# Patient Record
Sex: Female | Born: 1960 | Race: White | Hispanic: Yes | Marital: Married | Smoking: Former smoker
Health system: Southern US, Community
[De-identification: ages and names within clinical notes are randomized; demographics above are authoritative.]

## PROBLEM LIST (undated history)

## (undated) DIAGNOSIS — J439 Emphysema, unspecified: Secondary | ICD-10-CM

## (undated) DIAGNOSIS — B359 Dermatophytosis, unspecified: Secondary | ICD-10-CM

## (undated) DIAGNOSIS — E559 Vitamin D deficiency, unspecified: Secondary | ICD-10-CM

## (undated) DIAGNOSIS — I10 Essential (primary) hypertension: Secondary | ICD-10-CM

## (undated) DIAGNOSIS — F520 Hypoactive sexual desire disorder: Secondary | ICD-10-CM

## (undated) DIAGNOSIS — K829 Disease of gallbladder, unspecified: Secondary | ICD-10-CM

## (undated) DIAGNOSIS — N950 Postmenopausal bleeding: Secondary | ICD-10-CM

## (undated) HISTORY — PX: TUBAL LIGATION: SHX77

## (undated) HISTORY — DX: Postmenopausal bleeding: N95.0

## (undated) HISTORY — DX: Vitamin D deficiency, unspecified: E55.9

## (undated) HISTORY — PX: TONSILLECTOMY: SUR1361

## (undated) HISTORY — DX: Hypoactive sexual desire disorder: F52.0

## (undated) HISTORY — DX: Dermatophytosis, unspecified: B35.9

## (undated) HISTORY — DX: Emphysema, unspecified: J43.9

## (undated) HISTORY — DX: Disease of gallbladder, unspecified: K82.9

---

## 2004-12-20 ENCOUNTER — Ambulatory Visit: Payer: Self-pay

## 2005-12-22 ENCOUNTER — Ambulatory Visit: Payer: Self-pay

## 2007-08-09 ENCOUNTER — Ambulatory Visit: Payer: Self-pay

## 2009-05-08 ENCOUNTER — Ambulatory Visit: Payer: Self-pay

## 2010-10-06 ENCOUNTER — Ambulatory Visit: Payer: Self-pay

## 2012-06-14 ENCOUNTER — Ambulatory Visit: Payer: Self-pay | Admitting: Family Medicine

## 2012-07-02 ENCOUNTER — Emergency Department: Payer: Self-pay | Admitting: Emergency Medicine

## 2012-09-19 ENCOUNTER — Ambulatory Visit: Payer: Self-pay | Admitting: Obstetrics and Gynecology

## 2012-11-07 ENCOUNTER — Emergency Department: Payer: Self-pay | Admitting: Emergency Medicine

## 2014-04-28 ENCOUNTER — Ambulatory Visit: Payer: BC Managed Care – PPO | Admitting: Podiatry

## 2014-05-09 ENCOUNTER — Ambulatory Visit (INDEPENDENT_AMBULATORY_CARE_PROVIDER_SITE_OTHER): Payer: BC Managed Care – PPO

## 2014-05-09 ENCOUNTER — Encounter: Payer: Self-pay | Admitting: Podiatry

## 2014-05-09 ENCOUNTER — Ambulatory Visit (INDEPENDENT_AMBULATORY_CARE_PROVIDER_SITE_OTHER): Payer: BC Managed Care – PPO | Admitting: Podiatry

## 2014-05-09 VITALS — BP 145/95 | HR 88 | Resp 16

## 2014-05-09 DIAGNOSIS — Q667 Congenital pes cavus: Secondary | ICD-10-CM | POA: Diagnosis not present

## 2014-05-09 DIAGNOSIS — M2041 Other hammer toe(s) (acquired), right foot: Secondary | ICD-10-CM

## 2014-05-09 DIAGNOSIS — M216X9 Other acquired deformities of unspecified foot: Secondary | ICD-10-CM

## 2014-05-09 NOTE — Progress Notes (Signed)
Subjective:     Patient ID: Deanna Velez, female   DOB: [birth year], 54 y.o.   MRN: [id number]  HPI patient presents stating she's having significant movement of her second toe right foot with pain that's occurring in the joint secondary to the position of the toe. States that her dog between her second and third toes year ago and it's progressively been moving since that   Review of Systems  All other systems reviewed and are negative.      Objective:   Physical Exam  Constitutional: She is oriented to person, place, and time.  Cardiovascular: Intact distal pulses.   Musculoskeletal: Normal range of motion.  Neurological: She is oriented to person, place, and time.  Skin: Skin is warm.  Nursing note and vitals reviewed.  neurovascular status intact with muscle strength adequate and range of motion subtalar and midtarsal joint within normal limits. Patient's noted to have good digital perfusion is well oriented 3 with no equinus condition noted. Patient has medial and dorsal dislocation of the second toe right with dorsal redness and keratotic lesion secondary to lifting of the toe and pain in the second metatarsophalangeal joint when pressed. This is been progressive for the last year     Assessment:     Probable flexor plate stretch or tear of the second MPJ right with dorsal and medial dislocation of the second toe present with pain and deformity    Plan:     H&P and condition reviewed with patient along with x-ray. Discussed at great length the causes of this condition and discussed treatment options. Due to the fact the toe continues to move and it's becoming more and more of an issue for her she wants to have it fixed versus anything conservative. She's tried shoe gear modifications and padding without relief. Today I discussed digital fusion with metatarsal osteotomy and explained the procedure and the fact there is no guarantee this will give her good alignment. I discussed  alternative treatments and complications with this type of surgery and she understands all of this and is willing to accept risk and signs consent form after review. Patient is scheduled for outpatient surgery in the next several weeks and is encouraged to call with questions and is dispensed air fracture walker at this time

## 2014-05-09 NOTE — Progress Notes (Signed)
   Subjective:    Patient ID: Deanna Velez, female    DOB: [birth year], 54 y.o.   MRN: [id number]  HPI Comments: "I have pain in this toe"  Patient c/o aching 2nd toe right for a few months. Her dog broke this toe a year ago. Since it has been curled. Uncomfortable with shoes. Walking she notices she grasp the floor. No home treatment.  Also, questions about 1st nail right-treatment?     Review of Systems  Musculoskeletal: Positive for gait problem.  Allergic/Immunologic: Positive for food allergies.  All other systems reviewed and are negative.      Objective:   Physical Exam        Assessment & Plan:

## 2014-05-23 HISTORY — PX: TOE SURGERY: SHX1073

## 2014-06-16 ENCOUNTER — Telehealth: Payer: Self-pay | Admitting: *Deleted

## 2014-06-16 NOTE — Telephone Encounter (Signed)
Deanna Velez called stating that she is having surgery tomorrow with dr Paulla Dolly , she is wondering if she can have her prescriptions today to have filled and ready for tomorrow ?

## 2014-06-17 ENCOUNTER — Telehealth: Payer: Self-pay | Admitting: *Deleted

## 2014-06-17 DIAGNOSIS — M2041 Other hammer toe(s) (acquired), right foot: Secondary | ICD-10-CM | POA: Diagnosis not present

## 2014-06-17 DIAGNOSIS — M21541 Acquired clubfoot, right foot: Secondary | ICD-10-CM | POA: Diagnosis not present

## 2014-06-17 NOTE — Telephone Encounter (Signed)
Patient called and stated she had surgery with dr Paulla Dolly this morning and her foot feels like it is swelling and the boot is too tight. Called her back and she stated that she has been icing it , but had to do a few things when she got home from , she has been keeping it up and icing for fifteen minutes, also loosened up the bandage.  She is to stay off her feet and continue with elevation and icing . Loosen up the boot and bandage. Going to take ibuprofen in between her pain medication doses

## 2014-06-20 ENCOUNTER — Ambulatory Visit (INDEPENDENT_AMBULATORY_CARE_PROVIDER_SITE_OTHER): Payer: BC Managed Care – PPO | Admitting: Podiatry

## 2014-06-20 ENCOUNTER — Ambulatory Visit (INDEPENDENT_AMBULATORY_CARE_PROVIDER_SITE_OTHER): Payer: BC Managed Care – PPO

## 2014-06-20 ENCOUNTER — Encounter: Payer: Self-pay | Admitting: Podiatry

## 2014-06-20 VITALS — BP 137/98 | HR 77 | Resp 16

## 2014-06-20 DIAGNOSIS — M2041 Other hammer toe(s) (acquired), right foot: Secondary | ICD-10-CM | POA: Diagnosis not present

## 2014-06-21 NOTE — Progress Notes (Signed)
Subjective:     Patient ID: Deanna Velez, female   DOB: 01-30-1961, 54 y.o.   MRN: [id number]  HPI patient states I'm doing well with my right foot but it does feel like it swells and he gets painful if I been on it for periods of time   Review of Systems     Objective:   Physical Exam Neurovascular status intact muscle strength adequate with well-healing surgical sites right foot of 10 day duration. Wound edges are coapted well and the second toe is in good alignment with pin in place and minimal dorsiflexion of the second toe noted    Assessment:     Doing well post osteotomy second metatarsal digital fusion digit 2 right    Plan:     Reviewed x-rays and explained the importance of plantar flexing the second toe and dispensed digital splint. Continue with elevation compression immobilization and reappoint in approximately 3 weeks or earlier if any issues should occur

## 2014-07-04 ENCOUNTER — Ambulatory Visit (INDEPENDENT_AMBULATORY_CARE_PROVIDER_SITE_OTHER): Payer: BC Managed Care – PPO | Admitting: Podiatry

## 2014-07-04 ENCOUNTER — Encounter: Payer: BC Managed Care – PPO | Admitting: Podiatry

## 2014-07-04 ENCOUNTER — Ambulatory Visit (INDEPENDENT_AMBULATORY_CARE_PROVIDER_SITE_OTHER): Payer: BC Managed Care – PPO

## 2014-07-04 ENCOUNTER — Other Ambulatory Visit: Payer: Self-pay | Admitting: Podiatry

## 2014-07-04 DIAGNOSIS — M2042 Other hammer toe(s) (acquired), left foot: Secondary | ICD-10-CM

## 2014-07-04 DIAGNOSIS — Q667 Congenital pes cavus: Secondary | ICD-10-CM

## 2014-07-04 DIAGNOSIS — M216X9 Other acquired deformities of unspecified foot: Secondary | ICD-10-CM

## 2014-07-04 MED ORDER — IBUPROFEN 800 MG PO TABS: 800.0000 mg | ORAL_TABLET | Freq: Three times a day (TID) | ORAL | Status: DC | PRN

## 2014-07-04 MED ORDER — HYDROCODONE-ACETAMINOPHEN 10-325 MG PO TABS: 1.0000 | ORAL_TABLET | Freq: Three times a day (TID) | ORAL | Status: DC | PRN

## 2014-07-06 NOTE — Progress Notes (Signed)
Subjective:     Patient ID: Deanna Velez, female   DOB: 07-16-1960, 54 y.o.   MRN: [id number]  HPI patient presents stating that I'm doing okay with my left foot with the pin coming out of the second toe but overall I feel good with minimal discomfort   Review of Systems     Objective:   Physical Exam Neurovascular status intact no other health history changes noted with pin that is partially out second toe left foot with good alignment still noted of the underlying foot    Assessment:     Reviewed condition and discussed the pins can get loose over a period of 3-5 weeks    Plan:     Removed pin from the second digit applied sterile dressing and advised on keeping the toe in a plantarflexed position. Patient's x-rays were reviewed and she will be seen back in about 4 weeks

## 2014-07-18 ENCOUNTER — Ambulatory Visit (INDEPENDENT_AMBULATORY_CARE_PROVIDER_SITE_OTHER): Payer: BC Managed Care – PPO | Admitting: Podiatry

## 2014-07-18 ENCOUNTER — Encounter: Payer: Self-pay | Admitting: Podiatry

## 2014-07-18 ENCOUNTER — Ambulatory Visit (INDEPENDENT_AMBULATORY_CARE_PROVIDER_SITE_OTHER): Payer: BC Managed Care – PPO

## 2014-07-18 ENCOUNTER — Ambulatory Visit: Payer: BC Managed Care – PPO | Admitting: Podiatry

## 2014-07-18 VITALS — BP 153/93 | HR 80 | Resp 16

## 2014-07-18 DIAGNOSIS — Z9889 Other specified postprocedural states: Secondary | ICD-10-CM

## 2014-07-18 DIAGNOSIS — M2042 Other hammer toe(s) (acquired), left foot: Secondary | ICD-10-CM

## 2014-07-18 LAB — BASIC METABOLIC PANEL: GLUCOSE: 81 mg/dL

## 2014-07-18 LAB — LIPID PANEL
Cholesterol: 192 mg/dL (ref 0–200)
HDL: 57 mg/dL (ref 35–70)
LDL Cholesterol: 111 mg/dL
Triglycerides: 122 mg/dL (ref 40–160)

## 2014-07-18 LAB — HM PAP SMEAR: HM Pap smear: NEGATIVE

## 2014-07-18 LAB — HEMOGLOBIN A1C: Hgb A1c MFr Bld: 5.6 % (ref 4.0–6.0)

## 2014-07-18 LAB — TSH: TSH: 0.67 u[IU]/mL (ref <=5.90)

## 2014-07-18 NOTE — Progress Notes (Signed)
Subjective:     Patient ID: Deanna Velez, female   DOB: August 10, 1960, 54 y.o.   MRN: [id number]  HPI patient states I was concerned because her small knot on top of my foot and I want to make sure the bone has not moved but overall I'm healing fine   Review of Systems     Objective:   Physical Exam Neurovascular status intact muscle strength adequate with excellent alignment of the second digit second metatarsal with minimal edema and no current indication of nodule no erythema no drainage noted    Assessment:     Doing well post osteotomy second metatarsal digital fusion    Plan:     H&P and x-rays reviewed. Patient's doing very well may gradually return soft shoe gear was given instructions on continuing to lower the toe. Reappoint to recheck

## 2014-07-22 ENCOUNTER — Other Ambulatory Visit: Payer: Self-pay | Admitting: Obstetrics and Gynecology

## 2014-07-22 DIAGNOSIS — Z1231 Encounter for screening mammogram for malignant neoplasm of breast: Secondary | ICD-10-CM

## 2014-07-25 ENCOUNTER — Ambulatory Visit: Payer: BC Managed Care – PPO | Admitting: Podiatry

## 2014-07-30 ENCOUNTER — Ambulatory Visit
Admission: RE | Admit: 2014-07-30 | Discharge: 2014-07-30 | Disposition: A | Discharge status: Home / Self Care | Payer: BC Managed Care – PPO | Source: Ambulatory Visit | Attending: Obstetrics and Gynecology | Admitting: Obstetrics and Gynecology

## 2014-07-30 DIAGNOSIS — Z1231 Encounter for screening mammogram for malignant neoplasm of breast: Secondary | ICD-10-CM | POA: Insufficient documentation

## 2014-07-30 NOTE — Progress Notes (Signed)
Quick Note:  Please let her know i have reviewed her MMG and it is normal ______

## 2014-07-30 NOTE — Progress Notes (Signed)
Notified pt of results 

## 2014-08-01 ENCOUNTER — Ambulatory Visit (INDEPENDENT_AMBULATORY_CARE_PROVIDER_SITE_OTHER): Payer: BC Managed Care – PPO | Admitting: Obstetrics and Gynecology

## 2014-08-01 ENCOUNTER — Ambulatory Visit: Payer: BC Managed Care – PPO | Admitting: Podiatry

## 2014-08-01 ENCOUNTER — Encounter: Payer: Self-pay | Admitting: Obstetrics and Gynecology

## 2014-08-01 ENCOUNTER — Encounter: Payer: Self-pay | Admitting: *Deleted

## 2014-08-01 DIAGNOSIS — E669 Obesity, unspecified: Secondary | ICD-10-CM | POA: Diagnosis not present

## 2014-08-01 DIAGNOSIS — R6882 Decreased libido: Secondary | ICD-10-CM

## 2014-08-01 MED ORDER — CYANOCOBALAMIN 1000 MCG/ML IJ SOLN: 1000.0000 ug | Freq: Once | INTRAMUSCULAR | Status: DC

## 2014-08-01 MED ORDER — CYANOCOBALAMIN 1000 MCG/ML IJ SOLN
1000.0000 ug | Freq: Once | INTRAMUSCULAR | Status: AC
  Administered 2014-08-01: 1000 ug via INTRAMUSCULAR

## 2014-08-01 MED ORDER — PHENTERMINE HCL 37.5 MG PO TABS: 37.5000 mg | ORAL_TABLET | Freq: Every day | ORAL | Status: DC

## 2014-08-01 NOTE — Progress Notes (Signed)
Patient ID: Deanna Velez, female   DOB: 02-16-1961, 54 y.o.   MRN: [id number] Pt came in for b-12 inj, inj given unable to close chart, melody will close chart

## 2014-08-02 LAB — ESTRADIOL: Estradiol: <5 pg/mL

## 2014-08-02 LAB — PROGESTERONE: Progesterone: 0.6 ng/mL

## 2014-08-03 LAB — TESTOSTERONE, FREE, TOTAL, SHBG: Testosterone, Free: 3.6 pg/mL (ref 0.0–4.2)

## 2014-08-05 ENCOUNTER — Other Ambulatory Visit: Payer: Self-pay | Admitting: Obstetrics and Gynecology

## 2014-08-05 DIAGNOSIS — Z79890 Hormone replacement therapy (postmenopausal): Secondary | ICD-10-CM

## 2014-08-05 MED ORDER — ESTRADIOL 0.5 MG PO TABS: 0.5000 mg | ORAL_TABLET | Freq: Every day | ORAL | Status: DC

## 2014-08-05 MED ORDER — PROGESTERONE MICRONIZED 200 MG PO CAPS: 200.0000 mg | ORAL_CAPSULE | Freq: Every day | ORAL | Status: DC

## 2014-08-05 NOTE — Progress Notes (Signed)
Quick Note:  Please let her know her hormones levels were low for estrogen and progesterone, but normal testoterone- I have sent in rx for both that were low, she is to take both at night, with not taking prometrium last week of each month, ______

## 2014-09-05 ENCOUNTER — Ambulatory Visit (INDEPENDENT_AMBULATORY_CARE_PROVIDER_SITE_OTHER): Payer: BC Managed Care – PPO | Admitting: Obstetrics and Gynecology

## 2014-09-05 VITALS — BP 157/86 | HR 99 | Ht 60.0 in | Wt 147.6 lb

## 2014-09-05 DIAGNOSIS — R634 Abnormal weight loss: Secondary | ICD-10-CM

## 2014-09-05 MED ORDER — CYANOCOBALAMIN 1000 MCG/ML IJ SOLN
1000.0000 ug | Freq: Once | INTRAMUSCULAR | Status: AC
  Administered 2014-09-05: 1000 ug via INTRAMUSCULAR

## 2014-09-05 NOTE — Progress Notes (Cosign Needed)
Patient ID: Deanna Velez, female   DOB: 1960/03/06, 54 y.o.   MRN: [id number] Weight loss of 7.6 lbs in past 6 months.  B/P 157/86. Pt states she is under a lot of stress with family issues and feels like this is why her B/P is up. Also had problems with getting her B-12 medication because of insurance, so this time in house B12 used. Will pick up at pharmacy before next injection, pt tried to pick up too early the first time. If any problems, will let us know. No c/o side effects of medication: Phentermine or B12 injection.

## 2014-10-08 ENCOUNTER — Ambulatory Visit (INDEPENDENT_AMBULATORY_CARE_PROVIDER_SITE_OTHER): Payer: BC Managed Care – PPO | Admitting: Obstetrics and Gynecology

## 2014-10-08 ENCOUNTER — Encounter: Payer: Self-pay | Admitting: Obstetrics and Gynecology

## 2014-10-08 VITALS — BP 144/102 | HR 98 | Ht 60.0 in | Wt 150.4 lb

## 2014-10-08 DIAGNOSIS — N950 Postmenopausal bleeding: Secondary | ICD-10-CM

## 2014-10-08 NOTE — Progress Notes (Signed)
Subjective:     Patient ID: Deanna Velez, female   DOB: Nov 21, 1960, 54 y.o.   MRN: [id number]  HPI Vaginal spotting x 1 day after 8 years of being menopausal  Review of Systems Reports onset of dark red spotting x 1 day on 10/05/14, denies pain or urinary s/s, no relationship to intercourse. None since then.    Objective:   Physical Exam A&O x4 Well groomed female- slightly anxious Pelvic exam: normal external genitalia, vulva, vagina, cervix, uterus and adnexa.    Assessment:     PMB of unknown etiology     Plan:     Pelvic ultrasound in 2-3 weeks with possible endometrial biopsy if indicated.  Counseled on causes of PMB and testing.  Wendelin Reader Trudee Kuster, CNM

## 2014-10-08 NOTE — Patient Instructions (Signed)
Postmenopausal Bleeding  Postmenopausal bleeding is any bleeding a woman has after she has entered into menopause. Menopause is the end of a woman's fertile years. After menopause, a woman no longer ovulates or has menstrual periods.   Postmenopausal bleeding can be caused by various things. Any type of postmenopausal bleeding, even if it appears to be a typical menstrual period, is concerning. This should be evaluated by your health care provider. Any treatment will depend on the cause of the bleeding.  HOME CARE INSTRUCTIONS  Monitor your condition for any changes. The following actions may help to alleviate any discomfort you are experiencing:  · Avoid the use of tampons and douches as directed by your health care provider.   · Change your pads frequently.  · Get regular pelvic exams and Pap tests.  · Keep all follow-up appointments for diagnostic tests as directed by your health care provider.  SEEK MEDICAL CARE IF:   · Your bleeding lasts more than 1 week.  · You have abdominal pain.  · You have bleeding with sexual intercourse.  SEEK IMMEDIATE MEDICAL CARE IF:   · You have a fever, chills, headache, dizziness, muscle aches, and bleeding.  · You have severe pain with bleeding.  · You are passing blood clots.  · You have bleeding and need more than 1 pad an hour.  · You feel faint.  MAKE SURE YOU:  · Understand these instructions.  · Will watch your condition.  · Will get help right away if you are not doing well or get worse.  Document Released: 05/18/2005 Document Revised: 11/28/2012 Document Reviewed: 09/06/2012  Exit[employer]® Patient Information ©2015 [employer]. This information is not intended to replace advice given to you by your health care provider. Make sure you discuss any questions you have with your health care provider.

## 2014-10-17 ENCOUNTER — Ambulatory Visit (INDEPENDENT_AMBULATORY_CARE_PROVIDER_SITE_OTHER): Payer: BC Managed Care – PPO | Admitting: Obstetrics and Gynecology

## 2014-10-17 ENCOUNTER — Encounter: Payer: Self-pay | Admitting: Obstetrics and Gynecology

## 2014-10-17 ENCOUNTER — Ambulatory Visit: Payer: BC Managed Care – PPO

## 2014-10-17 VITALS — BP 140/96 | HR 86 | Ht 60.0 in | Wt 146.7 lb

## 2014-10-17 DIAGNOSIS — D251 Intramural leiomyoma of uterus: Secondary | ICD-10-CM | POA: Diagnosis not present

## 2014-10-17 DIAGNOSIS — N950 Postmenopausal bleeding: Secondary | ICD-10-CM | POA: Diagnosis not present

## 2014-10-17 NOTE — Patient Instructions (Signed)

## 2014-10-17 NOTE — Progress Notes (Signed)
Patient ID: Sherley Bounds, female   DOB: [birth year], 54 y.o.   MRN: [id number] Here for ultrasound for PMB:  Indications:PMB Findings:  The uterus measures 6.7 x 2.6 x 4.2  cm. Echo texture is homogenous with evidence of focal masses. Within the uterus are multiple suspected fibroids measuring: Fibroid 1: Left fundal, 1.7 x 1.5 x 1.4 cm  The Endometrium measures 2.2 mm.  Right Ovary measures 1.3 x 1.3 x 1.2 cm. It is normal in appearance. Left Ovary measures 1.7 x 1.1 x 1.0 cm. It is normal appearance. Survey of the adnexa demonstrates no adnexal masses. There is no free fluid in the cul de sac.  Impression: 1. Single fibroid seen at the left fundus of the uterus.  Recommendations: 1.Clinical correlation with the patient's History and Physical Exam.  Leane Para, Rad Tech    Scan reviewed and agree with findings. Reviewed with patient, will proceed with expectant management.  Melody Trudee Kuster, CNM

## 2014-11-05 ENCOUNTER — Ambulatory Visit (INDEPENDENT_AMBULATORY_CARE_PROVIDER_SITE_OTHER): Payer: BC Managed Care – PPO | Admitting: Obstetrics and Gynecology

## 2014-11-05 VITALS — BP 130/85 | HR 86 | Ht 60.0 in | Wt 149.0 lb

## 2014-11-05 DIAGNOSIS — E669 Obesity, unspecified: Secondary | ICD-10-CM

## 2014-11-05 MED ORDER — CYANOCOBALAMIN 1000 MCG/ML IJ SOLN
1000.0000 ug | Freq: Once | INTRAMUSCULAR | Status: AC
  Administered 2014-11-05: 1000 ug via INTRAMUSCULAR

## 2014-11-05 NOTE — Progress Notes (Cosign Needed)
Patient ID: Deanna Velez, female   DOB: [birth year], 54 y.o.   MRN: [id number] Pt presents for weight, B/P, B-12 injection. No side effects of medication-Phentermine, or B-12.  Weight gain of  2.3 lbs. Encouraged eating healthy and exercise. Pt states she has started walking track at [facility] 5x week. B/P down to 130/85.

## 2014-11-18 ENCOUNTER — Other Ambulatory Visit: Payer: Self-pay | Admitting: Obstetrics and Gynecology

## 2014-11-18 ENCOUNTER — Telehealth: Payer: Self-pay | Admitting: Obstetrics and Gynecology

## 2014-11-18 NOTE — Telephone Encounter (Signed)
Left detailed message for pt she will need appt for refill

## 2014-11-18 NOTE — Telephone Encounter (Signed)
Patient called requesting a refill on phentermine. She also wanted you to know that she is doing great with it and has lost 6 pounds.Thanks

## 2014-12-10 ENCOUNTER — Ambulatory Visit: Payer: BC Managed Care – PPO

## 2015-01-06 ENCOUNTER — Encounter: Payer: Self-pay | Admitting: Obstetrics and Gynecology

## 2015-01-06 ENCOUNTER — Ambulatory Visit (INDEPENDENT_AMBULATORY_CARE_PROVIDER_SITE_OTHER): Payer: BC Managed Care – PPO | Admitting: Obstetrics and Gynecology

## 2015-01-06 VITALS — BP 139/94 | HR 88 | Ht 60.0 in | Wt 144.2 lb

## 2015-01-06 DIAGNOSIS — N950 Postmenopausal bleeding: Secondary | ICD-10-CM | POA: Diagnosis not present

## 2015-01-06 NOTE — Progress Notes (Signed)
Subjective:     Patient ID: Deanna Velez, female   DOB: 04/20/1960, 54 y.o.   MRN: [id number]  HPI Pelvic and left groin pressure x 1 month with dark brown spotting yesterday and today, previously seen for PMB in Aug and found to have small fibroid in uterus.  Review of Systems See above    Objective:   Physical Exam A&O x4, slightly anxious Abdomen soft and nontender Pelvic exam: normal external genitalia, vulva, vagina, cervix, uterus and adnexa, scant dark red blood noted at os. .Endometrial Biopsy Procedure Note  Pre-operative Diagnosis: PMB  Post-operative Diagnosis: same  Indications: postmenopausal bleeding  Procedure Details   Urine pregnancy test was not done.  The risks (including infection, bleeding, pain, and uterine perforation) and benefits of the procedure were explained to the patient and Verbal informed consent was obtained.  Antibiotic prophylaxis against endocarditis was not indicated.   The patient was placed in the dorsal lithotomy position.  Bimanual exam showed the uterus to be in the retroflexed position.  A Graves' speculum inserted in the vagina, and the cervix prepped with povidone iodine.  Endocervical curettage with a Kevorkian curette was performed.   A sharp tenaculum was applied to the posterior lip of the cervix for stabilization.  A sterile uterine sound was used to sound the uterus to a depth of 7.5cm.  A Pipelle endometrial aspirator was used to sample the endometrium.  Sample was sent for pathologic examination.  Condition: Stable  Complications: None  Plan:  The patient was advised to call for any fever or for prolonged or severe pain or bleeding. She was advised to use NSAID as needed for mild to moderate pain. She was advised to avoid vaginal intercourse for 48 hours or until the bleeding has completely stopped.    Assessment:     PMB episode 2     Plan:     Endometrial biopsy obtained, will proceed accordingly  May consider D&C under  anesthesia if persist due to traumatic in office D&C in past.  Gennie Alma, CNM

## 2015-01-13 ENCOUNTER — Encounter: Payer: Self-pay | Admitting: Obstetrics and Gynecology

## 2015-01-21 ENCOUNTER — Encounter: Payer: Self-pay | Admitting: Obstetrics and Gynecology

## 2015-01-21 ENCOUNTER — Ambulatory Visit (INDEPENDENT_AMBULATORY_CARE_PROVIDER_SITE_OTHER): Payer: BC Managed Care – PPO | Admitting: Obstetrics and Gynecology

## 2015-01-21 VITALS — BP 139/86 | HR 69 | Ht 60.0 in | Wt 148.4 lb

## 2015-01-21 DIAGNOSIS — N950 Postmenopausal bleeding: Secondary | ICD-10-CM

## 2015-01-21 DIAGNOSIS — R102 Pelvic and perineal pain: Secondary | ICD-10-CM

## 2015-01-21 NOTE — Progress Notes (Signed)
Patient ID: Deanna Velez, female   DOB: January 24, 1961, 54 y.o.   MRN: [id number] Discuss d&c per mnb pmb x 2  See emb results  Chief complaint: 1.  Postmenopausal bleeding. 2.  Pelvic pain. 3.  History of traumatic gynecologic procedures.  The patient is a 54 year old female, Para 2, 0-2, menopausal 6 years, with recent episodes of postmenopausal bleeding 2.  In August 2016.  Patient had pelvic ultrasound that demonstrated a 1.7 cm fibroid in the fundus of the uterus along with an endometrial stripe that measured 2.2 mm.  No endometrial biopsy was performed.  More recently, the patient had another episode of postmenopausal bleeding.  Endometrial biopsy in the office was performed and was considered insufficient due to only sampling endocervical cells. Patient is unwilling to proceed with another in office procedures.  At this time because of a past history of trauma in the office with need for D&C and IUD complication, with perforation.  Pelvic pain is described as a heaviness in the pelvis.  It tends to increase in intensity.  When the patient is tired after she has been on her feet all day at work.  Ibuprofen tends to help diminish her discomfort.  She does not have any dyspareunia.  She does not have any past history of severe dysmenorrhea, or history of endometriosis.  GI function is normal.  Over the past 3 months with having at least 2 bowel movements a day of loose stool.  She denies melena or bright red blood per rectum.  She has never had a colonoscopy.  There is no family history of colon cancer.  GU function is normal with the exception of some mild stress incontinence and mild urge incontinence for which she does not have to use pads.  She does void approximately 4 times a day and does not experiencing any nocturia.  Past medical history, past surgical history, problem list, medications, and allergies are reviewed.  Review of systems: Per HPI.  OBJECTIVE: BP 139/86 mmHg  Pulse 69  Ht  5' (1.524 m)  Wt 148 lb 6.4 oz (67.314 kg)  BMI 28.98 kg/m2 Pleasant, well-appearing female in no acute distress.  She is alert and oriented. Back: Without CVA tenderness. Abdomen: Soft, nontender, without organomegaly. Pelvic exam: External genitalia-normal. BUS-normal. Vagina-normal without significant discharge.;  Good vault support. Uterus-normal size and shape, mobile, nontender. Adnexa-nonpalpable and nontender. Rectovaginal exam-normal.  External exam; normal sphincter tone,; no rectal masses.  ASSESSMENT: 1.  2 episodes of postmenopausal bleeding within the past 4 months. 2.  Pelvic ultrasound notable for fundal uterine fibroid 1.7 cm, not likely contributing to patient's symptomatology. 3.  Pelvic ultrasound notable for a 2.2 mm endometrial stripe; no endometrial biopsy necessary within 6 months from August 2016. 4.  Pelvic pain, unclear etiology, not likely related to gynecologic issues. 5.  Change in bowel function with loose stools; no history of GI workup; no history of colonoscopy. 6.  No evidence of GU etiology to her symptoms.  PLAN: 1.  Maintain menstrual calendar, monitoring 2.  Return for pelvic ultrasound in January 2017. 3.  Follow-up with me one week after ultrasound. 4.  Obtain interval GI consultation.  A total of 25 minutes were spent face-to-face with the patient during this encounter and over half of that time involved counseling and coordination of care.  Brayton Mars, MD  Note: This dictation was prepared with Dragon dictation along with smaller phrase technology. Any transcriptional errors that result from this process are unintentional.

## 2015-01-21 NOTE — Patient Instructions (Signed)
1.  Monitor bleeding with menstrual calendar monitor. 2.  Ultrasound is scheduled for mid January. 3.  Follow-up with me in one week after her ultrasound.

## 2015-02-24 ENCOUNTER — Ambulatory Visit: Payer: BC Managed Care – PPO | Admitting: Unknown Physician Specialty

## 2015-02-27 ENCOUNTER — Encounter: Payer: Self-pay | Admitting: Unknown Physician Specialty

## 2015-02-27 ENCOUNTER — Ambulatory Visit (INDEPENDENT_AMBULATORY_CARE_PROVIDER_SITE_OTHER): Payer: BC Managed Care – PPO | Admitting: Unknown Physician Specialty

## 2015-02-27 VITALS — BP 148/92 | HR 81 | Temp 98.8°F | Ht 59.6 in | Wt 149.4 lb

## 2015-02-27 DIAGNOSIS — I10 Essential (primary) hypertension: Secondary | ICD-10-CM | POA: Diagnosis not present

## 2015-02-27 DIAGNOSIS — Z0000 Encounter for general adult medical examination without abnormal findings: Secondary | ICD-10-CM | POA: Diagnosis not present

## 2015-02-27 DIAGNOSIS — R1011 Right upper quadrant pain: Secondary | ICD-10-CM

## 2015-02-27 DIAGNOSIS — Z23 Encounter for immunization: Secondary | ICD-10-CM

## 2015-02-27 DIAGNOSIS — K529 Noninfective gastroenteritis and colitis, unspecified: Secondary | ICD-10-CM | POA: Diagnosis not present

## 2015-02-27 DIAGNOSIS — B359 Dermatophytosis, unspecified: Secondary | ICD-10-CM

## 2015-02-27 DIAGNOSIS — N950 Postmenopausal bleeding: Secondary | ICD-10-CM

## 2015-02-27 DIAGNOSIS — M722 Plantar fascial fibromatosis: Secondary | ICD-10-CM | POA: Diagnosis not present

## 2015-02-27 NOTE — Assessment & Plan Note (Addendum)
High here but good numbers at home.  She would like to lose weight

## 2015-02-27 NOTE — Progress Notes (Signed)
BP 148/92 mmHg  Pulse 81  Temp(Src) 98.8 F (37.1 C)  Ht 4' 11.6" (1.514 m)  Wt 149 lb 6.4 oz (67.767 kg)  BMI 29.56 kg/m2  SpO2 98%  LMP  (LMP Unknown)   Subjective:    Patient ID: Deanna Velez, female    DOB: [birth year], 55 y.o.   MRN: [id number]  HPI: Deanna Velez is a 55 y.o. female  Chief Complaint  Patient presents with  . Annual Exam    pt states she feels like she is having a gallbladder attack, heels spurs, and sciatica   Pt is here for a physical.  Sees OB for post menopausal bleeding.  She got a referral to see GI but would like to see [place] first for further evaluation. She is complaining of RUQ abdominal pain.  States it is an "annoying pain" which comes and goes.  Worse when she eats too much but not sure of specific foods.  Had a bad episode before the holidays.  Has frequent soft BMs 6 times a day.  Describes them as soft.  No nausea and vomiting but complains of heart burn.  Dr. Enzo Bi, who is following for post menopausal bleeding, is doing a pelvic US and recommending a GI referral  She does admit to a great deal of stress in the last 2 years  Ringworm Right arm.  Wants to be treated.    Heel spurs Thinks it's related to weight.  She would like to lose weight.  She does use arch supports.    Relevant past medical, surgical, family and social history reviewed and updated as indicated. Interim medical history since our last visit reviewed. Allergies and medications reviewed and updated.  Review of Systems  Constitutional: Negative.   HENT: Negative.   Eyes: Negative.   Respiratory: Negative.   Cardiovascular: Negative.   Gastrointestinal:       As above  Endocrine: Negative.   Genitourinary: Negative.   Musculoskeletal: Negative.   Psychiatric/Behavioral: Negative.     Per HPI unless specifically indicated above     Objective:    BP 148/92 mmHg  Pulse 81  Temp(Src) 98.8 F (37.1 C)  Ht 4' 11.6" (1.514 m)  Wt 149 lb 6.4 oz (67.767 kg)  BMI  29.56 kg/m2  SpO2 98%  LMP  (LMP Unknown)  Wt Readings from Last 3 Encounters:  02/27/15 149 lb 6.4 oz (67.767 kg)  01/21/15 148 lb 6.4 oz (67.314 kg)  01/06/15 144 lb 3.2 oz (65.409 kg)    Physical Exam  Constitutional: She is oriented to person, place, and time. She appears well-developed and well-nourished.  HENT:  Head: Normocephalic and atraumatic.  Eyes: Pupils are equal, round, and reactive to light. Right eye exhibits no discharge. Left eye exhibits no discharge. No scleral icterus.  Neck: Normal range of motion. Neck supple. Carotid bruit is not present. No thyromegaly present.  Cardiovascular: Normal rate, regular rhythm and normal heart sounds.  Exam reveals no gallop and no friction rub.   No murmur heard. Pulmonary/Chest: Effort normal and breath sounds normal. No respiratory distress. She has no wheezes. She has no rales.  Abdominal: Soft. Bowel sounds are normal. There is no tenderness. There is no rebound.  Genitourinary: No breast swelling, tenderness or discharge.  Musculoskeletal: Normal range of motion.  Lymphadenopathy:    She has no cervical adenopathy.  Neurological: She is alert and oriented to person, place, and time.  Skin: Skin is warm, dry and intact. Lesion noted. No  rash noted.  Right arm raised border with clearing center  Psychiatric: She has a normal mood and affect. Her speech is normal and behavior is normal. Judgment and thought content normal. Cognition and memory are normal.    Results for orders placed or performed in visit on 08/01/14  Testosterone, Free, Total, SHBG  Result Value Ref Range   Testosterone, Free 3.6 0.0 - 4.2 pg/mL  Estradiol  Result Value Ref Range   Estradiol <5.0 pg/mL  Progesterone  Result Value Ref Range   Progesterone 0.6 ng/mL      Assessment & Plan:   Problem List Items Addressed This Visit      Unprioritized   Postmenopausal bleeding    Per Dr Enzo Bi      Plantar fasciitis, bilateral   Hypertension     High here but good numbers at home.  She would like to lose weight       Other Visit Diagnoses    Immunization due    -  Primary    Relevant Orders    Flu Vaccine QUAD 36+ mos IM (Completed)    Ringworm        Rx for Lotrisone    Right upper quadrant pain        Needs abdominal US to evaluate gall bladder    Relevant Orders    CBC with Differential/Platelet    US Abdomen Complete    Frequent stools        GI referral.  also due for colonoscopy    Relevant Orders    US Abdomen Complete    Routine general medical examination at a health care facility        Relevant Orders    Hepatitis C antibody    HIV antibody    CBC with Differential/Platelet    Comprehensive metabolic panel    Lipid Panel w/o Chol/HDL Ratio    TSH        Follow up plan: Return in about 3 months (around 05/28/2015) for results.

## 2015-02-27 NOTE — Assessment & Plan Note (Signed)
Per Dr Enzo Bi

## 2015-02-28 LAB — CBC WITH DIFFERENTIAL/PLATELET
BASOS ABS: 0 10*3/uL (ref 0.0–0.2)
BASOS: 0 %
EOS (ABSOLUTE): 0.2 10*3/uL (ref 0.0–0.4)
Eos: 2 %
Hematocrit: 39.6 % (ref 34.0–46.6)
Hemoglobin: 13 g/dL (ref 11.1–15.9)
IMMATURE GRANS (ABS): 0 10*3/uL (ref 0.0–0.1)
IMMATURE GRANULOCYTES: 0 %
LYMPHS: 34 %
Lymphocytes Absolute: 3 10*3/uL (ref 0.7–3.1)
MCH: 28.3 pg (ref 26.6–33.0)
MCHC: 32.8 g/dL (ref 31.5–35.7)
MCV: 86 fL (ref 79–97)
MONOS ABS: 0.6 10*3/uL (ref 0.1–0.9)
Monocytes: 7 %
NEUTROS PCT: 57 %
Neutrophils Absolute: 4.9 10*3/uL (ref 1.4–7.0)
PLATELETS: 306 10*3/uL (ref 150–379)
RBC: 4.59 x10E6/uL (ref 3.77–5.28)
RDW: 13.4 % (ref 12.3–15.4)
WBC: 8.7 10*3/uL (ref 3.4–10.8)

## 2015-02-28 LAB — COMPREHENSIVE METABOLIC PANEL
A/G RATIO: 2 (ref 1.1–2.5)
ALT: 35 IU/L — AB (ref 0–32)
AST: 35 IU/L (ref 0–40)
Albumin: 4.9 g/dL (ref 3.5–5.5)
Alkaline Phosphatase: 80 IU/L (ref 39–117)
BILIRUBIN TOTAL: 0.2 mg/dL (ref 0.0–1.2)
BUN/Creatinine Ratio: 16 (ref 9–23)
BUN: 13 mg/dL (ref 6–24)
CALCIUM: 10.1 mg/dL (ref 8.7–10.2)
CHLORIDE: 102 mmol/L (ref 96–106)
CO2: 22 mmol/L (ref 18–29)
Creatinine, Ser: 0.81 mg/dL (ref 0.57–1.00)
GFR calc Af Amer: 96 mL/min/{1.73_m2} (ref >59)
GFR, EST NON AFRICAN AMERICAN: 83 mL/min/{1.73_m2} (ref >59)
GLUCOSE: 104 mg/dL — AB (ref 65–99)
Globulin, Total: 2.5 g/dL (ref 1.5–4.5)
POTASSIUM: 4.2 mmol/L (ref 3.5–5.2)
Sodium: 143 mmol/L (ref 134–144)
TOTAL PROTEIN: 7.4 g/dL (ref 6.0–8.5)

## 2015-02-28 LAB — LIPASE: Lipase: 45 U/L (ref 0–59)

## 2015-02-28 LAB — TSH: TSH: 1.12 u[IU]/mL (ref 0.450–4.500)

## 2015-02-28 LAB — LIPID PANEL W/O CHOL/HDL RATIO
Cholesterol, Total: 203 mg/dL — ABNORMAL HIGH (ref 100–199)
HDL: 71 mg/dL (ref >39)
LDL Calculated: 117 mg/dL — ABNORMAL HIGH (ref 0–99)
TRIGLYCERIDES: 76 mg/dL (ref 0–149)
VLDL CHOLESTEROL CAL: 15 mg/dL (ref 5–40)

## 2015-02-28 LAB — AMYLASE: Amylase: 40 U/L (ref 31–124)

## 2015-02-28 LAB — HIV ANTIBODY (ROUTINE TESTING W REFLEX): HIV SCREEN 4TH GENERATION: NONREACTIVE

## 2015-02-28 LAB — HEPATITIS C ANTIBODY

## 2015-03-03 ENCOUNTER — Encounter: Payer: Self-pay | Admitting: Unknown Physician Specialty

## 2015-03-03 NOTE — Progress Notes (Signed)
Quick Note:  Normal labs. Patient notified by letter. ______ 

## 2015-03-05 ENCOUNTER — Ambulatory Visit
Admission: RE | Admit: 2015-03-05 | Discharge: 2015-03-05 | Disposition: A | Discharge status: Home / Self Care | Payer: BC Managed Care – PPO | Source: Ambulatory Visit | Attending: Unknown Physician Specialty | Admitting: Unknown Physician Specialty

## 2015-03-05 DIAGNOSIS — K760 Fatty (change of) liver, not elsewhere classified: Secondary | ICD-10-CM | POA: Insufficient documentation

## 2015-03-05 DIAGNOSIS — R938 Abnormal findings on diagnostic imaging of other specified body structures: Secondary | ICD-10-CM | POA: Insufficient documentation

## 2015-03-05 DIAGNOSIS — K529 Noninfective gastroenteritis and colitis, unspecified: Secondary | ICD-10-CM | POA: Insufficient documentation

## 2015-03-05 DIAGNOSIS — R1011 Right upper quadrant pain: Secondary | ICD-10-CM | POA: Insufficient documentation

## 2015-03-06 ENCOUNTER — Telehealth: Payer: Self-pay | Admitting: Unknown Physician Specialty

## 2015-03-06 DIAGNOSIS — R935 Abnormal findings on diagnostic imaging of other abdominal regions, including retroperitoneum: Secondary | ICD-10-CM

## 2015-03-06 MED ORDER — CLOTRIMAZOLE-BETAMETHASONE 1-0.05 % EX CREA: 1.0000 "application " | TOPICAL_CREAM | Freq: Two times a day (BID) | CUTANEOUS | Status: DC

## 2015-03-06 NOTE — Telephone Encounter (Signed)
Discussed Ultrasound.  Hypoechoic area on pancreas.  Recommended a CT scan of pelvis and abdoman.

## 2015-03-06 NOTE — Telephone Encounter (Signed)
Routing to provider. Patient was seen 02/27/15.

## 2015-03-06 NOTE — Telephone Encounter (Signed)
Pt called stated she needs an RX for ringworm ointment. [facility] in [place]. Thanks.

## 2015-03-11 ENCOUNTER — Ambulatory Visit (INDEPENDENT_AMBULATORY_CARE_PROVIDER_SITE_OTHER): Payer: BC Managed Care – PPO

## 2015-03-11 DIAGNOSIS — N950 Postmenopausal bleeding: Secondary | ICD-10-CM | POA: Diagnosis not present

## 2015-03-17 ENCOUNTER — Ambulatory Visit (INDEPENDENT_AMBULATORY_CARE_PROVIDER_SITE_OTHER): Payer: BC Managed Care – PPO | Admitting: Obstetrics and Gynecology

## 2015-03-17 ENCOUNTER — Encounter: Payer: Self-pay | Admitting: Obstetrics and Gynecology

## 2015-03-17 VITALS — BP 143/90 | HR 102 | Wt 149.5 lb

## 2015-03-17 DIAGNOSIS — N950 Postmenopausal bleeding: Secondary | ICD-10-CM

## 2015-03-17 NOTE — Patient Instructions (Signed)
1  .Maintain menstrual calendar monitor. 2.  Follow-up in 6 months for ultrasound. 3.  Ultrasound of pelvis to assess endometrial stripe-6 months

## 2015-03-17 NOTE — Progress Notes (Signed)
Chief complaint: 1.  Postmenopausal bleeding. 2.  History of fibroids. 3.  Abdominal pain. 4.  Bowel dysfunction.  Patient presents for conference regarding pelvic ultrasound for evaluation of postmenopausal bleeding.  Ultrasound demonstrated several small fibroids, 2 of which were submucosal, possibly linked to the postmenopausal bleeding.  Endometrial stripe was less than 4 mm.  No endometrial biopsy is necessary at this time.  Patient is still having midepigastric and right upper quadrant abdominal pain.  Bowel movements are approximately 10 day and have a [place] pale color assistance.  Recent ultrasounds demonstrated normal gallbladder; however, there was possible elvidence of a pancreatic mass.  CT scan is scheduled.  ASSESSMENT: 1.  Postmenopausal bleeding. 2.  Ultrasound-several submucosal fibroids, small. 3.  Thin endometrial stripe, consistent with menopause. 4.  No endometrial biopsy necessary. 5.  Abnormal abdominal ultrasound with possible pancreatic mass; CT scan pending.  PLAN: 1.  Continue with GI.  Follow-up  Scheduled. 2.  Repeat ultrasound in 6 months. 3.  Continue menstrual calendar monitoring.  A total of 15 minutes were spent face-to-face with the patient during this encounter and over half of that time dealt with counseling and coordination of care.  Note: This dictation was prepared with Dragon dictation along with smaller phrase technology. Any transcriptional errors that result from this process are unintentional.

## 2015-03-18 ENCOUNTER — Ambulatory Visit: Payer: BC Managed Care – PPO | Admitting: Obstetrics and Gynecology

## 2015-03-20 ENCOUNTER — Ambulatory Visit
Admission: RE | Admit: 2015-03-20 | Discharge: 2015-03-20 | Disposition: A | Discharge status: Home / Self Care | Payer: BC Managed Care – PPO | Source: Ambulatory Visit | Attending: Unknown Physician Specialty | Admitting: Unknown Physician Specialty

## 2015-03-20 DIAGNOSIS — R935 Abnormal findings on diagnostic imaging of other abdominal regions, including retroperitoneum: Secondary | ICD-10-CM | POA: Insufficient documentation

## 2015-03-20 DIAGNOSIS — D259 Leiomyoma of uterus, unspecified: Secondary | ICD-10-CM | POA: Diagnosis not present

## 2015-03-20 DIAGNOSIS — N281 Cyst of kidney, acquired: Secondary | ICD-10-CM | POA: Diagnosis not present

## 2015-03-20 MED ORDER — IOHEXOL 350 MG/ML SOLN
100.0000 mL | Freq: Once | INTRAVENOUS | Status: AC | PRN
  Administered 2015-03-20: 100 mL via INTRAVENOUS

## 2015-04-16 ENCOUNTER — Encounter: Payer: Self-pay | Admitting: Obstetrics and Gynecology

## 2015-04-16 ENCOUNTER — Ambulatory Visit (INDEPENDENT_AMBULATORY_CARE_PROVIDER_SITE_OTHER): Payer: BC Managed Care – PPO | Admitting: Obstetrics and Gynecology

## 2015-04-16 VITALS — BP 148/88 | HR 92 | Ht 60.0 in | Wt 152.0 lb

## 2015-04-16 DIAGNOSIS — E663 Overweight: Secondary | ICD-10-CM

## 2015-04-16 DIAGNOSIS — R0981 Nasal congestion: Secondary | ICD-10-CM

## 2015-04-16 MED ORDER — CYANOCOBALAMIN 1000 MCG/ML IJ SOLN: 1000.0000 ug | Freq: Once | INTRAMUSCULAR | Status: DC

## 2015-04-16 MED ORDER — PHENTERMINE HCL 37.5 MG PO TABS: 37.5000 mg | ORAL_TABLET | Freq: Every day | ORAL | Status: DC

## 2015-04-16 NOTE — Progress Notes (Signed)
Subjective:  Deanna Velez is a 55 y.o. No obstetric history on file. at Unknown being seen today for weight loss management- initial visit.  Patient reports General ROS: positive for  - fatigue, weight gain and head congestion x 3-4 days that settled in chest this am, denies fever or color to nasal drainage. and reports previous weight loss attempts:    Past treatment has included: small frequent feedings, nutritional supplement, vitamin supplement, vitamin B-12 injections, appetite stimulant, exercise management and discontinuation of medication.  The following portions of the patient's history were reviewed and updated as appropriate: allergies, current medications, past family history, past medical history, past social history, past surgical history and problem list.   Objective:   Filed Vitals:   04/16/15 0842  BP: 148/88  Pulse: 92  Height: 5' (1.524 m)  Weight: 152 lb (68.947 kg)    General:  Alert, oriented and cooperative. Patient is in no acute distress.  :   :   :   :   :   :   PE: Well groomed female in no current distress,   Mental Status: Normal mood and affect. Normal behavior. Normal judgment and thought content.   Current BMI: Body mass index is 29.69 kg/(m^2).   Assessment and Plan:  Obesity  There are no diagnoses linked to this encounter.  Plan: low carb, High protein diet RX for adipex 37.5 mg daily and B12 1056mcg.ml monthly, to start now with first injection given at today's visit. Reviewed side-effects common to both medications and expected outcomes. Increase daily water intake to at least 8 bottle a day, every day.  Goal is to reduse weight by 10% by end of three months, and will re-evaluate then.  RTC in 4 weeks for Nurse visit to check weight & BP, and get next B12 injections.    Please refer to After Visit Summary for other counseling recommendations.    Melody Rockney Ghee, CNM    Consider the Low Glycemic Index Diet and 6 smaller meals  daily .  This boosts your metabolism and regulates your sugars:   Use the protein bar by [employer] because they have lots of fiber in them  Find the low carb flatbreads, tortillas and pita breads for sandwiches:  Joseph's makes a pita bread and a flat bread , available at [facility] and [employer]; [place] makes a low carb flatbread available at [employer] and HT that is 9 net carbs and 100 cal [place] makes a low carb whole wheat tortilla available at [employer] most grocery stores with 6 net carbs and 210 cal  [place] yogurt can still have a lot of carbs .  Dannon Light N fit has 80 cal and 8 carbs

## 2015-05-14 ENCOUNTER — Ambulatory Visit (INDEPENDENT_AMBULATORY_CARE_PROVIDER_SITE_OTHER): Payer: BC Managed Care – PPO | Admitting: Obstetrics and Gynecology

## 2015-05-14 VITALS — BP 145/87 | HR 91 | Ht 60.0 in | Wt 142.0 lb

## 2015-05-14 DIAGNOSIS — E669 Obesity, unspecified: Secondary | ICD-10-CM

## 2015-05-14 MED ORDER — CYANOCOBALAMIN 1000 MCG/ML IJ SOLN
1000.0000 ug | Freq: Once | INTRAMUSCULAR | Status: AC
  Administered 2015-05-14: 1000 ug via INTRAMUSCULAR

## 2015-05-14 NOTE — Progress Notes (Signed)
Patient ID: Deanna Velez, female   DOB: 12-01-1960, 55 y.o.   MRN: [id number] Pt presents for weight, B/P, B-12 injection. No side effects of medication-Phentermine, or B-12.  Weight loss of 10 lbs. Encouraged eating healthy and exercise.

## 2015-06-05 ENCOUNTER — Ambulatory Visit: Payer: BC Managed Care – PPO | Admitting: Unknown Physician Specialty

## 2015-06-10 ENCOUNTER — Ambulatory Visit: Payer: BC Managed Care – PPO | Admitting: Unknown Physician Specialty

## 2015-06-11 ENCOUNTER — Ambulatory Visit: Payer: BC Managed Care – PPO

## 2015-06-17 ENCOUNTER — Ambulatory Visit: Payer: BC Managed Care – PPO

## 2015-06-17 ENCOUNTER — Ambulatory Visit (INDEPENDENT_AMBULATORY_CARE_PROVIDER_SITE_OTHER): Payer: BC Managed Care – PPO | Admitting: Obstetrics and Gynecology

## 2015-06-17 VITALS — BP 139/92 | HR 91 | Wt 138.1 lb

## 2015-06-17 DIAGNOSIS — E663 Overweight: Secondary | ICD-10-CM | POA: Diagnosis not present

## 2015-06-17 MED ORDER — CYANOCOBALAMIN 1000 MCG/ML IJ SOLN
1000.0000 ug | Freq: Once | INTRAMUSCULAR | Status: AC
  Administered 2015-06-17: 1000 ug via INTRAMUSCULAR

## 2015-06-17 NOTE — Progress Notes (Signed)
Patient ID: Deanna Velez, female   DOB: 1960-09-22, 55 y.o.   MRN: [id number] Pt presents for weight, B/P, B-12 injection. No side effects of medication-Phentermine, or B-12.  Weight loss of 6 lbs. Encouraged eating healthy and exercise.

## 2015-07-08 ENCOUNTER — Ambulatory Visit (INDEPENDENT_AMBULATORY_CARE_PROVIDER_SITE_OTHER): Payer: BC Managed Care – PPO | Admitting: Obstetrics and Gynecology

## 2015-07-08 VITALS — BP 144/93 | HR 87 | Wt 136.1 lb

## 2015-07-08 DIAGNOSIS — E663 Overweight: Secondary | ICD-10-CM

## 2015-07-08 MED ORDER — CYANOCOBALAMIN 1000 MCG/ML IJ SOLN
1000.0000 ug | Freq: Once | INTRAMUSCULAR | Status: AC
  Administered 2015-07-08: 1000 ug via INTRAMUSCULAR

## 2015-07-08 MED ORDER — PHENTERMINE HCL 37.5 MG PO CAPS: 37.5000 mg | ORAL_CAPSULE | ORAL | Status: DC

## 2015-07-08 NOTE — Progress Notes (Signed)
Patient ID: Deanna Velez, female   DOB: Nov 08, 1960, 55 y.o.   MRN: [id number] Pt presents for weight, B/P, B-12 injection. No side effects of medication-Phentermine, or B-12.  Weight loss of  2  lbs. Encouraged eating healthy and exercise. Pt requested rx for phentermine x1 month. Wedding in June and is to see [facility] then. Will contact pt at [phone] tomorrow when ready for pick up.

## 2015-07-31 IMAGING — MG MM DIGITAL SCREENING BILAT W/ CAD
4 series · 4 of 4 positions shown · non-contrast
Comparison: Previous exam(s).

CLINICAL DATA: Screening.

EXAM:
DIGITAL SCREENING BILATERAL MAMMOGRAM WITH CAD

[R MLO]
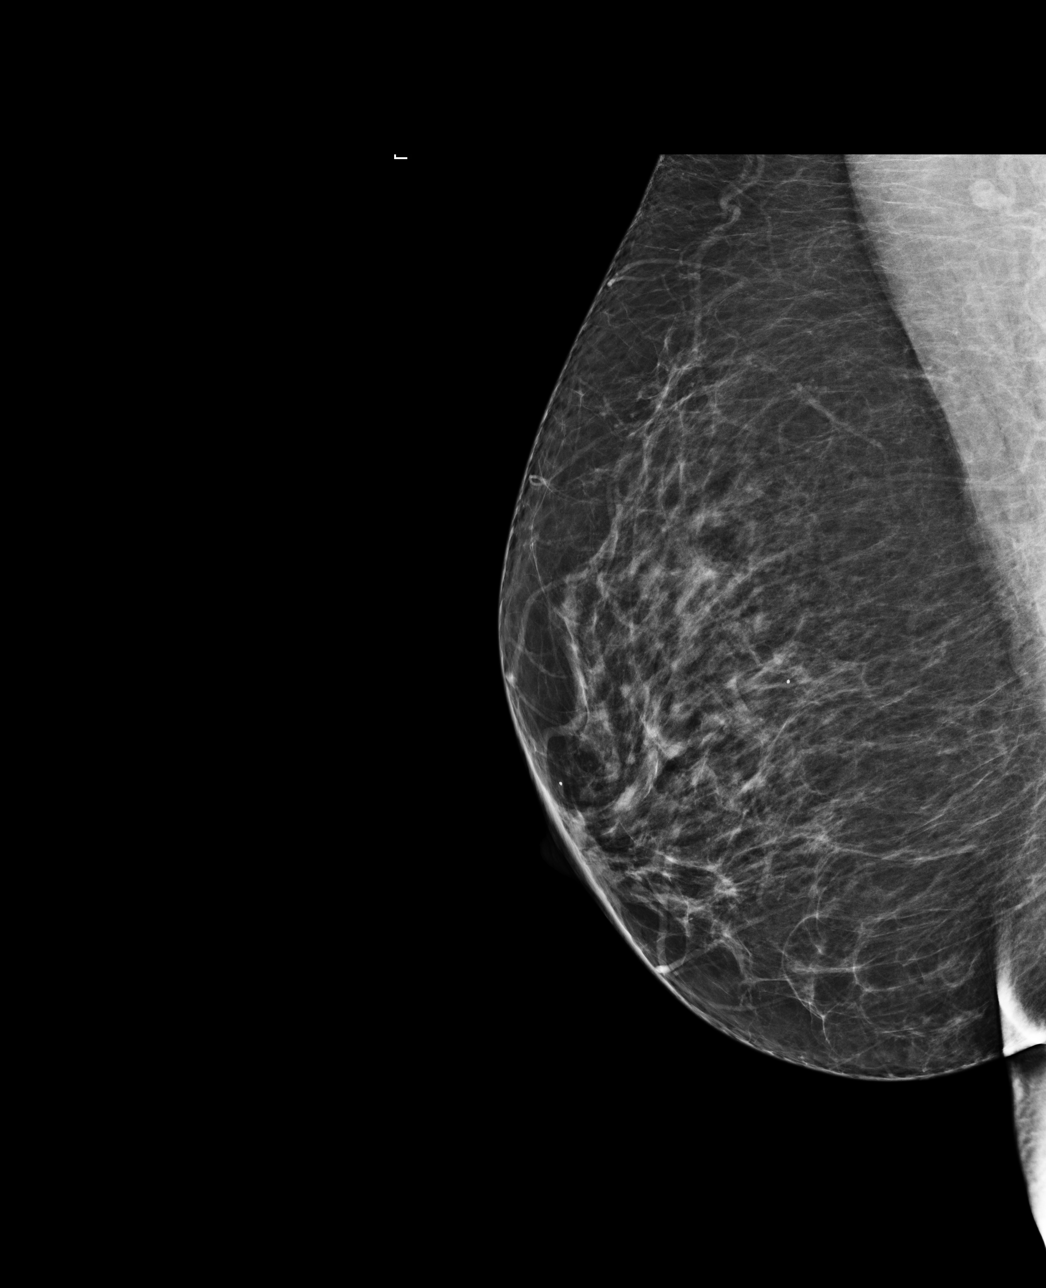

[R CC]
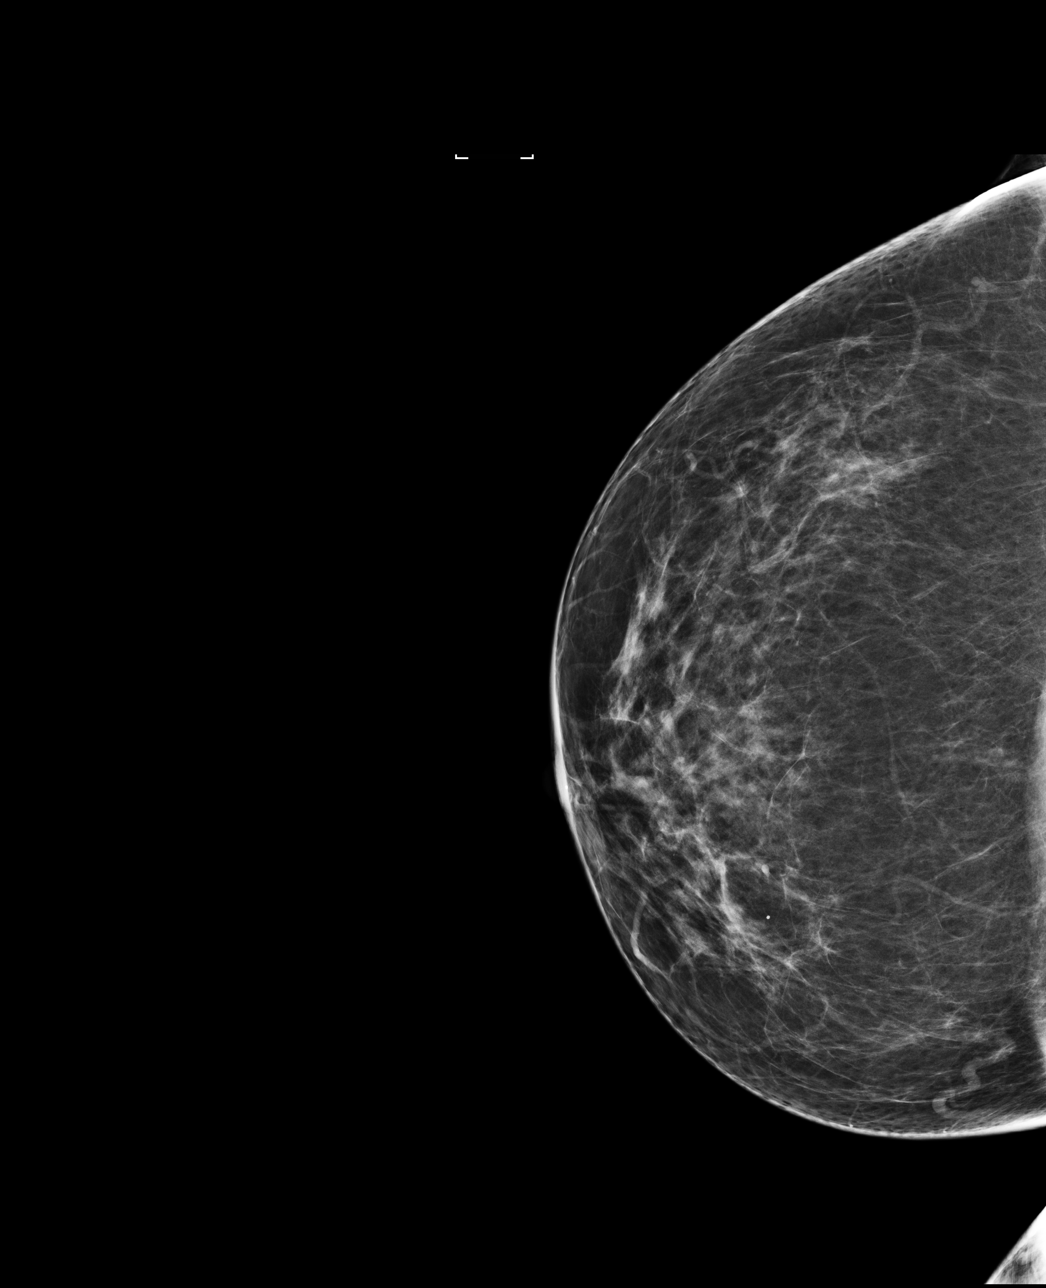

[L CC]
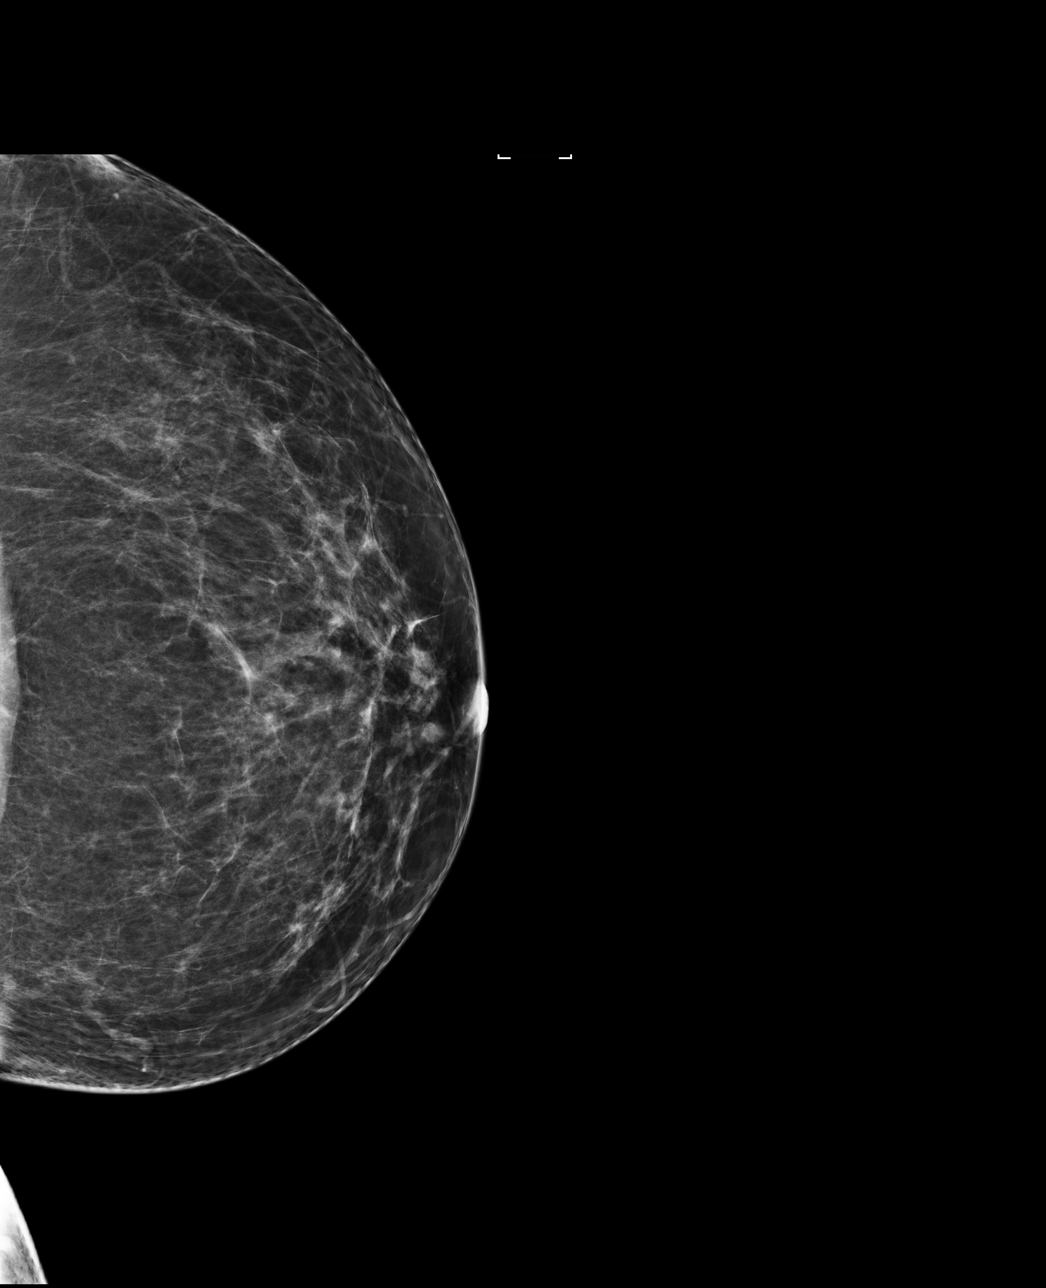

[L MLO]
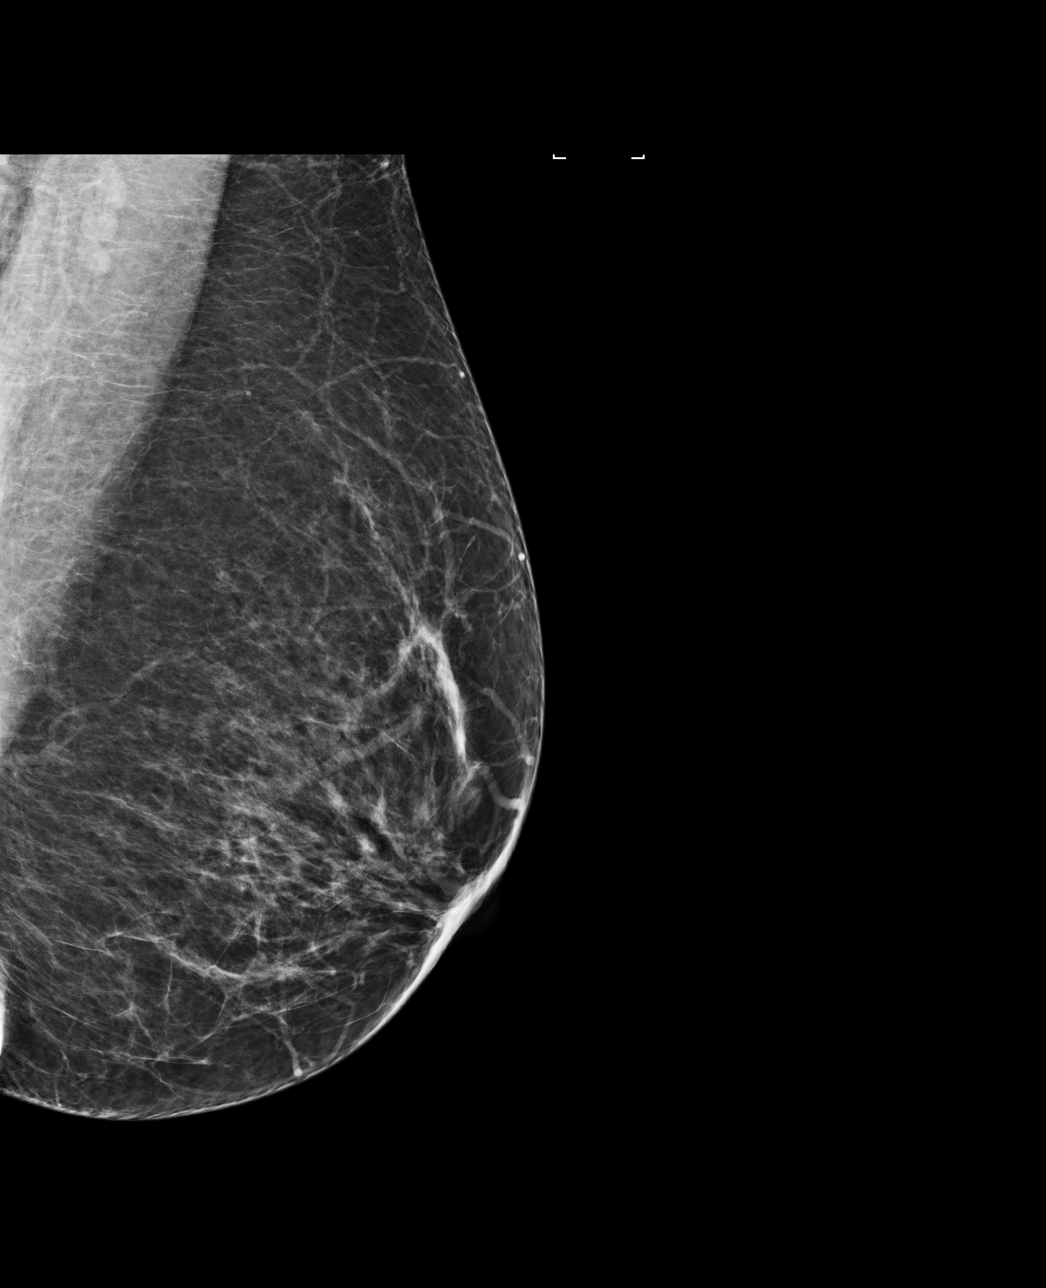

[4 of 4 positions shown; findings below may reference images not displayed]

ACR Breast Density Category b: There are scattered areas of
fibroglandular density.
FINDINGS: There are no findings suspicious for malignancy. Images were
processed with CAD.
IMPRESSION: No mammographic evidence of malignancy. A result letter of this
screening mammogram will be mailed directly to the patient.

RECOMMENDATION:
Screening mammogram in one year. (Code:AS-G-LCT)

BI-RADS CATEGORY  1: Negative.

## 2015-08-04 ENCOUNTER — Encounter: Payer: Self-pay | Admitting: Obstetrics and Gynecology

## 2015-08-18 ENCOUNTER — Encounter: Payer: Self-pay | Admitting: Obstetrics and Gynecology

## 2015-08-26 ENCOUNTER — Encounter: Payer: Self-pay | Admitting: Obstetrics and Gynecology

## 2015-08-31 ENCOUNTER — Ambulatory Visit (INDEPENDENT_AMBULATORY_CARE_PROVIDER_SITE_OTHER): Payer: BC Managed Care – PPO | Admitting: Unknown Physician Specialty

## 2015-08-31 ENCOUNTER — Encounter: Payer: Self-pay | Admitting: Unknown Physician Specialty

## 2015-08-31 VITALS — BP 132/82 | HR 90 | Temp 98.3°F | Ht 59.5 in | Wt 129.4 lb

## 2015-08-31 DIAGNOSIS — L509 Urticaria, unspecified: Secondary | ICD-10-CM

## 2015-08-31 DIAGNOSIS — H6091 Unspecified otitis externa, right ear: Secondary | ICD-10-CM

## 2015-08-31 MED ORDER — BETAMETHASONE DIPROPIONATE AUG 0.05 % EX CREA: TOPICAL_CREAM | Freq: Two times a day (BID) | CUTANEOUS | Status: DC

## 2015-08-31 MED ORDER — NEOMYCIN-POLYMYXIN-HC 3.5-10000-1 OT SOLN: 4.0000 [drp] | Freq: Four times a day (QID) | OTIC | Status: DC

## 2015-08-31 NOTE — Progress Notes (Signed)
BP 132/82 mmHg  Pulse 90  Temp(Src) 98.3 F (36.8 C)  Ht 4' 11.5" (1.511 m)  Wt 129 lb 6.4 oz (58.695 kg)  BMI 25.71 kg/m2  SpO2 95%  LMP  (LMP Unknown)   Subjective:    Patient ID: Deanna Velez, female    DOB: 05/16/1960, 55 y.o.   MRN: [id number]  HPI: Deanna Velez is a 55 y.o. female  Chief Complaint  Patient presents with  . URI    pt states she has had a cough, severe ear pain, and no voice for about 2 months now   . Impetigo    pt states she feels like she is getting impetigo all over her body    Pt states this started out with a cold.  She had a bad cough.  She had a fever blister in there mouth.  Her throat is uncomfortable.  States she has "impetigo all over her body" with small areas of blisters.  She has Bilateral ear pain.  Today, she presents with ear pain, rash that is spreading, and throat discomfort.  No fever.  No nasal congestion and cough has improved.    Relevant past medical, surgical, family and social history reviewed and updated as indicated. Interim medical history since our last visit reviewed. Allergies and medications reviewed and updated.  Review of Systems  Per HPI unless specifically indicated above     Objective:    BP 132/82 mmHg  Pulse 90  Temp(Src) 98.3 F (36.8 C)  Ht 4' 11.5" (1.511 m)  Wt 129 lb 6.4 oz (58.695 kg)  BMI 25.71 kg/m2  SpO2 95%  LMP  (LMP Unknown)  Wt Readings from Last 3 Encounters:  08/31/15 129 lb 6.4 oz (58.695 kg)  07/08/15 136 lb 2 oz (61.746 kg)  06/17/15 138 lb 2 oz (62.653 kg)    Physical Exam  Constitutional: She is oriented to person, place, and time. She appears well-developed and well-nourished. No distress.  HENT:  Head: Normocephalic and atraumatic.  Right Ear: Tympanic membrane normal. There is swelling and tenderness. Tympanic membrane is not injected, not perforated, not erythematous and not retracted.  Left Ear: Tympanic membrane, external ear and ear canal normal.  Nose: Rhinorrhea present.  Right sinus exhibits no maxillary sinus tenderness and no frontal sinus tenderness. Left sinus exhibits no maxillary sinus tenderness and no frontal sinus tenderness.  Mouth/Throat: Mucous membranes are normal. Posterior oropharyngeal erythema present.  Eyes: Conjunctivae and lids are normal. Right eye exhibits no discharge. Left eye exhibits no discharge. No scleral icterus.  Cardiovascular: Normal rate and regular rhythm.   Pulmonary/Chest: Effort normal and breath sounds normal. No respiratory distress.  Abdominal: Normal appearance. There is no splenomegaly or hepatomegaly.  Musculoskeletal: Normal range of motion.  Neurological: She is alert and oriented to person, place, and time.  Skin: Skin is intact. No rash noted. No pallor.  Psychiatric: She has a normal mood and affect. Her behavior is normal. Judgment and thought content normal.    Results for orders placed or performed in visit on 02/27/15  Hepatitis C antibody  Result Value Ref Range   Hep C Virus Ab <0.1 0.0 - 0.9 s/co ratio  HIV antibody  Result Value Ref Range   HIV Screen 4th Generation wRfx Non Reactive Non Reactive  CBC with Differential/Platelet  Result Value Ref Range   WBC 8.7 3.4 - 10.8 x10E3/uL   RBC 4.59 3.77 - 5.28 x10E6/uL   Hemoglobin 13.0 11.1 - 15.9 g/dL  Hematocrit 39.6 34.0 - 46.6 %   MCV 86 79 - 97 fL   MCH 28.3 26.6 - 33.0 pg   MCHC 32.8 31.5 - 35.7 g/dL   RDW 13.4 12.3 - 15.4 %   Platelets 306 150 - 379 x10E3/uL   Neutrophils 57 %   Lymphs 34 %   Monocytes 7 %   Eos 2 %   Basos 0 %   Neutrophils Absolute 4.9 1.4 - 7.0 x10E3/uL   Lymphocytes Absolute 3.0 0.7 - 3.1 x10E3/uL   Monocytes Absolute 0.6 0.1 - 0.9 x10E3/uL   EOS (ABSOLUTE) 0.2 0.0 - 0.4 x10E3/uL   Basophils Absolute 0.0 0.0 - 0.2 x10E3/uL   Immature Granulocytes 0 %   Immature Grans (Abs) 0.0 0.0 - 0.1 x10E3/uL  Comprehensive metabolic panel  Result Value Ref Range   Glucose 104 (H) 65 - 99 mg/dL   BUN 13 6 - 24 mg/dL    Creatinine, Ser 0.81 0.57 - 1.00 mg/dL   GFR calc non Af Amer 83 >59 mL/min/1.73   GFR calc Af Amer 96 >59 mL/min/1.73   BUN/Creatinine Ratio 16 9 - 23   Sodium 143 134 - 144 mmol/L   Potassium 4.2 3.5 - 5.2 mmol/L   Chloride 102 96 - 106 mmol/L   CO2 22 18 - 29 mmol/L   Calcium 10.1 8.7 - 10.2 mg/dL   Total Protein 7.4 6.0 - 8.5 g/dL   Albumin 4.9 3.5 - 5.5 g/dL   Globulin, Total 2.5 1.5 - 4.5 g/dL   Albumin/Globulin Ratio 2.0 1.1 - 2.5   Bilirubin Total 0.2 0.0 - 1.2 mg/dL   Alkaline Phosphatase 80 39 - 117 IU/L   AST 35 0 - 40 IU/L   ALT 35 (H) 0 - 32 IU/L  Lipid Panel w/o Chol/HDL Ratio  Result Value Ref Range   Cholesterol, Total 203 (H) 100 - 199 mg/dL   Triglycerides 76 0 - 149 mg/dL   HDL 71 >39 mg/dL   VLDL Cholesterol Cal 15 5 - 40 mg/dL   LDL Calculated 117 (H) 0 - 99 mg/dL  TSH  Result Value Ref Range   TSH 1.120 0.450 - 4.500 uIU/mL  Amylase  Result Value Ref Range   Amylase 40 31 - 124 U/L  Lipase  Result Value Ref Range   Lipase 45 0 - 59 U/L      Assessment & Plan:   Problem List Items Addressed This Visit    None    Visit Diagnoses    Otitis externa, right    -  Primary    Start Corticosporin 4 drops in right ear QID    Relevant Medications    neomycin-polymyxin-hydrocortisone (CORTISPORIN) otic solution    Urticaria        Recommended OTC Zyrtec.  Will rx steroid cream        Follow up plan: Return if symptoms worsen or fail to improve.

## 2015-09-11 ENCOUNTER — Other Ambulatory Visit: Payer: Self-pay | Admitting: Obstetrics and Gynecology

## 2015-09-11 DIAGNOSIS — N950 Postmenopausal bleeding: Secondary | ICD-10-CM

## 2015-09-15 ENCOUNTER — Encounter: Payer: Self-pay | Admitting: Obstetrics and Gynecology

## 2015-09-15 ENCOUNTER — Ambulatory Visit (INDEPENDENT_AMBULATORY_CARE_PROVIDER_SITE_OTHER): Payer: BC Managed Care – PPO | Admitting: Obstetrics and Gynecology

## 2015-09-15 ENCOUNTER — Ambulatory Visit (INDEPENDENT_AMBULATORY_CARE_PROVIDER_SITE_OTHER): Payer: BC Managed Care – PPO

## 2015-09-15 VITALS — BP 149/86 | HR 73 | Ht 60.0 in | Wt 133.7 lb

## 2015-09-15 DIAGNOSIS — N950 Postmenopausal bleeding: Secondary | ICD-10-CM

## 2015-09-15 DIAGNOSIS — D251 Intramural leiomyoma of uterus: Secondary | ICD-10-CM

## 2015-09-15 DIAGNOSIS — E669 Obesity, unspecified: Secondary | ICD-10-CM

## 2015-09-15 MED ORDER — CYANOCOBALAMIN 1000 MCG/ML IJ SOLN
1000.0000 ug | Freq: Once | INTRAMUSCULAR | Status: AC
  Administered 2015-09-15: 1000 ug via INTRAMUSCULAR

## 2015-09-15 MED ORDER — VITAMIN B-12 100 MCG PO TABS: 1000.0000 ug | ORAL_TABLET | Freq: Every day | ORAL | Status: DC

## 2015-09-15 NOTE — Progress Notes (Signed)
Chief complaint: 1. Uterine fibroids 2. History of postmenopausal bleeding 3. History of pelvic pain  Patient presents following ultrasound for further management of above problems. She is not experiencing any ongoing pelvic pain. She has not had any postmenopausal bleeding in the past 6 months.  Pelvic ultrasound: 1. Fibroid uterus-1.7 cm fundal fibroid; endometrial stripe 1.9 mm 2. No significant interval change from prior exam  ASSESSMENT: 1. Stable uterine fibroid 2. Resolution of postmenopausal bleeding and pelvic pain  PLAN: 1. Maintain routine gynecologic follow up with Lorelle Gibbs, certified nurse midwife 2. Follow-up as needed if postmenopausal bleeding RECURS  A total of 15 minutes were spent face-to-face with the patient during this encounter and over half of that time dealt with counseling and coordination of care.  Brayton Mars, MD  Note: This dictation was prepared with Dragon dictation along with smaller phrase technology. Any transcriptional errors that result from this process are unintentional.

## 2015-09-15 NOTE — Patient Instructions (Addendum)
1. No further postmenopausal bleeding or pain; ultrasound is stable-unchanged from prior study 2. Maintain routine gynecologic follow up with Melody Shambley 3. Return when necessary if postmenopausal bleeding recurs

## 2015-09-25 ENCOUNTER — Other Ambulatory Visit: Payer: Self-pay | Admitting: Obstetrics and Gynecology

## 2015-09-25 DIAGNOSIS — Z1231 Encounter for screening mammogram for malignant neoplasm of breast: Secondary | ICD-10-CM

## 2015-10-16 ENCOUNTER — Other Ambulatory Visit: Payer: Self-pay | Admitting: Obstetrics and Gynecology

## 2015-10-16 ENCOUNTER — Ambulatory Visit
Admission: RE | Admit: 2015-10-16 | Discharge: 2015-10-16 | Disposition: A | Discharge status: Home / Self Care | Payer: BC Managed Care – PPO | Source: Ambulatory Visit | Attending: Obstetrics and Gynecology | Admitting: Obstetrics and Gynecology

## 2015-10-16 DIAGNOSIS — Z1231 Encounter for screening mammogram for malignant neoplasm of breast: Secondary | ICD-10-CM

## 2015-10-19 ENCOUNTER — Telehealth: Payer: Self-pay | Admitting: *Deleted

## 2015-10-19 ENCOUNTER — Encounter: Payer: Self-pay | Admitting: *Deleted

## 2015-10-19 NOTE — Telephone Encounter (Signed)
Notified pt by mail

## 2015-10-19 NOTE — Telephone Encounter (Signed)
-----   Message from [name], [place] sent at 10/18/2015  8:44 PM EDT ----- Please let her know I have reviewed her mammogram and it is normal.

## 2015-11-25 ENCOUNTER — Encounter: Payer: BC Managed Care – PPO | Admitting: Obstetrics and Gynecology

## 2016-01-01 ENCOUNTER — Encounter: Payer: BC Managed Care – PPO | Admitting: Obstetrics and Gynecology

## 2016-03-25 ENCOUNTER — Encounter: Payer: BC Managed Care – PPO | Admitting: Obstetrics and Gynecology

## 2016-04-29 ENCOUNTER — Encounter: Payer: Self-pay | Admitting: Obstetrics and Gynecology

## 2016-04-29 ENCOUNTER — Ambulatory Visit (INDEPENDENT_AMBULATORY_CARE_PROVIDER_SITE_OTHER): Payer: BC Managed Care – PPO | Admitting: Obstetrics and Gynecology

## 2016-04-29 VITALS — BP 149/100 | HR 94 | Ht 60.0 in | Wt 149.9 lb

## 2016-04-29 DIAGNOSIS — R102 Pelvic and perineal pain: Secondary | ICD-10-CM

## 2016-04-29 DIAGNOSIS — Z01411 Encounter for gynecological examination (general) (routine) with abnormal findings: Secondary | ICD-10-CM

## 2016-04-29 DIAGNOSIS — F17200 Nicotine dependence, unspecified, uncomplicated: Secondary | ICD-10-CM | POA: Diagnosis not present

## 2016-04-29 LAB — POCT URINALYSIS DIPSTICK
Bilirubin, UA: NEGATIVE
GLUCOSE UA: NEGATIVE
Ketones, UA: NEGATIVE
Leukocytes, UA: NEGATIVE
NITRITE UA: NEGATIVE
PH UA: 6
PROTEIN UA: NEGATIVE
RBC UA: NEGATIVE
SPEC GRAV UA: 1.015
UROBILINOGEN UA: 0.2

## 2016-04-29 MED ORDER — NICOTINE 21 MG/24HR TD PT24: 21.0000 mg | MEDICATED_PATCH | Freq: Every day | TRANSDERMAL | 0 refills | Status: DC

## 2016-04-29 MED ORDER — NICOTINE 7 MG/24HR TD PT24: 7.0000 mg | MEDICATED_PATCH | Freq: Every day | TRANSDERMAL | 0 refills | Status: DC

## 2016-04-29 MED ORDER — NICOTINE 14 MG/24HR TD PT24: 14.0000 mg | MEDICATED_PATCH | Freq: Every day | TRANSDERMAL | 0 refills | Status: DC

## 2016-04-29 NOTE — Progress Notes (Signed)
Subjective:   Deanna Velez is a 56 y.o. No obstetric history on file. Hispanic female here for a routine well-woman exam.  No LMP recorded (lmp unknown). Patient is postmenopausal.    Current complaints: return of lower pelvic pain a week and half ago, worse when standing, normal BM and urination.  Also been taking benedryl for a month to help sleep but started having nose bleeds, switch to melatonin and it has improved. PCP: [facility]       does desire labs  Social History: Sexual: heterosexual Marital Status: married Living situation: with family Occupation: unknown occupation Tobacco/alcohol: quit smoking for two months, but restarted, desires stopping again, patch helps. Illicit drugs: no history of illicit drug use  The following portions of the patient's history were reviewed and updated as appropriate: allergies, current medications, past family history, past medical history, past social history, past surgical history and problem list.  Past Medical History Past Medical History:  Diagnosis Date  . Gallbladder attack   . Lack of libido   . PMB (postmenopausal bleeding)   . Ringworm   . Vitamin D deficiency disease     Past Surgical History Past Surgical History:  Procedure Laterality Date  . TOE SURGERY Right 05/2014  . TONSILLECTOMY    . TUBAL LIGATION      Gynecologic History No obstetric history on file.  No LMP recorded (lmp unknown). Patient is postmenopausal. Contraception: post menopausal status Last Pap: 2016. Results were: normal Last mammogram: 2017. Results were: normal  Obstetric History OB History  No data available    Current Medications Current Outpatient Prescriptions on File Prior to Visit  Medication Sig Dispense Refill  . augmented betamethasone dipropionate (DIPROLENE AF) 0.05 % cream Apply topically 2 (two) times daily. (Patient not taking: Reported on 04/29/2016) 30 g 0  . clotrimazole-betamethasone (LOTRISONE) cream Apply 1 application  topically 2 (two) times daily. (Patient not taking: Reported on 04/29/2016) 30 g 0  . EPINEPHrine (EPIPEN IJ) Inject as directed.    Marland Kitchen ibuprofen (ADVIL,MOTRIN) 800 MG tablet Take 1 tablet (800 mg total) by mouth every 8 (eight) hours as needed. (Patient not taking: Reported on 04/29/2016) 30 tablet 2  . meloxicam (MOBIC) 15 MG tablet Take 15 mg by mouth daily.    Marland Kitchen neomycin-polymyxin-hydrocortisone (CORTISPORIN) otic solution Place 4 drops into the left ear 4 (four) times daily. (Patient not taking: Reported on 04/29/2016) 10 mL 0   No current facility-administered medications on file prior to visit.     Review of Systems Patient denies any headaches, blurred vision, shortness of breath, chest pain, abdominal pain, problems with bowel movements, urination, or intercourse.  Objective:  BP (!) 149/100   Pulse 94   Ht 5' (1.524 m)   Wt 149 lb 14.4 oz (68 kg)   LMP  (LMP Unknown)   BMI 29.28 kg/m  Physical Exam  General:  Well developed, well nourished, no acute distress. She is alert and oriented x3. Skin:  Warm and dry Neck:  Midline trachea, no thyromegaly or nodules Cardiovascular: Regular rate and rhythm, no murmur heard Lungs:  Effort normal, all lung fields clear to auscultation bilaterally Breasts:  No dominant palpable mass, retraction, or nipple discharge Abdomen:  Soft, non tender, no hepatosplenomegaly or masses Pelvic:  External genitalia is normal in appearance.  The vagina is normal in appearance. The cervix is bulbous, no CMT.  Thin prep pap is not done . Uterus is felt to be normal size, shape, and contour.  No adnexal  masses or tenderness noted. Extremities:  No swelling or varicosities noted Psych:  She has a normal mood and affect  Assessment:   Healthy well-woman exam Lower pelvic pain Smoker- desires cessation Overweight    Plan:  Routine screening labs obtained rx sent for nicotine patches OK with recommended screening Chest CT F/U 1 year for AE, or sooner if  needed Mammogram ordered or sooner if problems Referral to GI placed for screening colonoscopy ( never had one)  Gershom Brobeck Rockney Ghee, CNM

## 2016-04-29 NOTE — Patient Instructions (Addendum)
  Place annual gynecologic exam patient instructions here.  Thank you for enrolling in [facility]. Please follow the instructions below to securely access your online medical record. MyChart allows you to send messages to your doctor, view your test results, manage appointments, and more.   How Do I Sign Up? 1. In your Internet browser, go to [employer] and enter [email or web address]. 2. Click on the Sign Up Now link in the Sign In box. You will see the New Member Sign Up page. 3. Enter your MyChart Access Code exactly as it appears below. You will not need to use this code after you've completed the sign-up process. If you do not sign up before the expiration date, you must request a new code.  MyChart Access Code: [email or web address] Expires: 06/28/2016  9:54 AM  4. Enter your Social Security Number ([id number]) and Date of Birth (mm/dd/yyyy) as indicated and click Submit. You will be taken to the next sign-up page. 5. Create a MyChart ID. This will be your MyChart login ID and cannot be changed, so think of one that is secure and easy to remember. 6. Create a MyChart password. You can change your password at any time. 7. Enter your Password Reset Question and Answer. This can be used at a later time if you forget your password.  8. Enter your e-mail address. You will receive e-mail notification when new information is available in [place]. 9. Click Sign Up. You can now view your medical record.   Additional Information Remember, MyChart is NOT to be used for urgent needs. For medical emergencies, dial 911.

## 2016-04-30 LAB — COMPREHENSIVE METABOLIC PANEL
A/G RATIO: 2 (ref 1.2–2.2)
ALK PHOS: 69 IU/L (ref 39–117)
ALT: 24 IU/L (ref 0–32)
AST: 29 IU/L (ref 0–40)
Albumin: 5.1 g/dL (ref 3.5–5.5)
BILIRUBIN TOTAL: 0.4 mg/dL (ref 0.0–1.2)
BUN/Creatinine Ratio: 15 (ref 9–23)
BUN: 11 mg/dL (ref 6–24)
CALCIUM: 10.6 mg/dL — AB (ref 8.7–10.2)
CHLORIDE: 101 mmol/L (ref 96–106)
CO2: 21 mmol/L (ref 18–29)
Creatinine, Ser: 0.73 mg/dL (ref 0.57–1.00)
GFR calc Af Amer: 108 mL/min/{1.73_m2} (ref >59)
GFR, EST NON AFRICAN AMERICAN: 94 mL/min/{1.73_m2} (ref >59)
GLOBULIN, TOTAL: 2.5 g/dL (ref 1.5–4.5)
Glucose: 105 mg/dL — ABNORMAL HIGH (ref 65–99)
POTASSIUM: 4.5 mmol/L (ref 3.5–5.2)
SODIUM: 141 mmol/L (ref 134–144)
Total Protein: 7.6 g/dL (ref 6.0–8.5)

## 2016-04-30 LAB — VITAMIN D 25 HYDROXY (VIT D DEFICIENCY, FRACTURES): Vit D, 25-Hydroxy: 30.5 ng/mL (ref 30.0–100.0)

## 2016-04-30 LAB — LIPID PANEL
CHOL/HDL RATIO: 2.6 ratio (ref 0.0–4.4)
CHOLESTEROL TOTAL: 186 mg/dL (ref 100–199)
HDL: 71 mg/dL (ref >39)
LDL Calculated: 98 mg/dL (ref 0–99)
TRIGLYCERIDES: 86 mg/dL (ref 0–149)
VLDL Cholesterol Cal: 17 mg/dL (ref 5–40)

## 2016-04-30 LAB — THYROID PANEL WITH TSH
FREE THYROXINE INDEX: 1.7 (ref 1.2–4.9)
T3 UPTAKE RATIO: 27 % (ref 24–39)
T4 TOTAL: 6.4 ug/dL (ref 4.5–12.0)
TSH: 0.637 u[IU]/mL (ref 0.450–4.500)

## 2016-05-02 ENCOUNTER — Telehealth: Payer: Self-pay | Admitting: *Deleted

## 2016-05-02 NOTE — Telephone Encounter (Signed)
-----   Message from [name], [place] sent at 04/30/2016  2:26 PM EST ----- Please let her know labs look good

## 2016-05-02 NOTE — Telephone Encounter (Signed)
Mailed info to pt 

## 2016-07-11 ENCOUNTER — Ambulatory Visit (INDEPENDENT_AMBULATORY_CARE_PROVIDER_SITE_OTHER): Payer: BC Managed Care – PPO | Admitting: Family Medicine

## 2016-07-11 ENCOUNTER — Encounter: Payer: Self-pay | Admitting: Family Medicine

## 2016-07-11 VITALS — BP 145/87 | HR 88 | Temp 98.6°F | Wt 153.0 lb

## 2016-07-11 DIAGNOSIS — I10 Essential (primary) hypertension: Secondary | ICD-10-CM

## 2016-07-11 MED ORDER — LISINOPRIL 10 MG PO TABS: 10.0000 mg | ORAL_TABLET | Freq: Every day | ORAL | 1 refills | Status: DC

## 2016-07-11 NOTE — Progress Notes (Signed)
   BP (!) 145/87   Pulse 88   Temp 98.6 F (37 C)   Wt 153 lb (69.4 kg)   LMP  (LMP Unknown)   SpO2 96%   BMI 29.88 kg/m    Subjective:    Patient ID: Deanna Velez, female    DOB: 08-05-1960, 56 y.o.   MRN: [id number]  HPI: Deanna Velez is a 56 y.o. female  Chief Complaint  Patient presents with  . Hypertension    over the last 6 months. Never been on BP meds.  194/105 yesterday pm. Took a 1/4th (5mg ) of her husband's Lisinopril.   Patient presents today for persistently elevated BP. States it's been creeping up over the last 6 months, and when she checked it last night it was 194/105. Called a family member who is a Designer, jewellery who recommended she take 1/4 of her husband's 20 mg lisinopril. Noted that her BP slowly came down over the course of the evening. Tolerated the medication well. Has been having occasional palpitations and HAs when BP has been high.  Of note, pt quit smoking about 1 month ago, was able to do it cold [place]. Tried patches and vaping before this but eventually just decided it was time to be done once and for all.   Relevant past medical, surgical, family and social history reviewed and updated as indicated. Interim medical history since our last visit reviewed. Allergies and medications reviewed and updated.  Review of Systems  Constitutional: Negative.   HENT: Negative.   Eyes: Negative.   Respiratory: Negative.   Cardiovascular: Positive for palpitations.  Gastrointestinal: Negative.   Genitourinary: Negative.   Musculoskeletal: Negative.   Neurological: Positive for headaches.  Psychiatric/Behavioral: Negative.    Per HPI unless specifically indicated above     Objective:    BP (!) 145/87   Pulse 88   Temp 98.6 F (37 C)   Wt 153 lb (69.4 kg)   LMP  (LMP Unknown)   SpO2 96%   BMI 29.88 kg/m   Wt Readings from Last 3 Encounters:  07/11/16 153 lb (69.4 kg)  04/29/16 149 lb 14.4 oz (68 kg)  09/15/15 133 lb 11.2 oz (60.6 kg)      Physical Exam  Constitutional: She is oriented to person, place, and time. She appears well-developed and well-nourished.  HENT:  Head: Atraumatic.  Eyes: Conjunctivae are normal. Pupils are equal, round, and reactive to light.  Neck: Normal range of motion. Neck supple.  Cardiovascular: Normal rate and normal heart sounds.   Pulmonary/Chest: Effort normal and breath sounds normal.  Musculoskeletal: Normal range of motion.  Neurological: She is alert and oriented to person, place, and time.  Skin: Skin is warm and dry.  Psychiatric: She has a normal mood and affect. Her behavior is normal.  Nursing note and vitals reviewed.     Assessment & Plan:   Problem List Items Addressed This Visit      Cardiovascular and Mediastinum   Hypertension - Primary    Not at goal. Will start 10 mg lisinopril. Risks and benefits reviewed. Discussed DASH diet and increasing physical activity as well. F/u in 1 month for recheck      Relevant Medications   lisinopril (PRINIVIL,ZESTRIL) 10 MG tablet       Follow up plan: Return in about 4 weeks (around 08/08/2016) for BP recheck, BMP.

## 2016-07-11 NOTE — Patient Instructions (Addendum)
Start taking 10 mg lisinopril daily. Continue to monitor BP readings regularly.  DASH Eating Plan DASH stands for "Dietary Approaches to Stop Hypertension." The DASH eating plan is a healthy eating plan that has been shown to reduce high blood pressure (hypertension). It may also reduce your risk for type 2 diabetes, heart disease, and stroke. The DASH eating plan may also help with weight loss. What are tips for following this plan? General guidelines   Avoid eating more than 2,300 mg (milligrams) of salt (sodium) a day. If you have hypertension, you may need to reduce your sodium intake to 1,500 mg a day.  Limit alcohol intake to no more than 1 drink a day for nonpregnant women and 2 drinks a day for men. One drink equals 12 oz of beer, 5 oz of wine, or 1 oz of hard liquor.  Work with your health care provider to maintain a healthy body weight or to lose weight. Ask what an ideal weight is for you.  Get at least 30 minutes of exercise that causes your heart to beat faster (aerobic exercise) most days of the week. Activities may include walking, swimming, or biking.  Work with your health care provider or diet and nutrition specialist (dietitian) to adjust your eating plan to your individual calorie needs. Reading food labels   Check food labels for the amount of sodium per serving. Choose foods with less than 5 percent of the Daily Value of sodium. Generally, foods with less than 300 mg of sodium per serving fit into this eating plan.  To find whole grains, look for the word "whole" as the first word in the ingredient list. Shopping   Buy products labeled as "low-sodium" or "no salt added."  Buy fresh foods. Avoid canned foods and premade or frozen meals. Cooking   Avoid adding salt when cooking. Use salt-free seasonings or herbs instead of table salt or sea salt. Check with your health care provider or pharmacist before using salt substitutes.  Do not fry foods. Cook foods using  healthy methods such as baking, boiling, grilling, and broiling instead.  Cook with heart-healthy oils, such as olive, canola, soybean, or sunflower oil. Meal planning    Eat a balanced diet that includes:  5 or more servings of fruits and vegetables each day. At each meal, try to fill half of your plate with fruits and vegetables.  Up to 6-8 servings of whole grains each day.  Less than 6 oz of lean meat, poultry, or fish each day. A 3-oz serving of meat is about the same size as a deck of cards. One egg equals 1 oz.  2 servings of low-fat dairy each day.  A serving of nuts, seeds, or beans 5 times each week.  Heart-healthy fats. Healthy fats called Omega-3 fatty acids are found in foods such as flaxseeds and coldwater fish, like sardines, salmon, and mackerel.  Limit how much you eat of the following:  Canned or prepackaged foods.  Food that is high in trans fat, such as fried foods.  Food that is high in saturated fat, such as fatty meat.  Sweets, desserts, sugary drinks, and other foods with added sugar.  Full-fat dairy products.  Do not salt foods before eating.  Try to eat at least 2 vegetarian meals each week.  Eat more home-cooked food and less restaurant, buffet, and fast food.  When eating at a restaurant, ask that your food be prepared with less salt or no salt, if possible. What  foods are recommended? The items listed may not be a complete list. Talk with your dietitian about what dietary choices are best for you. Grains  Whole-grain or whole-wheat bread. Whole-grain or whole-wheat pasta. Brown rice. [place]. Bulgur. Whole-grain and low-sodium cereals. Pita bread. Low-fat, low-sodium crackers. Whole-wheat flour tortillas. Vegetables  Fresh or frozen vegetables (raw, steamed, roasted, or grilled). Low-sodium or reduced-sodium tomato and vegetable juice. Low-sodium or reduced-sodium tomato sauce and tomato paste. Low-sodium or reduced-sodium canned  vegetables. Fruits  All fresh, dried, or frozen fruit. Canned fruit in natural juice (without added sugar). Meat and other protein foods  Skinless chicken or [place]. Ground chicken or [place]. Pork with fat trimmed off. Fish and seafood. Egg whites. Dried beans, peas, or lentils. Unsalted nuts, nut butters, and seeds. Unsalted canned beans. Lean cuts of beef with fat trimmed off. Low-sodium, lean deli meat. Dairy  Low-fat (1%) or fat-free (skim) milk. Fat-free, low-fat, or reduced-fat cheeses. Nonfat, low-sodium ricotta or cottage cheese. Low-fat or nonfat yogurt. Low-fat, low-sodium cheese. Fats and oils  Soft margarine without trans fats. Vegetable oil. Low-fat, reduced-fat, or light mayonnaise and salad dressings (reduced-sodium). Canola, safflower, olive, soybean, and sunflower oils. Avocado. Seasoning and other foods  Herbs. Spices. Seasoning mixes without salt. Unsalted popcorn and pretzels. Fat-free sweets. What foods are not recommended? The items listed may not be a complete list. Talk with your dietitian about what dietary choices are best for you. Grains  Baked goods made with fat, such as croissants, muffins, or some breads. Dry pasta or rice meal packs. Vegetables  Creamed or fried vegetables. Vegetables in a cheese sauce. Regular canned vegetables (not low-sodium or reduced-sodium). Regular canned tomato sauce and paste (not low-sodium or reduced-sodium). Regular tomato and vegetable juice (not low-sodium or reduced-sodium). Angie Fava. Olives. Fruits  Canned fruit in a light or heavy syrup. Fried fruit. Fruit in cream or butter sauce. Meat and other protein foods  Fatty cuts of meat. Ribs. Fried meat. Berniece Salines. Sausage. Bologna and other processed lunch meats. Salami. Fatback. Hotdogs. Bratwurst. Salted nuts and seeds. Canned beans with added salt. Canned or smoked fish. Whole eggs or egg yolks. Chicken or [place] with skin. Dairy  Whole or 2% milk, cream, and half-and-half. Whole or  full-fat cream cheese. Whole-fat or sweetened yogurt. Full-fat cheese. Nondairy creamers. Whipped toppings. Processed cheese and cheese spreads. Fats and oils  Butter. Stick margarine. Lard. Shortening. Ghee. Bacon fat. Tropical oils, such as coconut, palm kernel, or palm oil. Seasoning and other foods  Salted popcorn and pretzels. Onion salt, garlic salt, seasoned salt, table salt, and sea salt. Worcestershire sauce. Tartar sauce. Barbecue sauce. Teriyaki sauce. Soy sauce, including reduced-sodium. Steak sauce. Canned and packaged gravies. Fish sauce. Oyster sauce. Cocktail sauce. Horseradish that you find on the shelf. Ketchup. Mustard. Meat flavorings and tenderizers. Bouillon cubes. Hot sauce and Tabasco sauce. Premade or packaged marinades. Premade or packaged taco seasonings. Relishes. Regular salad dressings. Where to find more information:  National Heart, Lung, and [place]: https://wilson-eaton.com/  American Heart Association: www.heart.org Summary  The DASH eating plan is a healthy eating plan that has been shown to reduce high blood pressure (hypertension). It may also reduce your risk for type 2 diabetes, heart disease, and stroke.  With the DASH eating plan, you should limit salt (sodium) intake to 2,300 mg a day. If you have hypertension, you may need to reduce your sodium intake to 1,500 mg a day.  When on the DASH eating plan, aim to eat more fresh fruits  and vegetables, whole grains, lean proteins, low-fat dairy, and heart-healthy fats.  Work with your health care provider or diet and nutrition specialist (dietitian) to adjust your eating plan to your individual calorie needs. This information is not intended to replace advice given to you by your health care provider. Make sure you discuss any questions you have with your health care provider. Document Released: 01/27/2011 Document Revised: 02/01/2016 Document Reviewed: 02/01/2016 Elsevier Interactive Patient Education  2017  [employer].

## 2016-07-11 NOTE — Assessment & Plan Note (Signed)
Not at goal. Will start 10 mg lisinopril. Risks and benefits reviewed. Discussed DASH diet and increasing physical activity as well. F/u in 1 month for recheck

## 2016-08-04 ENCOUNTER — Telehealth: Payer: Self-pay | Admitting: Unknown Physician Specialty

## 2016-08-04 NOTE — Telephone Encounter (Signed)
Pt would like a refill for lisinopril (PRINIVIL,ZESTRIL) 10 MG tablet sent to [facility].

## 2016-08-04 NOTE — Telephone Encounter (Signed)
Called and let patient know that she had a refill on her medication at the pharmacy.

## 2016-08-04 NOTE — Telephone Encounter (Signed)
Called Deanna Velez because according to chart, patient should have a refill on her lisinopril. [name] at the pharmacy stated that they do have a refill on file and that they are going to get it ready for the patient. Will call and let her know.

## 2016-08-08 ENCOUNTER — Ambulatory Visit: Payer: BC Managed Care – PPO | Admitting: Family Medicine

## 2016-08-16 ENCOUNTER — Ambulatory Visit: Payer: BC Managed Care – PPO | Admitting: Family Medicine

## 2016-08-17 ENCOUNTER — Ambulatory Visit (INDEPENDENT_AMBULATORY_CARE_PROVIDER_SITE_OTHER): Payer: BC Managed Care – PPO | Admitting: Family Medicine

## 2016-08-17 ENCOUNTER — Encounter: Payer: Self-pay | Admitting: Family Medicine

## 2016-08-17 VITALS — BP 126/81 | HR 79 | Temp 98.5°F | Wt 149.0 lb

## 2016-08-17 DIAGNOSIS — J309 Allergic rhinitis, unspecified: Secondary | ICD-10-CM

## 2016-08-17 DIAGNOSIS — I10 Essential (primary) hypertension: Secondary | ICD-10-CM | POA: Diagnosis not present

## 2016-08-17 MED ORDER — LOSARTAN POTASSIUM 100 MG PO TABS: 100.0000 mg | ORAL_TABLET | Freq: Every day | ORAL | 0 refills | Status: DC

## 2016-08-17 MED ORDER — HYDROCOD POLST-CPM POLST ER 10-8 MG/5ML PO SUER: 5.0000 mL | Freq: Two times a day (BID) | ORAL | 0 refills | Status: DC | PRN

## 2016-08-17 MED ORDER — FLUTICASONE PROPIONATE 50 MCG/ACT NA SUSP: 2.0000 | Freq: Every day | NASAL | 11 refills | Status: DC

## 2016-08-17 MED ORDER — CETIRIZINE HCL 10 MG PO TABS: 10.0000 mg | ORAL_TABLET | Freq: Every day | ORAL | 11 refills | Status: DC

## 2016-08-17 NOTE — Patient Instructions (Signed)
Take sudafed as directed on box for 3-4 days Start zyrtec daily and flonase twice daily long term Discontinue lisinopril, start losartan. Continue to check home BPs and let me know if having persistent abnormals (anything below 100/60 or above 140/90) with goal of around 120/80

## 2016-08-17 NOTE — Progress Notes (Signed)
BP 126/81   Pulse 79   Temp 98.5 F (36.9 C)   Wt 149 lb (67.6 kg)   LMP  (LMP Unknown)   SpO2 98%   BMI 29.10 kg/m    Subjective:    Patient ID: Deanna Velez, female    DOB: August 17, 1960, 56 y.o.   MRN: [id number]  HPI: Deanna Velez is a 56 y.o. female  Chief Complaint  Patient presents with  . Hypertension    Was started on Lisinopril 10mg  at his last visit.   Marland Kitchen Cough    has had a cough off and on for a long time. Worse over the last few weeks. Occasionally productive. Worse at night, feels like she has a "drip" in the back of her throat. Wonders if she has a chronic sinus issues.    Patient presents for 1 month BP check after starting 10 mg lisinopril. 120s/130s/high 80s when checking at home. No longer having the HAs and other sxs related to elevated BP that she was having prior to the medication. Has been working on [employer] and low carb diet which has helped tremendously. Still working on continued smoking cessation. No noted side effects other than a possibly unrelated worsening cough. States she has had it for months, and will cyclicly get this cough from a constant post-nasal drip. Worst at night, lately has been keeping her and her husband awake overnight. Productive of clear, thin sputum. Also having sinus pressure, ear fullness, and intermittent scratchy throat. Denies SOB, fevers, wheezing. Has not been trying anything for sxs.   Past Medical History:  Diagnosis Date  . Gallbladder attack   . Lack of libido   . PMB (postmenopausal bleeding)   . Ringworm   . Vitamin D deficiency disease    Social History   Social History  . Marital status: Married    Spouse name: N/A  . Number of children: N/A  . Years of education: N/A   Occupational History  . Not on file.   Social History Main Topics  . Smoking status: Former Smoker    Packs/day: 1.00    Types: Cigarettes    Quit date: 05/22/2016  . Smokeless tobacco: Never Used  . Alcohol use 0.0 oz/week     Comment: beer on  the weekends  . Drug use: No  . Sexual activity: Yes    Birth control/ protection: Surgical     Comment: tubal    Other Topics Concern  . Not on file   Social History Narrative  . No narrative on file    Relevant past medical, surgical, family and social history reviewed and updated as indicated. Interim medical history since our last visit reviewed. Allergies and medications reviewed and updated.  Review of Systems  Constitutional: Negative.   HENT: Positive for ear pain, postnasal drip, rhinorrhea, sinus pressure and sore throat.   Eyes: Negative.   Respiratory: Positive for cough.   Cardiovascular: Negative.   Gastrointestinal: Negative.   Genitourinary: Negative.   Musculoskeletal: Negative.   Neurological: Negative.   Psychiatric/Behavioral: Negative.     Per HPI unless specifically indicated above     Objective:    BP 126/81   Pulse 79   Temp 98.5 F (36.9 C)   Wt 149 lb (67.6 kg)   LMP  (LMP Unknown)   SpO2 98%   BMI 29.10 kg/m   Wt Readings from Last 3 Encounters:  08/17/16 149 lb (67.6 kg)  07/11/16 153 lb (69.4 kg)  04/29/16 149  lb 14.4 oz (68 kg)    Physical Exam  Constitutional: She is oriented to person, place, and time. She appears well-developed and well-nourished. No distress.  HENT:  Head: Atraumatic.  B/l mild middle ear effusion Nasal mucosa boggy and erythematous Oropharynx mildly erythematous posteriorly  Eyes: Conjunctivae are normal. Pupils are equal, round, and reactive to light.  Neck: Normal range of motion. Neck supple.  Cardiovascular: Normal rate and normal heart sounds.   Pulmonary/Chest: Effort normal and breath sounds normal. No respiratory distress.  Musculoskeletal: Normal range of motion.  Lymphadenopathy:    She has no cervical adenopathy.  Neurological: She is alert and oriented to person, place, and time.  Skin: Skin is warm and dry.  Psychiatric: She has a normal mood and affect. Her behavior is normal.  Nursing  note and vitals reviewed.     Assessment & Plan:   Problem List Items Addressed This Visit      Cardiovascular and Mediastinum   Hypertension - Primary    Discussed with pt potential cough side effects with lisinopril, and while we don't believe this is causing her current sxs pt opting to switch medications. Will switch to 100 mg losartan. Discussed continued home BP checks, will f/u with persistent abnormal readings. F/u in 3 months for recheck if readings WNL at home in meantime.       Relevant Medications   losartan (COZAAR) 100 MG tablet     Respiratory   Allergic rhinitis    Suspect her cyclic post nasal drip/cough is d/t allergies. Will start a good allergy regimen and reassess. Tussionex given to help in the immediate with her night cough sxs. Start flonase and zyrtec daily. Reviewed proper flonase technique. Precautions, risks, benefits reviewed with these medications. Pt agreeable and verbalizes understanding.           Follow up plan: Return for CPE, BP f/u.

## 2016-08-18 DIAGNOSIS — J309 Allergic rhinitis, unspecified: Secondary | ICD-10-CM | POA: Insufficient documentation

## 2016-08-18 NOTE — Assessment & Plan Note (Signed)
Suspect her cyclic post nasal drip/cough is d/t allergies. Will start a good allergy regimen and reassess. Tussionex given to help in the immediate with her night cough sxs. Start flonase and zyrtec daily. Reviewed proper flonase technique. Precautions, risks, benefits reviewed with these medications. Pt agreeable and verbalizes understanding.

## 2016-08-18 NOTE — Assessment & Plan Note (Signed)
Discussed with pt potential cough side effects with lisinopril, and while we don't believe this is causing her current sxs pt opting to switch medications. Will switch to 100 mg losartan. Discussed continued home BP checks, will f/u with persistent abnormal readings. F/u in 3 months for recheck if readings WNL at home in meantime.

## 2016-08-29 ENCOUNTER — Telehealth: Payer: Self-pay | Admitting: Family Medicine

## 2016-08-29 MED ORDER — BENZONATATE 200 MG PO CAPS: 200.0000 mg | ORAL_CAPSULE | Freq: Three times a day (TID) | ORAL | 0 refills | Status: DC | PRN

## 2016-08-29 NOTE — Telephone Encounter (Signed)
Patient notified

## 2016-08-29 NOTE — Telephone Encounter (Signed)
Pt called and stated that her cough has came back. She also stated that her BP is great now that her BP medication has been increased.

## 2016-08-29 NOTE — Telephone Encounter (Signed)
Routing to provider for advice.

## 2016-08-29 NOTE — Telephone Encounter (Signed)
Great to hear about her BP. Have her take tessalon perles in addition to the tussionex and continue the allergy regimen. F/u if still not helping

## 2016-11-22 ENCOUNTER — Ambulatory Visit (INDEPENDENT_AMBULATORY_CARE_PROVIDER_SITE_OTHER): Payer: BC Managed Care – PPO | Admitting: Unknown Physician Specialty

## 2016-11-22 ENCOUNTER — Encounter: Payer: Self-pay | Admitting: Unknown Physician Specialty

## 2016-11-22 VITALS — BP 131/85 | HR 101 | Temp 98.5°F | Ht 59.6 in | Wt 151.5 lb

## 2016-11-22 DIAGNOSIS — Z23 Encounter for immunization: Secondary | ICD-10-CM | POA: Diagnosis not present

## 2016-11-22 DIAGNOSIS — Z1211 Encounter for screening for malignant neoplasm of colon: Secondary | ICD-10-CM

## 2016-11-22 DIAGNOSIS — Z0000 Encounter for general adult medical examination without abnormal findings: Secondary | ICD-10-CM | POA: Diagnosis not present

## 2016-11-22 DIAGNOSIS — I10 Essential (primary) hypertension: Secondary | ICD-10-CM

## 2016-11-22 MED ORDER — LOSARTAN POTASSIUM 100 MG PO TABS: 100.0000 mg | ORAL_TABLET | Freq: Every day | ORAL | 0 refills | Status: DC

## 2016-11-22 NOTE — Progress Notes (Signed)
BP 131/85 (BP Location: Left Arm, Cuff Size: Normal)   Pulse (!) 101   Temp 98.5 F (36.9 C)   Ht 4' 11.6" (1.514 m)   Wt 151 lb 8 oz (68.7 kg)   LMP  (LMP Unknown)   SpO2 98%   BMI 29.99 kg/m    Subjective:    Patient ID: Deanna Velez, female    DOB: 05-20-1960, 56 y.o.   MRN: [id number]  HPI: Deanna Velez is a 56 y.o. female  Chief Complaint  Patient presents with  . Annual Exam  . Hypertension    pt states that she feels like her BP medication is not working as well as it was, states her BP has been a little elevated and so has her pulse   Hypertension Using medications without difficulty.  She is having a fairly high pulse and noticed BP at home is "creeping up." Average home BPs 140/90   No problems or lightheadedness No chest pain with exertion or shortness of breath No Edema  Social History   Social History  . Marital status: Married    Spouse name: N/A  . Number of children: N/A  . Years of education: N/A   Occupational History  . Not on file.   Social History Main Topics  . Smoking status: Current Every Day Smoker    Packs/day: 0.25    Types: Cigarettes    Last attempt to quit: 05/22/2016  . Smokeless tobacco: Never Used  . Alcohol use 0.0 oz/week     Comment: beer on the weekends  . Drug use: No  . Sexual activity: Yes    Birth control/ protection: Surgical     Comment: tubal    Other Topics Concern  . Not on file   Social History Narrative  . No narrative on file   Family History  Problem Relation Age of Onset  . Hyperlipidemia Mother   . Diabetes Mother   . Heart disease Mother   . Cancer Sister        leukemia  . Diabetes Sister   . Diabetes Maternal Grandmother   . Hyperlipidemia Sister   . Diabetes Sister   . Stroke Sister   . Breast cancer Neg Hx    Past Medical History:  Diagnosis Date  . Gallbladder attack   . Lack of libido   . PMB (postmenopausal bleeding)   . Ringworm   . Vitamin D deficiency disease    Past Surgical  History:  Procedure Laterality Date  . TOE SURGERY Right 05/2014  . TONSILLECTOMY    . TUBAL LIGATION      Relevant past medical, surgical, family and social history reviewed and updated as indicated. Interim medical history since our last visit reviewed. Allergies and medications reviewed and updated.  Review of Systems  Constitutional: Negative.   HENT: Negative.   Eyes: Negative.   Respiratory: Negative.   Cardiovascular: Negative.   Gastrointestinal: Negative.   Endocrine: Negative.   Genitourinary: Negative.   Musculoskeletal: Negative.   Skin: Negative.   Allergic/Immunologic: Negative.   Neurological: Negative.   Hematological: Negative.   Psychiatric/Behavioral: Negative.     Per HPI unless specifically indicated above     Objective:    BP 131/85 (BP Location: Left Arm, Cuff Size: Normal)   Pulse (!) 101   Temp 98.5 F (36.9 C)   Ht 4' 11.6" (1.514 m)   Wt 151 lb 8 oz (68.7 kg)   LMP  (LMP Unknown)   SpO2  98%   BMI 29.99 kg/m   Wt Readings from Last 3 Encounters:  11/22/16 151 lb 8 oz (68.7 kg)  08/17/16 149 lb (67.6 kg)  07/11/16 153 lb (69.4 kg)    Physical Exam  Constitutional: She is oriented to person, place, and time. She appears well-developed and well-nourished.  HENT:  Head: Normocephalic and atraumatic.  Eyes: Pupils are equal, round, and reactive to light. Right eye exhibits no discharge. Left eye exhibits no discharge. No scleral icterus.  Neck: Normal range of motion. Neck supple. Carotid bruit is not present. No thyromegaly present.  Cardiovascular: Normal rate, regular rhythm and normal heart sounds.  Exam reveals no gallop and no friction rub.   No murmur heard. Pulmonary/Chest: Effort normal and breath sounds normal. No respiratory distress. She has no wheezes. She has no rales.  Abdominal: Soft. Bowel sounds are normal. There is no tenderness. There is no rebound.  Genitourinary: No breast swelling, tenderness or discharge.    Musculoskeletal: Normal range of motion.  Lymphadenopathy:    She has no cervical adenopathy.  Neurological: She is alert and oriented to person, place, and time.  Skin: Skin is warm, dry and intact. No rash noted.  Psychiatric: She has a normal mood and affect. Her speech is normal and behavior is normal. Judgment and thought content normal. Cognition and memory are normal.    Results for orders placed or performed in visit on 04/29/16  Thyroid Panel With TSH  Result Value Ref Range   TSH 0.637 0.450 - 4.500 uIU/mL   T4, Total 6.4 4.5 - 12.0 ug/dL   T3 Uptake Ratio 27 24 - 39 %   Free Thyroxine Index 1.7 1.2 - 4.9  Comprehensive metabolic panel  Result Value Ref Range   Glucose 105 (H) 65 - 99 mg/dL   BUN 11 6 - 24 mg/dL   Creatinine, Ser 0.73 0.57 - 1.00 mg/dL   GFR calc non Af Amer 94 >59 mL/min/1.73   GFR calc Af Amer 108 >59 mL/min/1.73   BUN/Creatinine Ratio 15 9 - 23   Sodium 141 134 - 144 mmol/L   Potassium 4.5 3.5 - 5.2 mmol/L   Chloride 101 96 - 106 mmol/L   CO2 21 18 - 29 mmol/L   Calcium 10.6 (H) 8.7 - 10.2 mg/dL   Total Protein 7.6 6.0 - 8.5 g/dL   Albumin 5.1 3.5 - 5.5 g/dL   Globulin, Total 2.5 1.5 - 4.5 g/dL   Albumin/Globulin Ratio 2.0 1.2 - 2.2   Bilirubin Total 0.4 0.0 - 1.2 mg/dL   Alkaline Phosphatase 69 39 - 117 IU/L   AST 29 0 - 40 IU/L   ALT 24 0 - 32 IU/L  Lipid panel  Result Value Ref Range   Cholesterol, Total 186 100 - 199 mg/dL   Triglycerides 86 0 - 149 mg/dL   HDL 71 >39 mg/dL   VLDL Cholesterol Cal 17 5 - 40 mg/dL   LDL Calculated 98 0 - 99 mg/dL   Chol/HDL Ratio 2.6 0.0 - 4.4 ratio units  Vitamin D (25 hydroxy)  Result Value Ref Range   Vit D, 25-Hydroxy 30.5 30.0 - 100.0 ng/mL  POCT urinalysis dipstick  Result Value Ref Range   Color, UA yellow    Clarity, UA clear    Glucose, UA neg    Bilirubin, UA neg    Ketones, UA neg    Spec Grav, UA 1.015    Blood, UA neg    pH, UA 6.0  Protein, UA neg    Urobilinogen, UA 0.2     Nitrite, UA neg    Leukocytes, UA Negative Negative      Assessment & Plan:   Problem List Items Addressed This Visit      Unprioritized   Hypertension    Not to goal.  Heart rate is also elevated.  Shared decision making to start the DASH plan.        Relevant Medications   losartan (COZAAR) 100 MG tablet   Other Relevant Orders   Comprehensive metabolic panel   Lipid Panel w/o Chol/HDL Ratio    Other Visit Diagnoses    Need for influenza vaccination    -  Primary   Relevant Orders   Flu Vaccine QUAD 36+ mos IM (Completed)   Colon cancer screening       Relevant Orders   Ambulatory referral to Gastroenterology   Annual physical exam       Relevant Orders   TSH   CBC with Differential/Platelet       Follow up plan: Return in about 3 months (around 02/22/2017) for for BP with Apolonio Schneiders.

## 2016-11-22 NOTE — Assessment & Plan Note (Addendum)
Not to goal.  Heart rate is also elevated.  Shared decision making to start the DASH plan.

## 2016-11-22 NOTE — Patient Instructions (Addendum)
Influenza (Flu) Vaccine (Inactivated or Recombinant): What You Need to Know 1. Why get vaccinated? Influenza ("flu") is a contagious disease that spreads around the [place] every year, usually between October and May. Flu is caused by influenza viruses, and is spread mainly by coughing, sneezing, and close contact. Anyone can get flu. Flu strikes suddenly and can last several days. Symptoms vary by age, but can include:  fever/chills  sore throat  muscle aches  fatigue  cough  headache  runny or stuffy nose  Flu can also lead to pneumonia and blood infections, and cause diarrhea and seizures in children. If you have a medical condition, such as heart or lung disease, flu can make it worse. Flu is more dangerous for some people. Infants and young children, people 23 years of age and older, pregnant women, and people with certain health conditions or a weakened immune system are at greatest risk. Each year thousands of people in the [place] die from flu, and many more are hospitalized. Flu vaccine can:  keep you from getting flu,  make flu less severe if you do get it, and  keep you from spreading flu to your family and other people. 2. Inactivated and recombinant flu vaccines A dose of flu vaccine is recommended every flu season. Children 6 months through 91 years of age may need two doses during the same flu season. Everyone else needs only one dose each flu season. Some inactivated flu vaccines contain a very small amount of a mercury-based preservative called thimerosal. Studies have not shown thimerosal in vaccines to be harmful, but flu vaccines that do not contain thimerosal are available. There is no live flu virus in flu shots. They cannot cause the flu. There are many flu viruses, and they are always changing. Each year a new flu vaccine is made to protect against three or four viruses that are likely to cause disease in the upcoming flu season. But even when the  vaccine doesn't exactly match these viruses, it may still provide some protection. Flu vaccine cannot prevent:  flu that is caused by a virus not covered by the vaccine, or  illnesses that look like flu but are not.  It takes about 2 weeks for protection to develop after vaccination, and protection lasts through the flu season. 3. Some people should not get this vaccine Tell the person who is giving you the vaccine:  If you have any severe, life-threatening allergies. If you ever had a life-threatening allergic reaction after a dose of flu vaccine, or have a severe allergy to any part of this vaccine, you may be advised not to get vaccinated. Most, but not all, types of flu vaccine contain a small amount of egg protein.  If you ever had Guillain-Barr Syndrome (also called GBS). Some people with a history of GBS should not get this vaccine. This should be discussed with your doctor.  If you are not feeling well. It is usually okay to get flu vaccine when you have a mild illness, but you might be asked to come back when you feel better.  4. Risks of a vaccine reaction With any medicine, including vaccines, there is a chance of reactions. These are usually mild and go away on their own, but serious reactions are also possible. Most people who get a flu shot do not have any problems with it. Minor problems following a flu shot include:  soreness, redness, or swelling where the shot was given  hoarseness  sore,  red or itchy eyes  cough  fever  aches  headache  itching  fatigue  If these problems occur, they usually begin soon after the shot and last 1 or 2 days. More serious problems following a flu shot can include the following:  There may be a small increased risk of Guillain-Barre Syndrome (GBS) after inactivated flu vaccine. This risk has been estimated at 1 or 2 additional cases per million people vaccinated. This is much lower than the risk of severe complications from  flu, which can be prevented by flu vaccine.  Young children who get the flu shot along with pneumococcal vaccine (PCV13) and/or DTaP vaccine at the same time might be slightly more likely to have a seizure caused by fever. Ask your doctor for more information. Tell your doctor if a child who is getting flu vaccine has ever had a seizure.  Problems that could happen after any injected vaccine:  People sometimes faint after a medical procedure, including vaccination. Sitting or lying down for about 15 minutes can help prevent fainting, and injuries caused by a fall. Tell your doctor if you feel dizzy, or have vision changes or ringing in the ears.  Some people get severe pain in the shoulder and have difficulty moving the arm where a shot was given. This happens very rarely.  Any medication can cause a severe allergic reaction. Such reactions from a vaccine are very rare, estimated at about 1 in a million doses, and would happen within a few minutes to a few hours after the vaccination. As with any medicine, there is a very remote chance of a vaccine causing a serious injury or death. The safety of vaccines is always being monitored. For more information, visit: http://www.aguilar.org/ 5. What if there is a serious reaction? What should I look for? Look for anything that concerns you, such as signs of a severe allergic reaction, very high fever, or unusual behavior. Signs of a severe allergic reaction can include hives, swelling of the face and throat, difficulty breathing, a fast heartbeat, dizziness, and weakness. These would start a few minutes to a few hours after the vaccination. What should I do?  If you think it is a severe allergic reaction or other emergency that can't wait, call 9-1-1 and get the person to the nearest hospital. Otherwise, call your doctor.  Reactions should be reported to the Vaccine Adverse Event Reporting System (VAERS). Your doctor should file this report, or you  can do it yourself through the VAERS web site at www.vaers.SamedayNews.es, or by calling 6094730752. ? VAERS does not give medical advice. 6. The National Vaccine Injury Compensation Program The [employer] Vaccine Injury Compensation Program (VICP) is a federal program that was created to compensate people who may have been injured by certain vaccines. Persons who believe they may have been injured by a vaccine can learn about the program and about filing a claim by calling 458-267-6070 or visiting the [place] website at GoldCloset.com.ee. There is a time limit to file a claim for compensation. 7. How can I learn more?  Ask your healthcare provider. He or she can give you the vaccine package insert or suggest other sources of information.  Call your local or state health department.  Contact the Centers for Disease Control and Prevention (CDC): ? Call (540)164-9661 (1-800-CDC-INFO) or ? Visit CDC's website at https://gibson.com/ Vaccine Information Statement, Inactivated Influenza Vaccine (09/27/2013) This information is not intended to replace advice given to you by your health care provider. Make sure  you discuss any questions you have with your health care provider.  DASH Eating Plan DASH stands for "Dietary Approaches to Stop Hypertension." The DASH eating plan is a healthy eating plan that has been shown to reduce high blood pressure (hypertension). It may also reduce your risk for type 2 diabetes, heart disease, and stroke. The DASH eating plan may also help with weight loss. What are tips for following this plan? General guidelines  Avoid eating more than 2,300 mg (milligrams) of salt (sodium) a day. If you have hypertension, you may need to reduce your sodium intake to 1,500 mg a day.  Limit alcohol intake to no more than 1 drink a day for nonpregnant women and 2 drinks a day for men. One drink equals 12 oz of beer, 5 oz of wine, or 1 oz of hard liquor.  Work with your health  care provider to maintain a healthy body weight or to lose weight. Ask what an ideal weight is for you.  Get at least 30 minutes of exercise that causes your heart to beat faster (aerobic exercise) most days of the week. Activities may include walking, swimming, or biking.  Work with your health care provider or diet and nutrition specialist (dietitian) to adjust your eating plan to your individual calorie needs. Reading food labels  Check food labels for the amount of sodium per serving. Choose foods with less than 5 percent of the Daily Value of sodium. Generally, foods with less than 300 mg of sodium per serving fit into this eating plan.  To find whole grains, look for the word "whole" as the first word in the ingredient list. Shopping  Buy products labeled as "low-sodium" or "no salt added."  Buy fresh foods. Avoid canned foods and premade or frozen meals. Cooking  Avoid adding salt when cooking. Use salt-free seasonings or herbs instead of table salt or sea salt. Check with your health care provider or pharmacist before using salt substitutes.  Do not fry foods. Cook foods using healthy methods such as baking, boiling, grilling, and broiling instead.  Cook with heart-healthy oils, such as olive, canola, soybean, or sunflower oil. Meal planning   Eat a balanced diet that includes: ? 5 or more servings of fruits and vegetables each day. At each meal, try to fill half of your plate with fruits and vegetables. ? Up to 6-8 servings of whole grains each day. ? Less than 6 oz of lean meat, poultry, or fish each day. A 3-oz serving of meat is about the same size as a deck of cards. One egg equals 1 oz. ? 2 servings of low-fat dairy each day. ? A serving of nuts, seeds, or beans 5 times each week. ? Heart-healthy fats. Healthy fats called Omega-3 fatty acids are found in foods such as flaxseeds and coldwater fish, like sardines, salmon, and mackerel.  Limit how much you eat of the  following: ? Canned or prepackaged foods. ? Food that is high in trans fat, such as fried foods. ? Food that is high in saturated fat, such as fatty meat. ? Sweets, desserts, sugary drinks, and other foods with added sugar. ? Full-fat dairy products.  Do not salt foods before eating.  Try to eat at least 2 vegetarian meals each week.  Eat more home-cooked food and less restaurant, buffet, and fast food.  When eating at a restaurant, ask that your food be prepared with less salt or no salt, if possible. What foods are recommended? The items  listed may not be a complete list. Talk with your dietitian about what dietary choices are best for you. Grains Whole-grain or whole-wheat bread. Whole-grain or whole-wheat pasta. Brown rice. [place]. Bulgur. Whole-grain and low-sodium cereals. Pita bread. Low-fat, low-sodium crackers. Whole-wheat flour tortillas. Vegetables Fresh or frozen vegetables (raw, steamed, roasted, or grilled). Low-sodium or reduced-sodium tomato and vegetable juice. Low-sodium or reduced-sodium tomato sauce and tomato paste. Low-sodium or reduced-sodium canned vegetables. Fruits All fresh, dried, or frozen fruit. Canned fruit in natural juice (without added sugar). Meat and other protein foods Skinless chicken or [place]. Ground chicken or [place]. Pork with fat trimmed off. Fish and seafood. Egg whites. Dried beans, peas, or lentils. Unsalted nuts, nut butters, and seeds. Unsalted canned beans. Lean cuts of beef with fat trimmed off. Low-sodium, lean deli meat. Dairy Low-fat (1%) or fat-free (skim) milk. Fat-free, low-fat, or reduced-fat cheeses. Nonfat, low-sodium ricotta or cottage cheese. Low-fat or nonfat yogurt. Low-fat, low-sodium cheese. Fats and oils Soft margarine without trans fats. Vegetable oil. Low-fat, reduced-fat, or light mayonnaise and salad dressings (reduced-sodium). Canola, safflower, olive, soybean, and sunflower oils. Avocado. Seasoning and other  foods Herbs. Spices. Seasoning mixes without salt. Unsalted popcorn and pretzels. Fat-free sweets. What foods are not recommended? The items listed may not be a complete list. Talk with your dietitian about what dietary choices are best for you. Grains Baked goods made with fat, such as croissants, muffins, or some breads. Dry pasta or rice meal packs. Vegetables Creamed or fried vegetables. Vegetables in a cheese sauce. Regular canned vegetables (not low-sodium or reduced-sodium). Regular canned tomato sauce and paste (not low-sodium or reduced-sodium). Regular tomato and vegetable juice (not low-sodium or reduced-sodium). Angie Fava. Olives. Fruits Canned fruit in a light or heavy syrup. Fried fruit. Fruit in cream or butter sauce. Meat and other protein foods Fatty cuts of meat. Ribs. Fried meat. Berniece Salines. Sausage. Bologna and other processed lunch meats. Salami. Fatback. Hotdogs. Bratwurst. Salted nuts and seeds. Canned beans with added salt. Canned or smoked fish. Whole eggs or egg yolks. Chicken or [place] with skin. Dairy Whole or 2% milk, cream, and half-and-half. Whole or full-fat cream cheese. Whole-fat or sweetened yogurt. Full-fat cheese. Nondairy creamers. Whipped toppings. Processed cheese and cheese spreads. Fats and oils Butter. Stick margarine. Lard. Shortening. Ghee. Bacon fat. Tropical oils, such as coconut, palm kernel, or palm oil. Seasoning and other foods Salted popcorn and pretzels. Onion salt, garlic salt, seasoned salt, table salt, and sea salt. Worcestershire sauce. Tartar sauce. Barbecue sauce. Teriyaki sauce. Soy sauce, including reduced-sodium. Steak sauce. Canned and packaged gravies. Fish sauce. Oyster sauce. Cocktail sauce. Horseradish that you find on the shelf. Ketchup. Mustard. Meat flavorings and tenderizers. Bouillon cubes. Hot sauce and Tabasco sauce. Premade or packaged marinades. Premade or packaged taco seasonings. Relishes. Regular salad dressings. Where to find  more information:  National Heart, Lung, and [place]: https://wilson-eaton.com/  American Heart Association: www.heart.org Summary  The DASH eating plan is a healthy eating plan that has been shown to reduce high blood pressure (hypertension). It may also reduce your risk for type 2 diabetes, heart disease, and stroke.  With the DASH eating plan, you should limit salt (sodium) intake to 2,300 mg a day. If you have hypertension, you may need to reduce your sodium intake to 1,500 mg a day.  When on the DASH eating plan, aim to eat more fresh fruits and vegetables, whole grains, lean proteins, low-fat dairy, and heart-healthy fats.  Work with your health care provider or  diet and nutrition specialist (dietitian) to adjust your eating plan to your individual calorie needs. This information is not intended to replace advice given to you by your health care provider. Make sure you discuss any questions you have with your health care provider. Document Released: 01/27/2011 Document Revised: 02/01/2016 Document Reviewed: 02/01/2016 Elsevier Interactive Patient Education  2017 [employer].

## 2016-11-23 ENCOUNTER — Encounter: Payer: Self-pay | Admitting: Unknown Physician Specialty

## 2016-11-23 LAB — CBC WITH DIFFERENTIAL/PLATELET
BASOS: 1 %
Basophils Absolute: 0 10*3/uL (ref 0.0–0.2)
EOS (ABSOLUTE): 0.1 10*3/uL (ref 0.0–0.4)
Eos: 1 %
HEMOGLOBIN: 12.7 g/dL (ref 11.1–15.9)
Hematocrit: 40.1 % (ref 34.0–46.6)
IMMATURE GRANS (ABS): 0 10*3/uL (ref 0.0–0.1)
IMMATURE GRANULOCYTES: 0 %
LYMPHS: 28 %
Lymphocytes Absolute: 2.5 10*3/uL (ref 0.7–3.1)
MCH: 29.3 pg (ref 26.6–33.0)
MCHC: 31.7 g/dL (ref 31.5–35.7)
MCV: 92 fL (ref 79–97)
Monocytes Absolute: 0.5 10*3/uL (ref 0.1–0.9)
Monocytes: 6 %
NEUTROS ABS: 5.6 10*3/uL (ref 1.4–7.0)
Neutrophils: 64 %
PLATELETS: 311 10*3/uL (ref 150–379)
RBC: 4.34 x10E6/uL (ref 3.77–5.28)
RDW: 13.9 % (ref 12.3–15.4)
WBC: 8.7 10*3/uL (ref 3.4–10.8)

## 2016-11-23 LAB — COMPREHENSIVE METABOLIC PANEL
ALBUMIN: 4.9 g/dL (ref 3.5–5.5)
ALK PHOS: 71 IU/L (ref 39–117)
ALT: 34 IU/L — ABNORMAL HIGH (ref 0–32)
AST: 40 IU/L (ref 0–40)
Albumin/Globulin Ratio: 2.1 (ref 1.2–2.2)
BUN / CREAT RATIO: 20 (ref 9–23)
BUN: 16 mg/dL (ref 6–24)
Bilirubin Total: 0.4 mg/dL (ref 0.0–1.2)
CO2: 20 mmol/L (ref 20–29)
CREATININE: 0.8 mg/dL (ref 0.57–1.00)
Calcium: 10 mg/dL (ref 8.7–10.2)
Chloride: 103 mmol/L (ref 96–106)
GFR calc Af Amer: 96 mL/min/{1.73_m2} (ref >59)
GFR calc non Af Amer: 83 mL/min/{1.73_m2} (ref >59)
GLUCOSE: 92 mg/dL (ref 65–99)
Globulin, Total: 2.3 g/dL (ref 1.5–4.5)
Potassium: 4.3 mmol/L (ref 3.5–5.2)
Sodium: 141 mmol/L (ref 134–144)
TOTAL PROTEIN: 7.2 g/dL (ref 6.0–8.5)

## 2016-11-23 LAB — LIPID PANEL W/O CHOL/HDL RATIO
CHOLESTEROL TOTAL: 196 mg/dL (ref 100–199)
HDL: 79 mg/dL (ref >39)
LDL CALC: 103 mg/dL — AB (ref 0–99)
TRIGLYCERIDES: 71 mg/dL (ref 0–149)
VLDL Cholesterol Cal: 14 mg/dL (ref 5–40)

## 2016-11-23 LAB — TSH: TSH: 0.663 u[IU]/mL (ref 0.450–4.500)

## 2016-12-22 ENCOUNTER — Encounter: Payer: Self-pay | Admitting: Family Medicine

## 2016-12-22 ENCOUNTER — Ambulatory Visit (INDEPENDENT_AMBULATORY_CARE_PROVIDER_SITE_OTHER): Payer: BC Managed Care – PPO | Admitting: Family Medicine

## 2016-12-22 VITALS — BP 121/84 | HR 85 | Temp 98.1°F | Wt 151.0 lb

## 2016-12-22 DIAGNOSIS — B9789 Other viral agents as the cause of diseases classified elsewhere: Secondary | ICD-10-CM

## 2016-12-22 DIAGNOSIS — J069 Acute upper respiratory infection, unspecified: Secondary | ICD-10-CM

## 2016-12-22 MED ORDER — BENZONATATE 200 MG PO CAPS: 200.0000 mg | ORAL_CAPSULE | Freq: Two times a day (BID) | ORAL | 0 refills | Status: DC | PRN

## 2016-12-22 MED ORDER — HYDROCOD POLST-CPM POLST ER 10-8 MG/5ML PO SUER: 5.0000 mL | Freq: Two times a day (BID) | ORAL | 0 refills | Status: DC | PRN

## 2016-12-22 NOTE — Patient Instructions (Signed)
Coricidin HBP products for cough and cold

## 2016-12-22 NOTE — Progress Notes (Signed)
   BP 121/84   Pulse 85   Temp 98.1 F (36.7 C)   Wt 151 lb (68.5 kg)   LMP  (LMP Unknown)   SpO2 97%   BMI 29.89 kg/m    Subjective:    Patient ID: Deanna Velez, female    DOB: Jul 09, 1960, 56 y.o.   MRN: [id number]  HPI: Deanna Velez is a 56 y.o. female  Chief Complaint  Patient presents with  . URI    x 4 days, head congestion, sinus drainage, sore throat, occ. productive cough, some left ear pressure, runny nose, sneezing. No fever. No chest congestion.   Head and chest congestion, facial pain and pressure, productive cough, sneezing x 4 days. Denies fevers, body aches, CP, SOB. Has not been trying anything OTC. Husband sick with similar sxs. Cough is most bothersome symptom for her, keeping her up at night.   Relevant past medical, surgical, family and social history reviewed and updated as indicated. Interim medical history since our last visit reviewed. Allergies and medications reviewed and updated.  Review of Systems  Constitutional: Negative.   HENT: Positive for congestion, sinus pressure, sinus pain and sneezing.   Eyes: Negative.   Respiratory: Positive for cough.   Cardiovascular: Negative.   Gastrointestinal: Negative.   Genitourinary: Negative.   Musculoskeletal: Negative.   Neurological: Negative.   Psychiatric/Behavioral: Negative.     Per HPI unless specifically indicated above     Objective:    BP 121/84   Pulse 85   Temp 98.1 F (36.7 C)   Wt 151 lb (68.5 kg)   LMP  (LMP Unknown)   SpO2 97%   BMI 29.89 kg/m   Wt Readings from Last 3 Encounters:  12/22/16 151 lb (68.5 kg)  11/22/16 151 lb 8 oz (68.7 kg)  08/17/16 149 lb (67.6 kg)    Physical Exam  Constitutional: She is oriented to person, place, and time. She appears well-developed and well-nourished. No distress.  HENT:  Head: Atraumatic.  Oropharynx and nasal mucosa erythematous Drainage present in nares  Eyes: Conjunctivae are normal. Pupils are equal, round, and reactive to light.    Neck: Normal range of motion. Neck supple.  Cardiovascular: Normal rate and normal heart sounds.  Pulmonary/Chest: Effort normal and breath sounds normal. No respiratory distress. She has no wheezes.  Musculoskeletal: Normal range of motion.  Neurological: She is alert and oriented to person, place, and time.  Skin: Skin is warm and dry.  Psychiatric: She has a normal mood and affect. Her behavior is normal.  Nursing note and vitals reviewed.     Assessment & Plan:   Problem List Items Addressed This Visit    None    Visit Diagnoses    Viral URI with cough    -  Primary   Will treat with tessalon and tussionex, continue OTC supportive care, humidifier, sleep propped up. F/u if no improvement       Follow up plan: Return if symptoms worsen or fail to improve.

## 2016-12-30 ENCOUNTER — Ambulatory Visit: Payer: BC Managed Care – PPO | Admitting: Family Medicine

## 2016-12-30 ENCOUNTER — Encounter: Payer: Self-pay | Admitting: Family Medicine

## 2016-12-30 VITALS — BP 121/80 | HR 73 | Temp 97.7°F | Wt 151.7 lb

## 2016-12-30 DIAGNOSIS — R1032 Left lower quadrant pain: Secondary | ICD-10-CM | POA: Diagnosis not present

## 2016-12-30 MED ORDER — BUDESONIDE-FORMOTEROL FUMARATE 160-4.5 MCG/ACT IN AERO: 2.0000 | INHALATION_SPRAY | Freq: Two times a day (BID) | RESPIRATORY_TRACT | 3 refills | Status: DC

## 2016-12-30 MED ORDER — CYCLOBENZAPRINE HCL 10 MG PO TABS: 10.0000 mg | ORAL_TABLET | Freq: Three times a day (TID) | ORAL | 0 refills | Status: DC | PRN

## 2016-12-30 NOTE — Progress Notes (Signed)
BP 121/80 (BP Location: Right Arm, Patient Position: Sitting, Cuff Size: Normal)   Pulse 73   Temp 97.7 F (36.5 C) (Temporal)   Wt 151 lb 11.2 oz (68.8 kg)   LMP  (LMP Unknown)   SpO2 94%   BMI 30.03 kg/m    Subjective:    Patient ID: Deanna Velez, female    DOB: 21-Oct-1960, 56 y.o.   MRN: [id number]  HPI: Deanna Velez is a 56 y.o. female  Chief Complaint  Patient presents with  . Abdominal Pain    Started last week after she got sick. If patient coughs, sneezes etc will have severe pain in the middle of her belly near belly button. Patient states it will bring her to tears.    Patient presents today with b/l lower abdominal pain that started after severe coughing with a URI the past week or two. The pain seems to only come on after sneezing or coughing spells now. Pain is so severe it brings her to tears, wakes her up at night on occasion. Taking aspirin before going to bed but not noticing much benefit. Denies N/V/D, tolerating PO well.   Relevant past medical, surgical, family and social history reviewed and updated as indicated. Interim medical history since our last visit reviewed. Allergies and medications reviewed and updated.  Review of Systems  Constitutional: Negative.   HENT: Negative.   Respiratory: Negative.   Cardiovascular: Negative.   Gastrointestinal: Positive for abdominal pain.  Genitourinary: Negative.   Musculoskeletal: Positive for myalgias.  Neurological: Negative.   Psychiatric/Behavioral: Negative.     Per HPI unless specifically indicated above     Objective:    BP 121/80 (BP Location: Right Arm, Patient Position: Sitting, Cuff Size: Normal)   Pulse 73   Temp 97.7 F (36.5 C) (Temporal)   Wt 151 lb 11.2 oz (68.8 kg)   LMP  (LMP Unknown)   SpO2 94%   BMI 30.03 kg/m   Wt Readings from Last 3 Encounters:  01/02/17 154 lb 3.2 oz (69.9 kg)  12/30/16 151 lb 11.2 oz (68.8 kg)  12/22/16 151 lb (68.5 kg)    Physical Exam  Constitutional: She  is oriented to person, place, and time. She appears well-developed and well-nourished. No distress.  HENT:  Head: Atraumatic.  Eyes: Conjunctivae are normal. Pupils are equal, round, and reactive to light.  Neck: Normal range of motion. Neck supple.  Cardiovascular: Normal rate and normal heart sounds.  Pulmonary/Chest: Effort normal and breath sounds normal. No respiratory distress.  Musculoskeletal: Normal range of motion.  Neurological: She is alert and oriented to person, place, and time.  Skin: Skin is warm and dry.  Psychiatric: She has a normal mood and affect. Her behavior is normal.  Nursing note and vitals reviewed.  Results for orders placed or performed in visit on 11/22/16  Comprehensive metabolic panel  Result Value Ref Range   Glucose 92 65 - 99 mg/dL   BUN 16 6 - 24 mg/dL   Creatinine, Ser 0.80 0.57 - 1.00 mg/dL   GFR calc non Af Amer 83 >59 mL/min/1.73   GFR calc Af Amer 96 >59 mL/min/1.73   BUN/Creatinine Ratio 20 9 - 23   Sodium 141 134 - 144 mmol/L   Potassium 4.3 3.5 - 5.2 mmol/L   Chloride 103 96 - 106 mmol/L   CO2 20 20 - 29 mmol/L   Calcium 10.0 8.7 - 10.2 mg/dL   Total Protein 7.2 6.0 - 8.5 g/dL   Albumin  4.9 3.5 - 5.5 g/dL   Globulin, Total 2.3 1.5 - 4.5 g/dL   Albumin/Globulin Ratio 2.1 1.2 - 2.2   Bilirubin Total 0.4 0.0 - 1.2 mg/dL   Alkaline Phosphatase 71 39 - 117 IU/L   AST 40 0 - 40 IU/L   ALT 34 (H) 0 - 32 IU/L  Lipid Panel w/o Chol/HDL Ratio  Result Value Ref Range   Cholesterol, Total 196 100 - 199 mg/dL   Triglycerides 71 0 - 149 mg/dL   HDL 79 >39 mg/dL   VLDL Cholesterol Cal 14 5 - 40 mg/dL   LDL Calculated 103 (H) 0 - 99 mg/dL  TSH  Result Value Ref Range   TSH 0.663 0.450 - 4.500 uIU/mL  CBC with Differential/Platelet  Result Value Ref Range   WBC 8.7 3.4 - 10.8 x10E3/uL   RBC 4.34 3.77 - 5.28 x10E6/uL   Hemoglobin 12.7 11.1 - 15.9 g/dL   Hematocrit 40.1 34.0 - 46.6 %   MCV 92 79 - 97 fL   MCH 29.3 26.6 - 33.0 pg   MCHC  31.7 31.5 - 35.7 g/dL   RDW 13.9 12.3 - 15.4 %   Platelets 311 150 - 379 x10E3/uL   Neutrophils 64 Not Estab. %   Lymphs 28 Not Estab. %   Monocytes 6 Not Estab. %   Eos 1 Not Estab. %   Basos 1 Not Estab. %   Neutrophils Absolute 5.6 1.4 - 7.0 x10E3/uL   Lymphocytes Absolute 2.5 0.7 - 3.1 x10E3/uL   Monocytes Absolute 0.5 0.1 - 0.9 x10E3/uL   EOS (ABSOLUTE) 0.1 0.0 - 0.4 x10E3/uL   Basophils Absolute 0.0 0.0 - 0.2 x10E3/uL   Immature Granulocytes 0 Not Estab. %   Immature Grans (Abs) 0.0 0.0 - 0.1 x10E3/uL      Assessment & Plan:   Problem List Items Addressed This Visit    None    Visit Diagnoses    Left lower quadrant pain    -  Primary   Likely a bad muscle strain, heating pad, rest, flexeril, ibuprofen and tylenol prn. F/u if no improvement       Follow up plan: Return if symptoms worsen or fail to improve.

## 2017-01-02 ENCOUNTER — Encounter: Payer: Self-pay | Admitting: Family Medicine

## 2017-01-02 ENCOUNTER — Ambulatory Visit: Payer: BC Managed Care – PPO | Admitting: Family Medicine

## 2017-01-02 VITALS — BP 121/76 | HR 69 | Temp 98.5°F | Wt 154.2 lb

## 2017-01-02 DIAGNOSIS — R1032 Left lower quadrant pain: Secondary | ICD-10-CM | POA: Diagnosis not present

## 2017-01-02 MED ORDER — HYDROCODONE-ACETAMINOPHEN 5-325 MG PO TABS: 1.0000 | ORAL_TABLET | Freq: Four times a day (QID) | ORAL | 0 refills | Status: DC | PRN

## 2017-01-02 NOTE — Progress Notes (Signed)
BP 121/76   Pulse 69   Temp 98.5 F (36.9 C) (Oral)   Wt 154 lb 3.2 oz (69.9 kg)   LMP  (LMP Unknown)   SpO2 95%   BMI 30.52 kg/m    Subjective:    Patient ID: Deanna Velez, female    DOB: 03/16/1960, 56 y.o.   MRN: [id number]  HPI: Deanna Velez is a 56 y.o. female  Chief Complaint  Patient presents with  . URI    3 day f/up, patient states she is feeling worse    Patient presents following up on her left peri-umbilical abdominal pain. States the pain is worse than 3 days ago when she was last in, despite ibuprofen and flexeril. Pain is constant and stabbing superficially instead of just with coughing or sneezing like it was previously. Still denies fevers, chills, N/V/D, redness, bruising to the area. Tolerating PO well. Has not been using heating pads or any topical products.   Relevant past medical, surgical, family and social history reviewed and updated as indicated. Interim medical history since our last visit reviewed. Allergies and medications reviewed and updated.  Review of Systems  Constitutional: Negative.   Respiratory: Negative.   Cardiovascular: Negative.   Gastrointestinal: Positive for abdominal pain.  Genitourinary: Negative.   Musculoskeletal: Negative.   Neurological: Negative.   Psychiatric/Behavioral: Negative.     Per HPI unless specifically indicated above     Objective:    BP 121/76   Pulse 69   Temp 98.5 F (36.9 C) (Oral)   Wt 154 lb 3.2 oz (69.9 kg)   LMP  (LMP Unknown)   SpO2 95%   BMI 30.52 kg/m   Wt Readings from Last 3 Encounters:  01/03/17 154 lb (69.9 kg)  01/02/17 154 lb 3.2 oz (69.9 kg)  12/30/16 151 lb 11.2 oz (68.8 kg)    Physical Exam  Constitutional: She is oriented to person, place, and time. She appears well-developed and well-nourished. No distress.  HENT:  Head: Atraumatic.  Eyes: Conjunctivae are normal. Pupils are equal, round, and reactive to light. No scleral icterus.  Neck: Normal range of motion. Neck  supple.  Cardiovascular: Normal rate and normal heart sounds.  Pulmonary/Chest: Effort normal and breath sounds normal. No respiratory distress.  Abdominal: Soft. Bowel sounds are normal. She exhibits no distension and no mass. There is tenderness (Significant ttp left umbilicus extending lateraly about 2 inches).  No hernia defects noted on palpation, no skin changes in area of pain  Neurological: She is alert and oriented to person, place, and time.  Skin: Skin is warm and dry.  Psychiatric: She has a normal mood and affect. Her behavior is normal.  Nursing note and vitals reviewed.  Results for orders placed or performed in visit on 11/22/16  Comprehensive metabolic panel  Result Value Ref Range   Glucose 92 65 - 99 mg/dL   BUN 16 6 - 24 mg/dL   Creatinine, Ser 0.80 0.57 - 1.00 mg/dL   GFR calc non Af Amer 83 >59 mL/min/1.73   GFR calc Af Amer 96 >59 mL/min/1.73   BUN/Creatinine Ratio 20 9 - 23   Sodium 141 134 - 144 mmol/L   Potassium 4.3 3.5 - 5.2 mmol/L   Chloride 103 96 - 106 mmol/L   CO2 20 20 - 29 mmol/L   Calcium 10.0 8.7 - 10.2 mg/dL   Total Protein 7.2 6.0 - 8.5 g/dL   Albumin 4.9 3.5 - 5.5 g/dL   Globulin, Total 2.3 1.5 -  4.5 g/dL   Albumin/Globulin Ratio 2.1 1.2 - 2.2   Bilirubin Total 0.4 0.0 - 1.2 mg/dL   Alkaline Phosphatase 71 39 - 117 IU/L   AST 40 0 - 40 IU/L   ALT 34 (H) 0 - 32 IU/L  Lipid Panel w/o Chol/HDL Ratio  Result Value Ref Range   Cholesterol, Total 196 100 - 199 mg/dL   Triglycerides 71 0 - 149 mg/dL   HDL 79 >39 mg/dL   VLDL Cholesterol Cal 14 5 - 40 mg/dL   LDL Calculated 103 (H) 0 - 99 mg/dL  TSH  Result Value Ref Range   TSH 0.663 0.450 - 4.500 uIU/mL  CBC with Differential/Platelet  Result Value Ref Range   WBC 8.7 3.4 - 10.8 x10E3/uL   RBC 4.34 3.77 - 5.28 x10E6/uL   Hemoglobin 12.7 11.1 - 15.9 g/dL   Hematocrit 40.1 34.0 - 46.6 %   MCV 92 79 - 97 fL   MCH 29.3 26.6 - 33.0 pg   MCHC 31.7 31.5 - 35.7 g/dL   RDW 13.9 12.3 - 15.4 %     Platelets 311 150 - 379 x10E3/uL   Neutrophils 64 Not Estab. %   Lymphs 28 Not Estab. %   Monocytes 6 Not Estab. %   Eos 1 Not Estab. %   Basos 1 Not Estab. %   Neutrophils Absolute 5.6 1.4 - 7.0 x10E3/uL   Lymphocytes Absolute 2.5 0.7 - 3.1 x10E3/uL   Monocytes Absolute 0.5 0.1 - 0.9 x10E3/uL   EOS (ABSOLUTE) 0.1 0.0 - 0.4 x10E3/uL   Basophils Absolute 0.0 0.0 - 0.2 x10E3/uL   Immature Granulocytes 0 Not Estab. %   Immature Grans (Abs) 0.0 0.0 - 0.1 x10E3/uL      Assessment & Plan:   Problem List Items Addressed This Visit    None    Visit Diagnoses    Abdominal pain, acute, left lower quadrant    -  Primary   Relevant Orders   US Abdomen Complete    Discussed CT vs u/s to evaluate area for abdominal wall defects vs muscle strain/tear, pt opting for u/s at this time. Will order abdominal u/s and treat pain with prn norco, ibuprofen, flexeril, heating pads, lidocaine patches. Precautions reviewed with sedation risks and some of these medications. Return precautions given for ER including redness in area of pain, fevers, intractable vomiting. Pt understanding and agreeable to plan.   Follow up plan: Return if symptoms worsen or fail to improve.

## 2017-01-02 NOTE — Patient Instructions (Signed)
Follow up if no improvement 

## 2017-01-03 ENCOUNTER — Encounter: Payer: Self-pay | Admitting: *Deleted

## 2017-01-03 ENCOUNTER — Telehealth: Payer: Self-pay | Admitting: Internal Medicine

## 2017-01-03 ENCOUNTER — Emergency Department: Payer: BC Managed Care – PPO

## 2017-01-03 ENCOUNTER — Other Ambulatory Visit: Payer: Self-pay

## 2017-01-03 ENCOUNTER — Telehealth: Payer: Self-pay | Admitting: Family Medicine

## 2017-01-03 ENCOUNTER — Emergency Department
Admission: EM | Admit: 2017-01-03 | Discharge: 2017-01-03 | Disposition: A | Discharge status: Home / Self Care | Payer: BC Managed Care – PPO | Attending: Emergency Medicine | Admitting: Emergency Medicine

## 2017-01-03 DIAGNOSIS — Z87891 Personal history of nicotine dependence: Secondary | ICD-10-CM | POA: Insufficient documentation

## 2017-01-03 DIAGNOSIS — R059 Cough, unspecified: Secondary | ICD-10-CM

## 2017-01-03 DIAGNOSIS — I10 Essential (primary) hypertension: Secondary | ICD-10-CM | POA: Diagnosis not present

## 2017-01-03 DIAGNOSIS — J9811 Atelectasis: Secondary | ICD-10-CM | POA: Insufficient documentation

## 2017-01-03 DIAGNOSIS — R05 Cough: Secondary | ICD-10-CM | POA: Insufficient documentation

## 2017-01-03 DIAGNOSIS — R1012 Left upper quadrant pain: Secondary | ICD-10-CM | POA: Diagnosis present

## 2017-01-03 HISTORY — DX: Essential (primary) hypertension: I10

## 2017-01-03 LAB — COMPREHENSIVE METABOLIC PANEL
ALBUMIN: 4.3 g/dL (ref 3.5–5.0)
ALK PHOS: 60 U/L (ref 38–126)
ALT: 33 U/L (ref 14–54)
AST: 35 U/L (ref 15–41)
Anion gap: 10 (ref 5–15)
BILIRUBIN TOTAL: 0.7 mg/dL (ref 0.3–1.2)
BUN: 14 mg/dL (ref 6–20)
CALCIUM: 9.5 mg/dL (ref 8.9–10.3)
CO2: 23 mmol/L (ref 22–32)
Chloride: 106 mmol/L (ref 101–111)
Creatinine, Ser: 0.64 mg/dL (ref 0.44–1.00)
GFR calc Af Amer: >60 mL/min (ref >60)
GFR calc non Af Amer: >60 mL/min (ref >60)
GLUCOSE: 100 mg/dL — AB (ref 65–99)
POTASSIUM: 4.4 mmol/L (ref 3.5–5.1)
Sodium: 139 mmol/L (ref 135–145)
TOTAL PROTEIN: 7.1 g/dL (ref 6.5–8.1)

## 2017-01-03 LAB — CBC
HEMATOCRIT: 37.7 % (ref 35.0–47.0)
Hemoglobin: 12.3 g/dL (ref 12.0–16.0)
MCH: 29.8 pg (ref 26.0–34.0)
MCHC: 32.7 g/dL (ref 32.0–36.0)
MCV: 91.2 fL (ref 80.0–100.0)
Platelets: 259 10*3/uL (ref 150–440)
RBC: 4.13 MIL/uL (ref 3.80–5.20)
RDW: 13.3 % (ref 11.5–14.5)
WBC: 8.4 10*3/uL (ref 3.6–11.0)

## 2017-01-03 LAB — LIPASE, BLOOD: Lipase: 25 U/L (ref 11–51)

## 2017-01-03 MED ORDER — IBUPROFEN 800 MG PO TABS: 800.0000 mg | ORAL_TABLET | Freq: Three times a day (TID) | ORAL | 0 refills | Status: DC | PRN

## 2017-01-03 MED ORDER — IOPAMIDOL (ISOVUE-300) INJECTION 61%
100.0000 mL | Freq: Once | INTRAVENOUS | Status: AC | PRN
  Administered 2017-01-03: 100 mL via INTRAVENOUS
  Filled 2017-01-03: qty 100

## 2017-01-03 MED ORDER — OXYCODONE-ACETAMINOPHEN 5-325 MG PO TABS
1.0000 | ORAL_TABLET | Freq: Once | ORAL | Status: AC
  Administered 2017-01-03: 1 via ORAL

## 2017-01-03 MED ORDER — SODIUM CHLORIDE 0.9 % IV BOLUS (SEPSIS)
1000.0000 mL | Freq: Once | INTRAVENOUS | Status: AC
  Administered 2017-01-03: 1000 mL via INTRAVENOUS

## 2017-01-03 MED ORDER — OXYCODONE-ACETAMINOPHEN 5-325 MG PO TABS: 1.0000 | ORAL_TABLET | Freq: Four times a day (QID) | ORAL | 0 refills | Status: DC | PRN

## 2017-01-03 MED ORDER — IOPAMIDOL (ISOVUE-300) INJECTION 61%
30.0000 mL | Freq: Once | INTRAVENOUS | Status: AC | PRN
  Administered 2017-01-03: 30 mL via ORAL
  Filled 2017-01-03: qty 30

## 2017-01-03 MED ORDER — KETOROLAC TROMETHAMINE 30 MG/ML IJ SOLN
30.0000 mg | Freq: Once | INTRAMUSCULAR | Status: AC
  Administered 2017-01-03: 30 mg via INTRAVENOUS
  Filled 2017-01-03: qty 1

## 2017-01-03 MED ORDER — OXYCODONE-ACETAMINOPHEN 5-325 MG PO TABS
ORAL_TABLET | ORAL | Status: AC
  Filled 2017-01-03: qty 1

## 2017-01-03 NOTE — Discharge Instructions (Signed)
It is likely that your abdominal pain is due to muscle strain from your coughing.  You may take Tylenol or Motrin for mild to moderate pain, and Percocet for severe pain.  Do not drive within 8 hours of taking Percocet.  Return to the emergency department if you develop severe pain, lightheadedness or fainting, shortness of breath, fever, inability to keep down fluids, or any other symptoms concerning to you.

## 2017-01-03 NOTE — ED Provider Notes (Signed)
[facility] Emergency Department Provider Note  ____________________________________________  Time seen: Approximately 6:44 PM  I have reviewed the triage vital signs and the nursing notes.   HISTORY  Chief Complaint Abdominal Pain    HPI Deanna Velez is a 56 y.o. female with a history of hypertension, cough for multiple weeks, presenting with left upper quadrant pain.  The patient reports that for the last several weeks, she has had a nonproductive dry cough associated with some minimal congestion but no sore throat, ear pain, fevers or chills.  She has no associated shortness of breath, lower extremity swelling or chest pain.  2 weeks ago, during a particularly violent coughing spasm, the patient had a "ripping" sensation in the left upper quadrant abdominal wall, and since then, she has had significant pain with every time she coughs.  She also has pain if she presses on the area, but she has no pain at rest.  She was evaluated by her PMD, who ordered an outpatient ultrasound to evaluate for abdominal wall tear, but her pain has become so significant that she is unable to wait.  Past Medical History:  Diagnosis Date  . Gallbladder attack   . Hypertension   . Lack of libido   . PMB (postmenopausal bleeding)   . Ringworm   . Vitamin D deficiency disease     Patient Active Problem List   Diagnosis Date Noted  . Allergic rhinitis 08/18/2016  . Plantar fasciitis, bilateral 02/27/2015  . Hypertension 02/27/2015  . Pelvic pain in female 01/21/2015    Past Surgical History:  Procedure Laterality Date  . TOE SURGERY Right 05/2014  . TONSILLECTOMY    . TUBAL LIGATION      Current Outpatient Rx  . Order #: [id number] Class: Normal  . Order #: [id number] Class: Normal  . Order #: [id number] Class: Normal  . Order #: [id number] Class: Historical Med  . Order #: [id number] Class: Print  . Order #: [id number] Class: Print  . Order #: [id number] Class: Normal  . Order #:  [id number] Class: Historical Med  . Order #: [id number] Class: Print  . Order #: [id number] Class: Historical Med    Allergies Other; Beef-derived products; and Pork-derived products  Family History  Problem Relation Age of Onset  . Hyperlipidemia Mother   . Diabetes Mother   . Heart disease Mother   . Cancer Sister        leukemia  . Diabetes Sister   . Diabetes Maternal Grandmother   . Hyperlipidemia Sister   . Diabetes Sister   . Stroke Sister   . Breast cancer Neg Hx     Social History Social History   Tobacco Use  . Smoking status: Former Smoker    Packs/day: 0.25    Types: Cigarettes    Last attempt to quit: 12/22/2016    Years since quitting: 0.0  . Smokeless tobacco: Never Used  Substance Use Topics  . Alcohol use: Yes    Alcohol/week: 0.0 oz    Comment: beer on the weekends  . Drug use: No    Review of Systems Constitutional: No fever/chills.  No lightheadedness or syncope. Eyes: No visual changes. ENT: No sore throat.  Positive congestion and rhinorrhea.  No ear pain. Cardiovascular: Denies chest pain. Denies palpitations. Respiratory: Denies shortness of breath.  Positive cough. Gastrointestinal: Positive abdominal wall pain with coughing.   No nausea, no vomiting.  No diarrhea.  No constipation. Genitourinary: Negative for dysuria. Musculoskeletal: Negative for back pain. Skin: Negative for rash. Neurological: Negative for headaches. No  focal numbness, tingling or weakness.     ____________________________________________   PHYSICAL EXAM:  VITAL SIGNS: ED Triage Vitals  Enc Vitals Group     BP 01/03/17 1411 (!) 131/91     Pulse Rate 01/03/17 1411 85     Resp 01/03/17 1411 16     Temp 01/03/17 1411 98.5 F (36.9 C)     Temp Source 01/03/17 1411 Oral     SpO2 01/03/17 1411 96 %     Weight 01/03/17 1412 154 lb (69.9 kg)     Height 01/03/17 1412 5' (1.524 m)     Head Circumference --      Peak Flow --      Pain Score 01/03/17 1411 10     Pain  Loc --      Pain Edu? --      Excl. in [place]? --     Constitutional: Alert and oriented. Well appearing and in no acute distress. Answers questions appropriately. Eyes: Conjunctivae are normal.  EOMI. No scleral icterus. Head: Atraumatic. Nose: No congestion/rhinnorhea. Mouth/Throat: Mucous membranes are moist.  Neck: No stridor.  Supple.  No JVD.  No trismus. Cardiovascular: Normal rate, regular rhythm. No murmurs, rubs or gallops.  Respiratory: Normal respiratory effort.  No accessory muscle use or retractions. Lungs CTAB.  No wheezes, rales or ronchi. Gastrointestinal: Soft, and nondistended.  The patient has a focal area which is approximately 2 x 2 cm in the left upper quadrant where she has tenderness.  There is no underlying mass or palpable hernia.  No guarding or rebound.  No peritoneal signs. Musculoskeletal: No LE edema. No ttp in the calves or palpable cords.  Negative Homan's sign. Neurologic:  A&Ox3.  Speech is clear.  Face and smile are symmetric.  EOMI.  Moves all extremities well. Skin:  Skin is warm, dry and intact. No rash noted. Psychiatric: Mood and affect are normal. Speech and behavior are normal.  Normal judgement.  ____________________________________________   LABS (all labs ordered are listed, but only abnormal results are displayed)  Labs Reviewed  COMPREHENSIVE METABOLIC PANEL - Abnormal; Notable for the following components:      Result Value   Glucose, Bld 100 (*)    All other components within normal limits  LIPASE, BLOOD  CBC  URINALYSIS, COMPLETE (UACMP) WITH MICROSCOPIC   ____________________________________________  EKG  Not indicated ____________________________________________  RADIOLOGY  Dg Chest 2 View  Result Date: 01/03/2017 CLINICAL DATA:  Cough EXAM: CHEST  2 VIEW COMPARISON:  None. FINDINGS: Shallow lung inflation without focal consolidation. There is linear atelectasis at the right lung base. No pulmonary edema. No pleural  effusion or pneumothorax. Normal cardiomediastinal contours. IMPRESSION: Shallow lung inflation and right basilar atelectasis. Electronically Signed   By: Ulyses Jarred M.D.   On: 01/03/2017 17:43   Ct Abdomen Pelvis W Contrast  Result Date: 01/03/2017 CLINICAL DATA:  Left lower quadrant abdomen pain EXAM: CT ABDOMEN AND PELVIS WITH CONTRAST TECHNIQUE: Multidetector CT imaging of the abdomen and pelvis was performed using the standard protocol following bolus administration of intravenous contrast. CONTRAST:  184mL ISOVUE-300 IOPAMIDOL (ISOVUE-300) INJECTION 61% COMPARISON:  January 27 27 FINDINGS: Lower chest: There is minimal atelectasis of the lung bases. The heart size is normal. Hepatobiliary: No focal liver abnormality is seen. Diffuse low density of the liver is noted. No gallstones, gallbladder wall thickening, or biliary dilatation. Pancreas: Unremarkable. No pancreatic ductal dilatation or surrounding inflammatory changes. Spleen: Normal in size without focal abnormality. Adrenals/Urinary Tract: The adrenal  glands are normal. There are cysts within the bilateral kidneys, largest measures 1.3 cm in the anterior midpole left kidney. There is no hydronephrosis bilaterally. The bladder is normal. Stomach/Bowel: Stomach is within normal limits. Appendix appears normal. No evidence of bowel wall thickening, distention, or inflammatory changes. There is diverticulosis of colon. Vascular/Lymphatic: No significant vascular findings are present. No enlarged abdominal or pelvic lymph nodes. Reproductive: Uterine fibroids are noted unchanged. The adnexa is unremarkable. Other: None. Musculoskeletal: Mild degenerative joint changes of the spine are noted. IMPRESSION: No acute abnormality identified in the abdomen and pelvis. Diverticulosis without acute diverticulitis. Small renal cysts. Uterine fibroids. Electronically Signed   By: Abelardo Diesel M.D.   On: 01/03/2017 17:33     ____________________________________________   PROCEDURES  Procedure(s) performed: None  Procedures  Critical Care performed: No ____________________________________________   INITIAL IMPRESSION / ASSESSMENT AND PLAN / ED COURSE  Pertinent labs & imaging results that were available during my care of the patient were reviewed by me and considered in my medical decision making (see chart for details).  56 y.o. female with a history of several weeks of cough, with a tearing sensation 2 weeks ago during a coughing spasm with ongoing pain.  Overall, the patient is hemodynamically stable and afebrile.  She has no associated red flag symptoms.  The most likely etiology of the patient's pain is rectal sheath tear, rectus tear, less likely abdominal wall hematoma or bleeding, or hernia.  An intra-abdominal pathology is much less likely.  We will plan to get a CT for further evaluation, and initiate symptomatic treatment.  ----------------------------------------- 6:48 PM on 01/03/2017 -----------------------------------------  The patient's workup is reassuring with a normal white blood cell count, normal electrolytes, normal lipase.  Her CT scan does not show any acute process.  At this time, the patient will be discharged home with instructions for symptomatic treatment and PMD follow-up.  She understands return precautions as well as follow-up instructions.  ____________________________________________  FINAL CLINICAL IMPRESSION(S) / ED DIAGNOSES  Final diagnoses:  Atelectasis  Cough  Left upper quadrant pain         NEW MEDICATIONS STARTED DURING THIS VISIT:  This SmartLink is deprecated. Use AVSMEDLIST instead to display the medication list for a patient.    Eula Listen, MD 01/03/17 [phone]

## 2017-01-03 NOTE — ED Notes (Signed)
Pt has left side abd pain.  Sx since last week.  Pt also reports a cough and is taking meds.  No n/v/d. No back pain, no urinary sx.   No fever.  Pt alert.

## 2017-01-03 NOTE — ED Triage Notes (Signed)
Pt to ED reporting having LLQ sharp stabbing abd  Pain when coughing. Pt reports last week she had a cough and during one episode of coughing she felt a sudden stabbing pain in the same LLQ region. PT has an ultrasound scheduled to check for a hernia or pulled muscle but reports she can not wait until PCP schedule exam. No NVD and no fevers reports.

## 2017-01-03 NOTE — Telephone Encounter (Signed)
Has ultrasound scheduled for Friday, that was the earliest available date. If severely worsening, redness in area, fever, etc she has been instructed to go to ER.

## 2017-01-03 NOTE — Telephone Encounter (Signed)
Pt called complaining of worsening abdominal symptoms; she was seen by Merrie Roof, PA yesterday; Pt states "that there is a bulge" when she coughs that she can not feel with her hand but she "can feel something moving near her belly button"; pt also states that it is painful when she coughs, and also says "it feels like pushes out"; She feels this inside her body; Pt requests to come to MDs office and to have ultrasound date moved up because "something is wrong"; will route to [facility]; pt can be reached at [phone]; will let pt know of MDs recommendations

## 2017-01-03 NOTE — Telephone Encounter (Signed)
Please see phone encounter in chart review. Patient called back after this encounter and was instructed to go to ER per Mina note. Patient states that she was going to go according to phone encounter. Patient arrived at ED according to chart review.

## 2017-01-03 NOTE — Telephone Encounter (Signed)
Pt called back complaining of worsening pain; Pt is notified per Merrie Roof, PA's note that Friday is the earliest date that [place] can be done and if symptoms are worsening, pt should go to Emergency Department; Pt states that she is able to drive herself to [facility] and will do so; will route to [facility] to make them aware of pt's disposition

## 2017-01-04 NOTE — Patient Instructions (Signed)
Follow up as needed

## 2017-01-04 NOTE — Telephone Encounter (Signed)
Noted, thanks!

## 2017-01-06 ENCOUNTER — Ambulatory Visit: Payer: BC Managed Care – PPO

## 2017-01-30 ENCOUNTER — Other Ambulatory Visit: Payer: Self-pay

## 2017-01-30 ENCOUNTER — Telehealth: Payer: Self-pay

## 2017-01-30 DIAGNOSIS — Z1211 Encounter for screening for malignant neoplasm of colon: Secondary | ICD-10-CM

## 2017-01-30 NOTE — Telephone Encounter (Signed)
Gastroenterology Pre-Procedure Review  Request Date: 02/24/17 Requesting Physician: Dr. Vicente Males  PATIENT REVIEW QUESTIONS: The patient responded to the following health history questions as indicated:    1. Are you having any GI issues? yes (loose stool, abdominal pain) 2. Do you have a personal history of Polyps? no 3. Do you have a family history of Colon Cancer or Polyps? no 4. Diabetes Mellitus? no 5. Joint replacements in the past 12 months?no 6. Major health problems in the past 3 months?no 7. Any artificial heart valves, MVP, or defibrillator?no    MEDICATIONS & ALLERGIES:    Patient reports the following regarding taking any anticoagulation/antiplatelet therapy:   Plavix, Coumadin, Eliquis, Xarelto, Lovenox, Pradaxa, Brilinta, or Effient? no Aspirin? no  Patient confirms/reports the following medications:  Current Outpatient Medications  Medication Sig Dispense Refill  . benzonatate (TESSALON) 200 MG capsule Take 1 capsule (200 mg total) by mouth 2 (two) times daily as needed for cough. 60 capsule 0  . budesonide-formoterol (SYMBICORT) 160-4.5 MCG/ACT inhaler Inhale 2 puffs 2 (two) times daily into the lungs. 1 Inhaler 3  . cyclobenzaprine (FLEXERIL) 10 MG tablet Take 1 tablet (10 mg total) 3 (three) times daily as needed by mouth for muscle spasms. 90 tablet 0  . EPINEPHrine (EPIPEN IJ) Inject as directed.    Marland Kitchen HYDROcodone-acetaminophen (NORCO/VICODIN) 5-325 MG tablet Take 1 tablet every 6 (six) hours as needed by mouth for moderate pain. 20 tablet 0  . ibuprofen (ADVIL,MOTRIN) 800 MG tablet Take 1 tablet (800 mg total) every 8 (eight) hours as needed by mouth. 20 tablet 0  . losartan (COZAAR) 100 MG tablet Take 1 tablet (100 mg total) by mouth daily. 90 tablet 0  . Multiple Vitamins-Minerals (MULTIVITAMIN ADULT PO) Take by mouth daily.    Marland Kitchen oxyCODONE-acetaminophen (ROXICET) 5-325 MG tablet Take 1 tablet every 6 (six) hours as needed by mouth. 6 tablet 0  . POTASSIUM PO Take 99  mg daily by mouth.      No current facility-administered medications for this visit.     Patient confirms/reports the following allergies:  Allergies  Allergen Reactions  . 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 Beef-Derived Products   . Pork-Derived Products     No orders of the defined types were placed in this encounter.   AUTHORIZATION INFORMATION Primary Insurance: 1D#: Group #:  Secondary Insurance: 1D#: Group #:  SCHEDULE INFORMATION: Date: 02/24/17 Time: Location:[facility]

## 2017-02-15 ENCOUNTER — Telehealth: Payer: Self-pay | Admitting: Gastroenterology

## 2017-02-15 NOTE — Telephone Encounter (Signed)
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 She will call back later to reschedule.

## 2017-02-15 NOTE — Telephone Encounter (Signed)
Colonoscopy and EGD has been canceled 02/24/17 pt has to work.

## 2017-02-23 ENCOUNTER — Ambulatory Visit: Payer: BC Managed Care – PPO | Admitting: Family Medicine

## 2017-02-24 ENCOUNTER — Ambulatory Visit: Admit: 2017-02-24 | Payer: BC Managed Care – PPO | Admitting: Gastroenterology

## 2017-02-24 ENCOUNTER — Telehealth: Payer: Self-pay | Admitting: Unknown Physician Specialty

## 2017-02-24 SURGERY — COLONOSCOPY WITH PROPOFOL
Anesthesia: General

## 2017-02-24 MED ORDER — LOSARTAN POTASSIUM 100 MG PO TABS: 100.0000 mg | ORAL_TABLET | Freq: Every day | ORAL | 0 refills | Status: DC

## 2017-02-24 NOTE — Telephone Encounter (Signed)
Needs f/u appointment.

## 2017-02-24 NOTE — Telephone Encounter (Signed)
30 day supply sent in. Please schedule patient a f/up within that time.

## 2017-02-24 NOTE — Telephone Encounter (Signed)
Patient needs a refill on losartan potassium   Send to [facility]

## 2017-02-27 NOTE — Telephone Encounter (Signed)
Patient called and said [facility] does not have her script. Please re-send

## 2017-02-27 NOTE — Telephone Encounter (Signed)
Patient requested her refill of losartan to be sent to [place], [place] called, spoke with Loma Sousa, informed her the patient wants the losartan filled there and the prescription was sent to [facility] at [phone], Loma Sousa said "we will call and if they are closed, we will call tomorrow."

## 2017-02-27 NOTE — Telephone Encounter (Signed)
Advised her it was sent to [facility] & she wants it sent to [place], [place]

## 2017-03-01 ENCOUNTER — Encounter: Payer: Self-pay | Admitting: Family Medicine

## 2017-03-01 ENCOUNTER — Ambulatory Visit: Payer: BC Managed Care – PPO | Admitting: Family Medicine

## 2017-03-01 VITALS — BP 127/82 | HR 96 | Temp 98.0°F | Wt 152.3 lb

## 2017-03-01 DIAGNOSIS — I10 Essential (primary) hypertension: Secondary | ICD-10-CM | POA: Diagnosis not present

## 2017-03-01 MED ORDER — LOSARTAN POTASSIUM 100 MG PO TABS: 100.0000 mg | ORAL_TABLET | Freq: Every day | ORAL | 1 refills | Status: DC

## 2017-03-01 NOTE — Progress Notes (Signed)
BP 127/82 (BP Location: Right Arm, Patient Position: Sitting, Cuff Size: Normal)   Pulse 96   Temp 98 F (36.7 C) (Oral)   Wt 152 lb 4.8 oz (69.1 kg)   LMP  (LMP Unknown)   SpO2 97%   BMI 29.74 kg/m    Subjective:    Patient ID: Deanna Velez, female    DOB: Nov 10, 1960, 57 y.o.   MRN: [id number]  HPI: Deanna Velez is a 57 y.o. female  Chief Complaint  Patient presents with  . Hypertension    Patient states no complaints  . Blood Pressure Check  . Medication Refill   Pt here today for BP check. Home BPs running 120s/80s when taken. Denies side effects, taking medication faitihfully. No CP, SOB, HAs, dizziness.   Relevant past medical, surgical, family and social history reviewed and updated as indicated. Interim medical history since our last visit reviewed. Allergies and medications reviewed and updated.  Review of Systems  Constitutional: Negative.   Respiratory: Negative.   Cardiovascular: Negative.   Gastrointestinal: Negative.   Musculoskeletal: Negative.   Neurological: Negative.   Psychiatric/Behavioral: Negative.     Per HPI unless specifically indicated above     Objective:    BP 127/82 (BP Location: Right Arm, Patient Position: Sitting, Cuff Size: Normal)   Pulse 96   Temp 98 F (36.7 C) (Oral)   Wt 152 lb 4.8 oz (69.1 kg)   LMP  (LMP Unknown)   SpO2 97%   BMI 29.74 kg/m   Wt Readings from Last 3 Encounters:  03/01/17 152 lb 4.8 oz (69.1 kg)  01/03/17 154 lb (69.9 kg)  01/02/17 154 lb 3.2 oz (69.9 kg)    Physical Exam  Constitutional: She is oriented to person, place, and time. She appears well-developed and well-nourished. No distress.  HENT:  Head: Atraumatic.  Eyes: Conjunctivae are normal. Pupils are equal, round, and reactive to light. No scleral icterus.  Neck: Normal range of motion. Neck supple.  Cardiovascular: Normal rate and normal heart sounds.  Pulmonary/Chest: Effort normal and breath sounds normal.  Musculoskeletal: Normal range  of motion.  Neurological: She is alert and oriented to person, place, and time.  Skin: Skin is warm and dry.  Psychiatric: She has a normal mood and affect. Her behavior is normal.  Nursing note and vitals reviewed.  Results for orders placed or performed in visit on 40/81/44  Basic Metabolic Panel (BMET)  Result Value Ref Range   Glucose 148 (H) 65 - 99 mg/dL   BUN 20 6 - 24 mg/dL   Creatinine, Ser 0.78 0.57 - 1.00 mg/dL   GFR calc non Af Amer 86 >59 mL/min/1.73   GFR calc Af Amer 99 >59 mL/min/1.73   BUN/Creatinine Ratio 26 (H) 9 - 23   Sodium 143 134 - 144 mmol/L   Potassium 3.9 3.5 - 5.2 mmol/L   Chloride 107 (H) 96 - 106 mmol/L   CO2 24 20 - 29 mmol/L   Calcium 10.0 8.7 - 10.2 mg/dL      Assessment & Plan:   Problem List Items Addressed This Visit      Cardiovascular and Mediastinum   Hypertension - Primary    Stable on losartan 100 mg. Continue current regimen. Await BMP results      Relevant Medications   losartan (COZAAR) 100 MG tablet   Other Relevant Orders   Basic Metabolic Panel (BMET) (Completed)       Follow up plan: Return in about 10 months (around  12/30/2017) for CPE, BP f/u.

## 2017-03-02 LAB — BASIC METABOLIC PANEL
BUN/Creatinine Ratio: 26 — ABNORMAL HIGH (ref 9–23)
BUN: 20 mg/dL (ref 6–24)
CALCIUM: 10 mg/dL (ref 8.7–10.2)
CO2: 24 mmol/L (ref 20–29)
Chloride: 107 mmol/L — ABNORMAL HIGH (ref 96–106)
Creatinine, Ser: 0.78 mg/dL (ref 0.57–1.00)
GFR, EST AFRICAN AMERICAN: 99 mL/min/{1.73_m2} (ref >59)
GFR, EST NON AFRICAN AMERICAN: 86 mL/min/{1.73_m2} (ref >59)
Glucose: 148 mg/dL — ABNORMAL HIGH (ref 65–99)
Potassium: 3.9 mmol/L (ref 3.5–5.2)
Sodium: 143 mmol/L (ref 134–144)

## 2017-03-03 ENCOUNTER — Encounter: Payer: Self-pay | Admitting: Family Medicine

## 2017-03-04 NOTE — Assessment & Plan Note (Signed)
Stable on losartan 100 mg. Continue current regimen. Await BMP results

## 2017-03-04 NOTE — Patient Instructions (Signed)
Follow up for CPE 

## 2017-05-03 ENCOUNTER — Encounter: Payer: Self-pay | Admitting: Family Medicine

## 2017-05-03 ENCOUNTER — Telehealth: Payer: Self-pay | Admitting: Family Medicine

## 2017-05-03 ENCOUNTER — Ambulatory Visit: Payer: BC Managed Care – PPO | Admitting: Family Medicine

## 2017-05-03 VITALS — BP 134/86 | HR 99 | Temp 99.6°F | Wt 153.3 lb

## 2017-05-03 DIAGNOSIS — J101 Influenza due to other identified influenza virus with other respiratory manifestations: Secondary | ICD-10-CM

## 2017-05-03 LAB — VERITOR FLU A/B WAIVED
INFLUENZA B: NEGATIVE
Influenza A: POSITIVE — AB

## 2017-05-03 MED ORDER — OSELTAMIVIR PHOSPHATE 75 MG PO CAPS: 75.0000 mg | ORAL_CAPSULE | Freq: Two times a day (BID) | ORAL | 0 refills | Status: DC

## 2017-05-03 MED ORDER — BENZONATATE 200 MG PO CAPS: 200.0000 mg | ORAL_CAPSULE | Freq: Two times a day (BID) | ORAL | 0 refills | Status: DC | PRN

## 2017-05-03 MED ORDER — BALOXAVIR MARBOXIL(40 MG DOSE) 2 X 20 MG PO TBPK: 40.0000 mg | ORAL_TABLET | Freq: Once | ORAL | 0 refills | Status: AC

## 2017-05-03 MED ORDER — HYDROCOD POLST-CPM POLST ER 10-8 MG/5ML PO SUER: 5.0000 mL | Freq: Two times a day (BID) | ORAL | 0 refills | Status: DC | PRN

## 2017-05-03 MED ORDER — IBUPROFEN 800 MG PO TABS: 800.0000 mg | ORAL_TABLET | Freq: Three times a day (TID) | ORAL | 0 refills | Status: DC | PRN

## 2017-05-03 NOTE — Telephone Encounter (Signed)
Patient notified

## 2017-05-03 NOTE — Progress Notes (Signed)
   BP 134/86 (BP Location: Left Arm, Patient Position: Sitting, Cuff Size: Normal)   Pulse 99   Temp 99.6 F (37.6 C) (Tympanic)   Wt 153 lb 4.8 oz (69.5 kg)   LMP  (LMP Unknown)   SpO2 96%   BMI 29.94 kg/m    Subjective:    Patient ID: Deanna Velez, female    DOB: 02-Aug-1960, 57 y.o.   MRN: [id number]  HPI: Deanna Velez is a 57 y.o. female  Chief Complaint  Patient presents with  . Generalized Body Aches    Started Monday. Has progressively became worse.  . Nasal Congestion  . Headache  . Fever   102-103 fevers x 2 days, headaches, congestion, fatigue, generalized body aches, hacking cough. Denies CP, SOB, N/V/D. Taking ibuprofen and zycam with minimal relief. UTD on flu vaccine. Lots of sick contacts.   Relevant past medical, surgical, family and social history reviewed and updated as indicated. Interim medical history since our last visit reviewed. Allergies and medications reviewed and updated.  Review of Systems  Per HPI unless specifically indicated above     Objective:    BP 134/86 (BP Location: Left Arm, Patient Position: Sitting, Cuff Size: Normal)   Pulse 99   Temp 99.6 F (37.6 C) (Tympanic)   Wt 153 lb 4.8 oz (69.5 kg)   LMP  (LMP Unknown)   SpO2 96%   BMI 29.94 kg/m   Wt Readings from Last 3 Encounters:  05/03/17 153 lb 4.8 oz (69.5 kg)  03/01/17 152 lb 4.8 oz (69.1 kg)  01/03/17 154 lb (69.9 kg)    Physical Exam  Constitutional: She is oriented to person, place, and time. She appears well-developed. She appears distressed (appears ill).  HENT:  Head: Atraumatic.  Right Ear: External ear normal.  Left Ear: External ear normal.  Nasal mucosa and oropharynx erythematous Drainage present  Eyes: Conjunctivae are normal. Pupils are equal, round, and reactive to light.  Neck: Normal range of motion. Neck supple.  Cardiovascular: Normal rate and normal heart sounds.  Pulmonary/Chest: Effort normal and breath sounds normal. No respiratory distress.    Musculoskeletal: Normal range of motion.  Neurological: She is alert and oriented to person, place, and time.  Skin: Skin is warm and dry.  Psychiatric: She has a normal mood and affect. Her behavior is normal.  Nursing note and vitals reviewed.  Results for orders placed or performed in visit on 05/03/17  Veritor Flu A/B Waived  Result Value Ref Range   Influenza A Positive (A) Negative   Influenza B Negative Negative      Assessment & Plan:   Problem List Items Addressed This Visit    None    Visit Diagnoses    Influenza A    -  Primary   Will tx with tamiflu, tessalon, and tussionex. Continue OTC remedies and supportive care. Return precautions reviewed at length   Relevant Orders   Veritor Flu A/B Waived (Completed)       Follow up plan: Return for as scheduled.

## 2017-05-03 NOTE — Telephone Encounter (Signed)
Routing to provider  

## 2017-05-03 NOTE — Telephone Encounter (Signed)
Sent in tamiflu

## 2017-05-03 NOTE — Telephone Encounter (Signed)
Looks like it went through on our end, not sure what happened. Resent to [employer]

## 2017-05-03 NOTE — Telephone Encounter (Signed)
Copied from [place] [id number]. Topic: Quick Communication - See Telephone Encounter >> May 03, 2017 10:43 AM Conception Chancy, NT wrote: CRM for notification. See Telephone encounter for:  05/03/17.  Patient is calling and states [facility] never received the rx for Baloxavir Marboxil 40 MG Dose (XOFLUZA) 20 (2) MG TBPK but they also do not have it in stock. Patient is requesting this be sent to a different pharmacy.   [facility] [address], [place] - [place]  [place] [address]  Phone: [phone] Fax: [phone]

## 2017-05-03 NOTE — Telephone Encounter (Signed)
They do not have that medication in stock, tamiflu or another will have to be sent.

## 2017-05-06 ENCOUNTER — Encounter: Payer: Self-pay | Admitting: Family Medicine

## 2017-05-06 NOTE — Patient Instructions (Signed)
Follow up as scheduled.  

## 2017-05-09 ENCOUNTER — Encounter: Payer: BC Managed Care – PPO | Admitting: Obstetrics and Gynecology

## 2017-06-05 ENCOUNTER — Encounter: Payer: Self-pay | Admitting: Family Medicine

## 2017-06-05 ENCOUNTER — Ambulatory Visit: Payer: BC Managed Care – PPO | Admitting: Family Medicine

## 2017-06-05 ENCOUNTER — Telehealth: Payer: Self-pay | Admitting: Family Medicine

## 2017-06-05 ENCOUNTER — Ambulatory Visit
Admission: RE | Admit: 2017-06-05 | Discharge: 2017-06-05 | Disposition: A | Discharge status: Home / Self Care | Payer: BC Managed Care – PPO | Source: Ambulatory Visit | Attending: Family Medicine | Admitting: Family Medicine

## 2017-06-05 VITALS — BP 143/90 | HR 100 | Temp 98.7°F | Wt 153.0 lb

## 2017-06-05 DIAGNOSIS — M25532 Pain in left wrist: Secondary | ICD-10-CM

## 2017-06-05 DIAGNOSIS — W19XXXA Unspecified fall, initial encounter: Secondary | ICD-10-CM | POA: Diagnosis not present

## 2017-06-05 DIAGNOSIS — S62102A Fracture of unspecified carpal bone, left wrist, initial encounter for closed fracture: Secondary | ICD-10-CM

## 2017-06-05 DIAGNOSIS — S52572A Other intraarticular fracture of lower end of left radius, initial encounter for closed fracture: Secondary | ICD-10-CM | POA: Diagnosis not present

## 2017-06-05 MED ORDER — HYDROCODONE-ACETAMINOPHEN 5-325 MG PO TABS: 1.0000 | ORAL_TABLET | Freq: Four times a day (QID) | ORAL | 0 refills | Status: DC | PRN

## 2017-06-05 MED ORDER — IBUPROFEN 800 MG PO TABS: 800.0000 mg | ORAL_TABLET | Freq: Three times a day (TID) | ORAL | 2 refills | Status: DC | PRN

## 2017-06-05 NOTE — Telephone Encounter (Signed)
Called pt regarding wrist fracture - per radiology "Transverse fracture of the distal radial metaphysis with an oblique component extending into the radiocarpal joint and mild dorsal displacement of a small fragment. Associated soft tissue swelling."  Will place referral to Orthopedics for further management and recommended pt proceed as discussed in office in meantime with brace, RICE, ibuprofen, and small amount of hydrocodone prn

## 2017-06-05 NOTE — Progress Notes (Signed)
   BP (!) 143/90 (BP Location: Right Arm, Patient Position: Sitting, Cuff Size: Normal)   Pulse 100   Temp 98.7 F (37.1 C) (Oral)   Wt 153 lb (69.4 kg)   LMP  (LMP Unknown)   SpO2 97%   BMI 29.88 kg/m    Subjective:    Patient ID: Deanna Velez, female    DOB: February 14, 1961, 57 y.o.   MRN: [id number]  HPI: Deanna Velez is a 57 y.o. female  Chief Complaint  Patient presents with  . Fall    Fell Saturday night 06/03/2017. Patient states pain is a 10 if she tries to use her hand ex: buttoning pants.  . Wrist Pain   Patient here today with severe 10/10  left wrist pain after falling onto outstretched hand while dancing two days ago. Having bruising, swelling, and minimal ability to move joint since. Denies color change or coolness into fingers, significant numbness or tingling into hand. Has been keeping wrist braced and elevated and taking OTC pain relievers with minimal relief.   Relevant past medical, surgical, family and social history reviewed and updated as indicated. Interim medical history since our last visit reviewed. Allergies and medications reviewed and updated.  Review of Systems  Per HPI unless specifically indicated above     Objective:    BP (!) 143/90 (BP Location: Right Arm, Patient Position: Sitting, Cuff Size: Normal)   Pulse 100   Temp 98.7 F (37.1 C) (Oral)   Wt 153 lb (69.4 kg)   LMP  (LMP Unknown)   SpO2 97%   BMI 29.88 kg/m   Wt Readings from Last 3 Encounters:  06/05/17 153 lb (69.4 kg)  05/03/17 153 lb 4.8 oz (69.5 kg)  03/01/17 152 lb 4.8 oz (69.1 kg)    Physical Exam  Constitutional: She appears well-developed and well-nourished. No distress.  HENT:  Head: Atraumatic.  Eyes: Conjunctivae are normal.  Neck: Normal range of motion. Neck supple.  Cardiovascular: Regular rhythm and intact distal pulses.  Mildly tachycardic  Pulmonary/Chest: Effort normal and breath sounds normal.  Musculoskeletal: She exhibits edema and tenderness (Significant  ttp diffusely left wrist). She exhibits no deformity.  Minimal ROM exam performed left wrist due to pt discomfort   Neurological:  B/l UEs neurovascularly intact. Good sensation in all 5 fingers of left hand  Skin: Skin is warm and dry.  Bruising along left wrist and forearm  Psychiatric: She has a normal mood and affect. Her behavior is normal.  Nursing note and vitals reviewed.   Results for orders placed or performed in visit on 05/03/17  Veritor Flu A/B Waived  Result Value Ref Range   Influenza A Positive (A) Negative   Influenza B Negative Negative      Assessment & Plan:   Problem List Items Addressed This Visit    None    Visit Diagnoses    Acute pain of left wrist    -  Primary   Await left wrist x-ray. Continue brace, RICE protocol, ibuprofen, small amount of hydrocodone sent for severe pain with sedation and addiction precautions   Relevant Orders   DG Wrist Complete Left (Completed)       Follow up plan: Return for as scheduled.

## 2017-06-07 NOTE — Patient Instructions (Signed)
Follow up as needed

## 2017-08-22 ENCOUNTER — Ambulatory Visit: Payer: BC Managed Care – PPO | Admitting: Family Medicine

## 2017-08-22 ENCOUNTER — Encounter: Payer: Self-pay | Admitting: Family Medicine

## 2017-08-22 VITALS — BP 147/94 | HR 94 | Temp 98.9°F | Ht 60.0 in | Wt 152.0 lb

## 2017-08-22 DIAGNOSIS — R05 Cough: Secondary | ICD-10-CM

## 2017-08-22 DIAGNOSIS — Z720 Tobacco use: Secondary | ICD-10-CM

## 2017-08-22 DIAGNOSIS — R0989 Other specified symptoms and signs involving the circulatory and respiratory systems: Secondary | ICD-10-CM

## 2017-08-22 DIAGNOSIS — R6889 Other general symptoms and signs: Secondary | ICD-10-CM | POA: Diagnosis not present

## 2017-08-22 DIAGNOSIS — R059 Cough, unspecified: Secondary | ICD-10-CM

## 2017-08-22 MED ORDER — CETIRIZINE HCL 10 MG PO TABS: 10.0000 mg | ORAL_TABLET | Freq: Every day | ORAL | 11 refills | Status: DC

## 2017-08-22 MED ORDER — PANTOPRAZOLE SODIUM 40 MG PO TBEC: 40.0000 mg | DELAYED_RELEASE_TABLET | Freq: Every day | ORAL | 3 refills | Status: DC

## 2017-08-22 MED ORDER — FLUTICASONE PROPIONATE 50 MCG/ACT NA SUSP
2.0000 | Freq: Every day | NASAL | 6 refills | Status: DC
Start: 2017-08-22 — End: 2018-01-16

## 2017-08-22 MED ORDER — NICOTINE 21 MG/24HR TD PT24: 21.0000 mg | MEDICATED_PATCH | Freq: Every day | TRANSDERMAL | 0 refills | Status: DC

## 2017-08-22 NOTE — Progress Notes (Signed)
BP (!) 147/94 (BP Location: Left Arm, Patient Position: Sitting, Cuff Size: Normal)   Pulse 94   Temp 98.9 F (37.2 C) (Oral)   Ht 5' (1.524 m)   Wt 152 lb (68.9 kg)   LMP  (LMP Unknown)   SpO2 98%   BMI 29.69 kg/m    Subjective:    Patient ID: Deanna Velez, female    DOB: 08/31/1960, 57 y.o.   MRN: [id number]  HPI: Deanna Velez is a 57 y.o. female  Chief Complaint  Patient presents with  . Cough    At night ongoing for a few months. Patient states she wakes up gagging. Woke up this AM felt like something was in her throat. When patient brushed her tongue there was blood.   . Loss of Appetite   Pt here today for night cough the past few months that seems to have started back when she resumed smoking. Also noting the past few weeks that she wakes up gagging and choking, feeling like something is in her throat. Some fullness when swallowing solids, but no pain or inability to swallow. Anorexia the past few days. Scrubbed the back of her tongue this morning and had a bit of bleeding with that, now very concerned about all of these sxs and worried about cancer. Denies fevers, weight loss, fatigue, hemoptysis.   Quite upset with herself for restarting on cigarettes. Started back on patches this morning.  , has started back smoking again. Restarted nicotine patches this morning. No appetite the past week. Some fullness with swallowing solids.   Relevant past medical, surgical, family and social history reviewed and updated as indicated. Interim medical history since our last visit reviewed. Allergies and medications reviewed and updated.  Review of Systems  Per HPI unless specifically indicated above     Objective:    BP (!) 147/94 (BP Location: Left Arm, Patient Position: Sitting, Cuff Size: Normal)   Pulse 94   Temp 98.9 F (37.2 C) (Oral)   Ht 5' (1.524 m)   Wt 152 lb (68.9 kg)   LMP  (LMP Unknown)   SpO2 98%   BMI 29.69 kg/m   Wt Readings from Last 3 Encounters:  08/22/17  152 lb (68.9 kg)  06/05/17 153 lb (69.4 kg)  05/03/17 153 lb 4.8 oz (69.5 kg)    Physical Exam  Constitutional: She is oriented to person, place, and time. She appears well-developed and well-nourished. No distress.  HENT:  Head: Atraumatic.  Mouth/Throat: Oropharynx is clear and moist.  Posterior tongue erythematous with small hematoma on right  Eyes: Pupils are equal, round, and reactive to light. Conjunctivae are normal.  Neck: Normal range of motion. Neck supple. No tracheal deviation present. No thyromegaly present.  Cardiovascular: Normal rate and regular rhythm.  Pulmonary/Chest: Effort normal and breath sounds normal. No respiratory distress. She has no wheezes. She has no rales.  Musculoskeletal: Normal range of motion.  Lymphadenopathy:    She has no cervical adenopathy.  Neurological: She is alert and oriented to person, place, and time.  Skin: Skin is warm and dry.  Psychiatric: Thought content normal.  anxious  Nursing note and vitals reviewed.   Results for orders placed or performed in visit on 05/03/17  Veritor Flu A/B Waived  Result Value Ref Range   Influenza A Positive (A) Negative   Influenza B Negative Negative      Assessment & Plan:   Problem List Items Addressed This Visit      Other  Tobacco use    Pt determined to quit for good this time. Restart patches, habit replacement, reviewed free courses at [facility].       Throat fullness    Discussed trying flonase, zyrtec to r/o post nasal drainage as well as starting protonix in case GERD is causing her gagging/throat fullness. Pt already established with an ENT, will schedule for further eval of fullness as she is very anxious and concerned about throat cancer      Cough - Primary    Lungs CTAB, vitals WNL. Recent chest x-rays without concerning changes. Will get low dose chest CT re-ordered given pt's significant concern for malignancy as last year's order expired. Encouraged pt's desire to quit smoking  for good. Continue albuterol prn.           Follow up plan: Return for as scheduled.

## 2017-08-23 ENCOUNTER — Telehealth: Payer: Self-pay | Admitting: *Deleted

## 2017-08-23 DIAGNOSIS — Z122 Encounter for screening for malignant neoplasm of respiratory organs: Secondary | ICD-10-CM

## 2017-08-23 DIAGNOSIS — Z87891 Personal history of nicotine dependence: Secondary | ICD-10-CM

## 2017-08-23 NOTE — Telephone Encounter (Signed)
Received referral for initial lung cancer screening scan. Contacted patient and obtained smoking history,(former, quit 08/21/17, 36 pack year) as well as answering questions related to screening process. Patient denies signs of lung cancer such as weight loss or hemoptysis. Patient denies comorbidity that would prevent curative treatment if lung cancer were found. Patient is scheduled for shared decision making visit and CT scan on 09/05/17.

## 2017-08-24 ENCOUNTER — Encounter: Payer: Self-pay | Admitting: Family Medicine

## 2017-08-24 DIAGNOSIS — Z720 Tobacco use: Secondary | ICD-10-CM | POA: Insufficient documentation

## 2017-08-24 DIAGNOSIS — R059 Cough, unspecified: Secondary | ICD-10-CM | POA: Insufficient documentation

## 2017-08-24 DIAGNOSIS — R6889 Other general symptoms and signs: Secondary | ICD-10-CM | POA: Insufficient documentation

## 2017-08-24 DIAGNOSIS — R05 Cough: Secondary | ICD-10-CM | POA: Insufficient documentation

## 2017-08-24 DIAGNOSIS — R0989 Other specified symptoms and signs involving the circulatory and respiratory systems: Secondary | ICD-10-CM | POA: Insufficient documentation

## 2017-08-24 NOTE — Assessment & Plan Note (Signed)
Lungs CTAB, vitals WNL. Recent chest x-rays without concerning changes. Will get low dose chest CT re-ordered given pt's significant concern for malignancy as last year's order expired. Encouraged pt's desire to quit smoking for good. Continue albuterol prn.

## 2017-08-24 NOTE — Assessment & Plan Note (Signed)
Pt determined to quit for good this time. Restart patches, habit replacement, reviewed free courses at [facility].

## 2017-08-24 NOTE — Assessment & Plan Note (Signed)
Discussed trying flonase, zyrtec to r/o post nasal drainage as well as starting protonix in case GERD is causing her gagging/throat fullness. Pt already established with an ENT, will schedule for further eval of fullness as she is very anxious and concerned about throat cancer

## 2017-08-24 NOTE — Patient Instructions (Signed)
Follow up as scheduled.  

## 2017-09-05 ENCOUNTER — Ambulatory Visit
Admission: RE | Admit: 2017-09-05 | Discharge: 2017-09-05 | Disposition: A | Discharge status: Home / Self Care | Payer: BC Managed Care – PPO | Source: Ambulatory Visit | Attending: Oncology | Admitting: Oncology

## 2017-09-05 ENCOUNTER — Encounter: Payer: Self-pay | Admitting: Oncology

## 2017-09-05 ENCOUNTER — Inpatient Hospital Stay: Payer: BC Managed Care – PPO | Attending: Oncology | Admitting: Oncology

## 2017-09-05 DIAGNOSIS — J432 Centrilobular emphysema: Secondary | ICD-10-CM | POA: Diagnosis not present

## 2017-09-05 DIAGNOSIS — I700 Atherosclerosis of aorta: Secondary | ICD-10-CM | POA: Diagnosis not present

## 2017-09-05 DIAGNOSIS — K760 Fatty (change of) liver, not elsewhere classified: Secondary | ICD-10-CM | POA: Diagnosis not present

## 2017-09-05 DIAGNOSIS — Z122 Encounter for screening for malignant neoplasm of respiratory organs: Secondary | ICD-10-CM | POA: Diagnosis not present

## 2017-09-05 DIAGNOSIS — J438 Other emphysema: Secondary | ICD-10-CM | POA: Insufficient documentation

## 2017-09-05 DIAGNOSIS — Z87891 Personal history of nicotine dependence: Secondary | ICD-10-CM

## 2017-09-05 NOTE — Progress Notes (Signed)
In accordance with CMS guidelines, patient has met eligibility criteria including age, absence of signs or symptoms of lung cancer.  Social History   Tobacco Use  . Smoking status: Former Smoker    Packs/day: 1.00    Years: 36.00    Pack years: 36.00    Types: Cigarettes    Last attempt to quit: 08/21/2017    Years since quitting: 0.0  . Smokeless tobacco: Never Used  . Tobacco comment: Patient quit 12/2016, but started again-2019,  Substance Use Topics  . Alcohol use: Yes    Alcohol/week: 0.0 oz    Comment: beer on the weekends  . Drug use: No     A shared decision-making session was conducted prior to the performance of CT scan. This includes one or more decision aids, includes benefits and harms of screening, follow-up diagnostic testing, over-diagnosis, false positive rate, and total radiation exposure.  Counseling on the importance of adherence to annual lung cancer LDCT screening, impact of co-morbidities, and ability or willingness to undergo diagnosis and treatment is imperative for compliance of the program.  Counseling on the importance of continued smoking cessation for former smokers; the importance of smoking cessation for current smokers, and information about tobacco cessation interventions have been given to patient including [place] and 1800 quit [place] programs.  Written order for lung cancer screening with LDCT has been given to the patient and any and all questions have been answered to the best of my abilities.   Yearly follow up will be coordinated by Burgess Estelle, Thoracic Navigator.  Faythe Casa, NP 09/05/2017 12:21 PM

## 2017-09-07 ENCOUNTER — Telehealth: Payer: Self-pay | Admitting: *Deleted

## 2017-09-07 NOTE — Telephone Encounter (Signed)
Notified patient of LDCT lung cancer screening program results with recommendation for 12 month follow up imaging. Also notified of incidental findings noted below and is encouraged to discuss further with PCP who will receive a copy of this note and/or the CT report. Patient verbalizes understanding.   IMPRESSION: 1. Lung-RADS 2, benign appearance or behavior. Continue annual screening with low-dose chest CT without contrast in 12 months. 2. Mild diffuse bronchial wall thickening with mild centrilobular and paraseptal emphysema; imaging findings suggestive of underlying COPD. 3. Aortic atherosclerosis. 4. Hepatic steatosis.  Aortic Atherosclerosis (ICD10-I70.0) and Emphysema (ICD10-J43.9).  

## 2017-10-02 ENCOUNTER — Other Ambulatory Visit: Payer: Self-pay | Admitting: Family Medicine

## 2017-10-02 NOTE — Telephone Encounter (Signed)
losartan refill Last Refill:03/01/17 # 90 tab 1 RF Last OV: 03/01/17 PCP: Merrie Roof PA  [place]  Next office visit due 11/24/17

## 2017-11-24 ENCOUNTER — Encounter: Payer: BC Managed Care – PPO | Admitting: Family Medicine

## 2017-12-01 ENCOUNTER — Encounter: Payer: BC Managed Care – PPO | Admitting: Family Medicine

## 2018-01-16 ENCOUNTER — Ambulatory Visit: Payer: BC Managed Care – PPO | Admitting: Family Medicine

## 2018-01-16 ENCOUNTER — Encounter: Payer: Self-pay | Admitting: Family Medicine

## 2018-01-16 ENCOUNTER — Other Ambulatory Visit: Payer: Self-pay

## 2018-01-16 VITALS — BP 134/89 | HR 93 | Temp 98.7°F | Ht 60.0 in | Wt 154.5 lb

## 2018-01-16 DIAGNOSIS — I10 Essential (primary) hypertension: Secondary | ICD-10-CM | POA: Diagnosis not present

## 2018-01-16 DIAGNOSIS — M79672 Pain in left foot: Secondary | ICD-10-CM

## 2018-01-16 MED ORDER — LOSARTAN POTASSIUM 100 MG PO TABS: 100.0000 mg | ORAL_TABLET | Freq: Every day | ORAL | 1 refills | Status: DC

## 2018-01-16 NOTE — Progress Notes (Signed)
BP 134/89   Pulse 93   Temp 98.7 F (37.1 C) (Oral)   Ht 5' (1.524 m)   Wt 154 lb 8 oz (70.1 kg)   LMP  (LMP Unknown)   SpO2 96%   BMI 30.17 kg/m    Subjective:    Patient ID: Deanna Velez, female    DOB: 1961-01-20, 57 y.o.   MRN: [id number]  HPI: Deanna Velez is a 57 y.o. female  Chief Complaint  Patient presents with  . Hypertension    losartan refill  . Foot Pain    left foot pain on and off for aboout two years   Here today for BP f/u. Doing much better taking her medicine at night, helps significantly with side effects. BPs WNL when checked outside of clinic. Denies CP, SOB, dizziness, HAs.   Left foot aching and burning pain intermittently for several years. Inner portion of the foot.  No known injury. Lasts several days and causes a limp. Wearing orthotic shoes and taking NSAIDs which does help.   Past Medical History:  Diagnosis Date  . Gallbladder attack   . Hypertension   . Lack of libido   . PMB (postmenopausal bleeding)   . Ringworm   . Vitamin D deficiency disease    Social History   Socioeconomic History  . Marital status: Married    Spouse name: Not on file  . Number of children: Not on file  . Years of education: Not on file  . Highest education level: Not on file  Occupational History  . Not on file  Social Needs  . Financial resource strain: Not on file  . Food insecurity:    Worry: Not on file    Inability: Not on file  . Transportation needs:    Medical: Not on file    Non-medical: Not on file  Tobacco Use  . Smoking status: Former Smoker    Packs/day: 1.00    Years: 36.00    Pack years: 36.00    Types: Cigarettes    Last attempt to quit: 08/21/2017    Years since quitting: 0.4  . Smokeless tobacco: Never Used  . Tobacco comment: Patient quit 12/2016, but started again-2019,  Substance and Sexual Activity  . Alcohol use: Yes    Alcohol/week: 0.0 standard drinks    Comment: beer on the weekends  . Drug use: No  . Sexual activity:  Yes    Birth control/protection: Surgical    Comment: tubal   Lifestyle  . Physical activity:    Days per week: Not on file    Minutes per session: Not on file  . Stress: Not on file  Relationships  . Social connections:    Talks on phone: Not on file    Gets together: Not on file    Attends religious service: Not on file    Active member of club or organization: Not on file    Attends meetings of clubs or organizations: Not on file    Relationship status: Not on file  . Intimate partner violence:    Fear of current or ex partner: Not on file    Emotionally abused: Not on file    Physically abused: Not on file    Forced sexual activity: Not on file  Other Topics Concern  . Not on file  Social History Narrative  . Not on file    Relevant past medical, surgical, family and social history reviewed and updated as indicated. Interim medical history  since our last visit reviewed. Allergies and medications reviewed and updated.  Review of Systems  Per HPI unless specifically indicated above     Objective:    BP 134/89   Pulse 93   Temp 98.7 F (37.1 C) (Oral)   Ht 5' (1.524 m)   Wt 154 lb 8 oz (70.1 kg)   LMP  (LMP Unknown)   SpO2 96%   BMI 30.17 kg/m   Wt Readings from Last 3 Encounters:  01/16/18 154 lb 8 oz (70.1 kg)  09/05/17 150 lb (68 kg)  08/22/17 152 lb (68.9 kg)    Physical Exam  Constitutional: She is oriented to person, place, and time. She appears well-developed and well-nourished. No distress.  HENT:  Head: Atraumatic.  Eyes: Conjunctivae and EOM are normal.  Neck: Normal range of motion. Neck supple.  Cardiovascular: Normal rate, regular rhythm and normal heart sounds.  Pulmonary/Chest: Effort normal and breath sounds normal.  Musculoskeletal: Normal range of motion. She exhibits no edema or tenderness (No current ttp left foot).  Neurological: She is alert and oriented to person, place, and time. No cranial nerve deficit or sensory deficit.  Skin:  Skin is warm and dry. No erythema.  Psychiatric: She has a normal mood and affect. Her behavior is normal.  Nursing note and vitals reviewed.   Results for orders placed or performed in visit on 01/16/18  HgB A1c  Result Value Ref Range   Hgb A1c MFr Bld 5.4 4.8 - 5.6 %   Est. average glucose Bld gHb Est-mCnc 108 mg/dL  Basic Metabolic Panel (BMET)  Result Value Ref Range   Glucose 106 (H) 65 - 99 mg/dL   BUN 11 6 - 24 mg/dL   Creatinine, Ser 0.71 0.57 - 1.00 mg/dL   GFR calc non Af Amer 96 >59 mL/min/1.73   GFR calc Af Amer 110 >59 mL/min/1.73   BUN/Creatinine Ratio 15 9 - 23   Sodium 137 134 - 144 mmol/L   Potassium 4.3 3.5 - 5.2 mmol/L   Chloride 102 96 - 106 mmol/L   CO2 19 (L) 20 - 29 mmol/L   Calcium 9.5 8.7 - 10.2 mg/dL      Assessment & Plan:   Problem List Items Addressed This Visit      Cardiovascular and Mediastinum   Hypertension - Primary    BPs stable and WNL, continue current regimen. Work on lifestyle modifications, especially smoking cessation      Relevant Medications   losartan (COZAAR) 100 MG tablet   Other Relevant Orders   Basic Metabolic Panel (BMET) (Completed)    Other Visit Diagnoses    Foot pain, left       Will obtain x-ray and refer to specialist if not improving with stretches, epsom salt soaks, NSAIDs.    Relevant Orders   HgB A1c (Completed)   DG Foot Complete Left       Follow up plan: Return in about 6 months (around 07/17/2018) for HTN.

## 2018-01-17 ENCOUNTER — Encounter: Payer: Self-pay | Admitting: Family Medicine

## 2018-01-17 LAB — BASIC METABOLIC PANEL
BUN/Creatinine Ratio: 15 (ref 9–23)
BUN: 11 mg/dL (ref 6–24)
CALCIUM: 9.5 mg/dL (ref 8.7–10.2)
CHLORIDE: 102 mmol/L (ref 96–106)
CO2: 19 mmol/L — AB (ref 20–29)
Creatinine, Ser: 0.71 mg/dL (ref 0.57–1.00)
GFR calc Af Amer: 110 mL/min/{1.73_m2} (ref >59)
GFR, EST NON AFRICAN AMERICAN: 96 mL/min/{1.73_m2} (ref >59)
GLUCOSE: 106 mg/dL — AB (ref 65–99)
Potassium: 4.3 mmol/L (ref 3.5–5.2)
SODIUM: 137 mmol/L (ref 134–144)

## 2018-01-17 LAB — HEMOGLOBIN A1C
Est. average glucose Bld gHb Est-mCnc: 108 mg/dL
HEMOGLOBIN A1C: 5.4 % (ref 4.8–5.6)

## 2018-01-21 NOTE — Patient Instructions (Signed)
Follow up in 6 months 

## 2018-01-21 NOTE — Assessment & Plan Note (Signed)
BPs stable and WNL, continue current regimen. Work on lifestyle modifications, especially smoking cessation

## 2018-03-23 ENCOUNTER — Ambulatory Visit: Payer: BC Managed Care – PPO | Admitting: Family Medicine

## 2018-03-23 ENCOUNTER — Encounter: Payer: Self-pay | Admitting: Family Medicine

## 2018-03-23 VITALS — BP 152/110 | HR 82 | Temp 98.4°F | Wt 157.1 lb

## 2018-03-23 DIAGNOSIS — Z87891 Personal history of nicotine dependence: Secondary | ICD-10-CM | POA: Insufficient documentation

## 2018-03-23 DIAGNOSIS — F17210 Nicotine dependence, cigarettes, uncomplicated: Secondary | ICD-10-CM | POA: Diagnosis not present

## 2018-03-23 DIAGNOSIS — J069 Acute upper respiratory infection, unspecified: Secondary | ICD-10-CM | POA: Diagnosis not present

## 2018-03-23 MED ORDER — AZITHROMYCIN 250 MG PO TABS: ORAL_TABLET | ORAL | 0 refills | Status: DC

## 2018-03-23 MED ORDER — HYDROCOD POLST-CPM POLST ER 10-8 MG/5ML PO SUER: 5.0000 mL | Freq: Every evening | ORAL | 0 refills | Status: DC | PRN

## 2018-03-23 MED ORDER — SILVER SULFADIAZINE 1 % EX CREA: 1.0000 "application " | TOPICAL_CREAM | Freq: Every day | CUTANEOUS | 0 refills | Status: DC

## 2018-03-23 MED ORDER — BENZONATATE 200 MG PO CAPS: 200.0000 mg | ORAL_CAPSULE | Freq: Three times a day (TID) | ORAL | 0 refills | Status: DC | PRN

## 2018-03-23 MED ORDER — PREDNISONE 10 MG PO TABS: ORAL_TABLET | ORAL | 0 refills | Status: DC

## 2018-03-23 NOTE — Progress Notes (Signed)
BP (!) 152/110 (BP Location: Left Arm, Cuff Size: Normal)   Pulse 82   Temp 98.4 F (36.9 C) (Oral)   Wt 157 lb 1.6 oz (71.3 kg)   LMP  (LMP Unknown)   SpO2 97%   BMI 30.68 kg/m    Subjective:    Patient ID: Deanna Velez, female    DOB: January 08, 1961, 58 y.o.   MRN: [id number]  HPI: Deanna Velez is a 58 y.o. female  Chief Complaint  Patient presents with  . URI    pt states she has had a cough, congestion, sinus pressure, and headache since Monday    Productive cough, fatigue, congestion, sinus pain and pressure, wheezing, chest tightness x 5 days. Taking zicam without relief. Denies fevers, chills, body aches, sweats, N/V/D. Current smoker, very prone to prolonged bronchitis with hx of pneumonia. Several sick contacts, most recently her granddaughter.   Relevant past medical, surgical, family and social history reviewed and updated as indicated. Interim medical history since our last visit reviewed. Allergies and medications reviewed and updated.  Review of Systems  Per HPI unless specifically indicated above     Objective:    BP (!) 152/110 (BP Location: Left Arm, Cuff Size: Normal)   Pulse 82   Temp 98.4 F (36.9 C) (Oral)   Wt 157 lb 1.6 oz (71.3 kg)   LMP  (LMP Unknown)   SpO2 97%   BMI 30.68 kg/m   Wt Readings from Last 3 Encounters:  03/23/18 157 lb 1.6 oz (71.3 kg)  01/16/18 154 lb 8 oz (70.1 kg)  09/05/17 150 lb (68 kg)    Physical Exam Vitals signs and nursing note reviewed.  Constitutional:      Appearance: Normal appearance.  HENT:     Head: Atraumatic.     Right Ear: Tympanic membrane and external ear normal.     Left Ear: Tympanic membrane and external ear normal.     Nose: Congestion present.     Mouth/Throat:     Mouth: Mucous membranes are moist.     Pharynx: Posterior oropharyngeal erythema present.  Eyes:     Extraocular Movements: Extraocular movements intact.     Conjunctiva/sclera: Conjunctivae normal.  Neck:     Musculoskeletal: Normal  range of motion and neck supple.  Cardiovascular:     Rate and Rhythm: Normal rate and regular rhythm.     Heart sounds: Normal heart sounds.  Pulmonary:     Effort: Pulmonary effort is normal.     Breath sounds: Normal breath sounds. No wheezing.  Musculoskeletal: Normal range of motion.  Skin:    General: Skin is warm and dry.  Neurological:     Mental Status: She is alert and oriented to person, place, and time.  Psychiatric:        Mood and Affect: Mood normal.        Thought Content: Thought content normal.     Results for orders placed or performed in visit on 01/16/18  HgB A1c  Result Value Ref Range   Hgb A1c MFr Bld 5.4 4.8 - 5.6 %   Est. average glucose Bld gHb Est-mCnc 108 mg/dL  Basic Metabolic Panel (BMET)  Result Value Ref Range   Glucose 106 (H) 65 - 99 mg/dL   BUN 11 6 - 24 mg/dL   Creatinine, Ser 0.71 0.57 - 1.00 mg/dL   GFR calc non Af Amer 96 >59 mL/min/1.73   GFR calc Af Amer 110 >59 mL/min/1.73   BUN/Creatinine Ratio  15 9 - 23   Sodium 137 134 - 144 mmol/L   Potassium 4.3 3.5 - 5.2 mmol/L   Chloride 102 96 - 106 mmol/L   CO2 19 (L) 20 - 29 mmol/L   Calcium 9.5 8.7 - 10.2 mg/dL      Assessment & Plan:   Problem List Items Addressed This Visit      Other   Cigarette smoker    Strongly recommended quitting, pt not ready at this time. Greater than 3 min spent today in direct cessation counseling       Other Visit Diagnoses    Upper respiratory tract infection, unspecified type    -  Primary   Start prednisone taper, tessalon, tussionex, OTC supportive care. If not better over weekend start zpak. Call if no improvement at that time   Relevant Medications   azithromycin (ZITHROMAX) 250 MG tablet       Follow up plan: Return for as scheduled.

## 2018-03-23 NOTE — Assessment & Plan Note (Signed)
Strongly recommended quitting, pt not ready at this time. Greater than 3 min spent today in direct cessation counseling

## 2018-05-04 ENCOUNTER — Other Ambulatory Visit: Payer: Self-pay | Admitting: Family Medicine

## 2018-05-04 NOTE — Telephone Encounter (Signed)
Requested Prescriptions  Pending Prescriptions Disp Refills  . benzonatate (TESSALON) 200 MG capsule [Pharmacy Med Name: BENZONATATE 200 MG CAPSULE] 30 capsule 0    Sig: TAKE ONE CAPSULE BY MOUTH THREE TIMES A DAY     Ear, Nose, and Throat:  Antitussives/Expectorants Passed - 05/04/2018  1:36 PM      Passed - Valid encounter within last 12 months    Recent Outpatient Visits          1 month ago Upper respiratory tract infection, unspecified type   [facility] [place], [place]   3 months ago Essential hypertension   [place], [place], [place]   8 months ago Cough   [place], [place], [place]   11 months ago Acute pain of left wrist   [facility] [place], [place]   1 year ago Influenza [place], [place], [place]      Future Appointments            In 3 months [facility], [place], [place], [place]

## 2018-07-20 ENCOUNTER — Other Ambulatory Visit: Payer: Self-pay | Admitting: Family Medicine

## 2018-08-31 ENCOUNTER — Encounter: Payer: BC Managed Care – PPO | Admitting: Family Medicine

## 2018-09-03 ENCOUNTER — Encounter: Payer: Self-pay | Admitting: Family Medicine

## 2018-09-03 ENCOUNTER — Ambulatory Visit (INDEPENDENT_AMBULATORY_CARE_PROVIDER_SITE_OTHER): Payer: BC Managed Care – PPO | Admitting: Family Medicine

## 2018-09-03 ENCOUNTER — Other Ambulatory Visit: Payer: Self-pay

## 2018-09-03 VITALS — BP 160/99 | HR 92 | Temp 97.9°F

## 2018-09-03 DIAGNOSIS — I10 Essential (primary) hypertension: Secondary | ICD-10-CM

## 2018-09-03 MED ORDER — AMLODIPINE BESYLATE 5 MG PO TABS: 5.0000 mg | ORAL_TABLET | Freq: Every day | ORAL | 0 refills | Status: DC

## 2018-09-03 NOTE — Progress Notes (Signed)
BP (!) 160/99   Pulse 92   Temp 97.9 F (36.6 C)   LMP  (LMP Unknown)    Subjective:    Patient ID: Deanna Velez, female    DOB: 09-22-1960, 58 y.o.   MRN: [id number]  HPI: Deanna Velez is a 58 y.o. female  Chief Complaint  Patient presents with  . Medication Refill    [id number]  . Hypertension    . This visit was completed via WebEx due to the restrictions of the COVID-19 pandemic. All issues as above were discussed and addressed. Physical exam was done as above through visual confirmation on WebEx. If it was felt that the patient should be evaluated in the office, they were directed there. The patient verbally consented to this visit. . Location of the patient: home . Location of the provider: home . Those involved with this call:  . Provider: Merrie Roof, PA-C . CMA: Tiffany Reel, CMA . Front Desk/Registration: Jill Side  . Time spent on call: 15 minutes with patient face to face via video conference. More than 50% of this time was spent in counseling and coordination of care. 5 minutes total spent in review of patient's record and preparation of their chart. I verified patient identity using two factors (patient name and date of birth). Patient consents verbally to being seen via telemedicine visit today.   Started having headaches the past few weeks which is very out of the ordinary for her. Posterior headaches, not her typical migraines. Started checking her BPs and they've been consistently high - 160s/90s-100s. Takes losartan 100 mg faithfully, typically has done well on this dose with controlled BPs up until recently. Denies CP, SOB, dizziness, syncope.   Relevant past medical, surgical, family and social history reviewed and updated as indicated. Interim medical history since our last visit reviewed. Allergies and medications reviewed and updated.  Review of Systems  Per HPI unless specifically indicated above     Objective:    BP (!) 160/99   Pulse 92   Temp  97.9 F (36.6 C)   LMP  (LMP Unknown)   Wt Readings from Last 3 Encounters:  03/23/18 157 lb 1.6 oz (71.3 kg)  01/16/18 154 lb 8 oz (70.1 kg)  09/05/17 150 lb (68 kg)    Physical Exam Vitals signs and nursing note reviewed.  Constitutional:      General: She is not in acute distress.    Appearance: Normal appearance.  HENT:     Head: Atraumatic.     Right Ear: External ear normal.     Left Ear: External ear normal.     Nose: Nose normal. No congestion.     Mouth/Throat:     Mouth: Mucous membranes are moist.     Pharynx: Oropharynx is clear. No posterior oropharyngeal erythema.  Eyes:     Extraocular Movements: Extraocular movements intact.     Conjunctiva/sclera: Conjunctivae normal.  Neck:     Musculoskeletal: Normal range of motion.  Cardiovascular:     Comments: Unable to assess via virtual visit Pulmonary:     Effort: Pulmonary effort is normal. No respiratory distress.  Musculoskeletal: Normal range of motion.  Skin:    General: Skin is dry.     Findings: No erythema.  Neurological:     Mental Status: She is alert and oriented to person, place, and time.  Psychiatric:        Mood and Affect: Mood normal.        Thought Content:  Thought content normal.        Judgment: Judgment normal.     Results for orders placed or performed in visit on 01/16/18  HgB A1c  Result Value Ref Range   Hgb A1c MFr Bld 5.4 4.8 - 5.6 %   Est. average glucose Bld gHb Est-mCnc 108 mg/dL  Basic Metabolic Panel (BMET)  Result Value Ref Range   Glucose 106 (H) 65 - 99 mg/dL   BUN 11 6 - 24 mg/dL   Creatinine, Ser 0.71 0.57 - 1.00 mg/dL   GFR calc non Af Amer 96 >59 mL/min/1.73   GFR calc Af Amer 110 >59 mL/min/1.73   BUN/Creatinine Ratio 15 9 - 23   Sodium 137 134 - 144 mmol/L   Potassium 4.3 3.5 - 5.2 mmol/L   Chloride 102 96 - 106 mmol/L   CO2 19 (L) 20 - 29 mmol/L   Calcium 9.5 8.7 - 10.2 mg/dL      Assessment & Plan:   Problem List Items Addressed This Visit       Cardiovascular and Mediastinum   Hypertension - Primary    Will add 5 mg amlodipine to current regimen and recheck in 1 month. Continue checking home BPs, f/u if staying consistently elevated or continuing to have headaches. DASH diet, increase exercise      Relevant Medications   amLODipine (NORVASC) 5 MG tablet       Follow up plan: Return in about 4 weeks (around 10/01/2018) for BP.

## 2018-09-03 NOTE — Assessment & Plan Note (Signed)
Will add 5 mg amlodipine to current regimen and recheck in 1 month. Continue checking home BPs, f/u if staying consistently elevated or continuing to have headaches. DASH diet, increase exercise

## 2018-09-05 ENCOUNTER — Ambulatory Visit: Payer: BC Managed Care – PPO | Admitting: Family Medicine

## 2018-09-06 ENCOUNTER — Telehealth: Payer: Self-pay | Admitting: Family Medicine

## 2018-09-06 NOTE — Telephone Encounter (Signed)
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 Pt states that she received this last night and has to be turned in by 09/12/18. Pt would like a call back from [address].    Please advise

## 2018-09-06 NOTE — Telephone Encounter (Signed)
LVM to let pt know we would call her back.

## 2018-09-07 ENCOUNTER — Encounter: Payer: Self-pay | Admitting: Family Medicine

## 2018-09-07 ENCOUNTER — Telehealth: Payer: Self-pay

## 2018-09-07 DIAGNOSIS — J439 Emphysema, unspecified: Secondary | ICD-10-CM | POA: Insufficient documentation

## 2018-09-07 DIAGNOSIS — J432 Centrilobular emphysema: Secondary | ICD-10-CM | POA: Insufficient documentation

## 2018-09-07 NOTE — Telephone Encounter (Signed)
Called patient to notified paperwork is ready for pick up. Patient stated we can fax the forms over and she will come by on Monday to pick them up as well.

## 2018-09-08 ENCOUNTER — Telehealth: Payer: Self-pay | Admitting: *Deleted

## 2018-09-08 NOTE — Telephone Encounter (Signed)
Left message for patient to notify them that it is time to schedule annual low dose lung cancer screening CT scan. Instructed patient to call back to verify information prior to the scan being scheduled.  

## 2018-09-10 NOTE — Telephone Encounter (Signed)
Form was completed Friday afternoon and placed in the completed paperwork basket

## 2018-09-10 NOTE — Telephone Encounter (Signed)
Pt came in after lunch and picked up forms

## 2018-09-26 ENCOUNTER — Telehealth: Payer: Self-pay | Admitting: *Deleted

## 2018-09-26 DIAGNOSIS — Z122 Encounter for screening for malignant neoplasm of respiratory organs: Secondary | ICD-10-CM

## 2018-09-26 DIAGNOSIS — Z87891 Personal history of nicotine dependence: Secondary | ICD-10-CM

## 2018-09-26 NOTE — Telephone Encounter (Signed)
Patient has been notified that annual lung cancer screening low dose CT scan is due currently or will be in near future. Confirmed that patient is within the age range of 55-77, and asymptomatic, (no signs or symptoms of lung cancer). Patient denies illness that would prevent curative treatment for lung cancer if found. Verified smoking history, (current, 37 pack year). The shared decision making visit was done 09/05/17. Patient is agreeable for CT scan being scheduled.

## 2018-10-01 ENCOUNTER — Ambulatory Visit: Admission: RE | Admit: 2018-10-01 | Payer: BC Managed Care – PPO | Source: Ambulatory Visit

## 2018-10-01 ENCOUNTER — Other Ambulatory Visit: Payer: Self-pay | Admitting: Family Medicine

## 2018-10-04 ENCOUNTER — Other Ambulatory Visit: Payer: Self-pay

## 2018-10-04 ENCOUNTER — Encounter: Payer: Self-pay | Admitting: Family Medicine

## 2018-10-04 ENCOUNTER — Ambulatory Visit (INDEPENDENT_AMBULATORY_CARE_PROVIDER_SITE_OTHER): Payer: BC Managed Care – PPO | Admitting: Family Medicine

## 2018-10-04 VITALS — BP 121/83 | HR 98 | Temp 99.1°F | Ht 58.5 in | Wt 164.0 lb

## 2018-10-04 DIAGNOSIS — R1033 Periumbilical pain: Secondary | ICD-10-CM | POA: Diagnosis not present

## 2018-10-04 DIAGNOSIS — J069 Acute upper respiratory infection, unspecified: Secondary | ICD-10-CM | POA: Diagnosis not present

## 2018-10-04 DIAGNOSIS — Z0000 Encounter for general adult medical examination without abnormal findings: Secondary | ICD-10-CM

## 2018-10-04 DIAGNOSIS — I10 Essential (primary) hypertension: Secondary | ICD-10-CM | POA: Diagnosis not present

## 2018-10-04 LAB — UA/M W/RFLX CULTURE, ROUTINE
Bilirubin, UA: NEGATIVE
Glucose, UA: NEGATIVE
Leukocytes,UA: NEGATIVE
Nitrite, UA: NEGATIVE
Protein,UA: NEGATIVE
RBC, UA: NEGATIVE
Specific Gravity, UA: 1.025 (ref 1.005–1.030)
Urobilinogen, Ur: 1 mg/dL (ref 0.2–1.0)
pH, UA: 6 (ref 5.0–7.5)

## 2018-10-04 MED ORDER — AMLODIPINE BESYLATE 5 MG PO TABS: 5.0000 mg | ORAL_TABLET | Freq: Every day | ORAL | 1 refills | Status: DC

## 2018-10-04 MED ORDER — LOSARTAN POTASSIUM 100 MG PO TABS: 100.0000 mg | ORAL_TABLET | Freq: Every day | ORAL | 0 refills | Status: DC

## 2018-10-04 NOTE — Patient Instructions (Signed)
Take full tab of amlodipine and half tab of losartan (to make 50 mg). Check home readings and call or message in about 2 weeks if readings are still high or are going too low.

## 2018-10-04 NOTE — Progress Notes (Signed)
BP 121/83   Pulse 98   Temp 99.1 F (37.3 C) (Oral)   Ht 4' 10.5" (1.486 m)   Wt 164 lb (74.4 kg)   LMP  (LMP Unknown)   SpO2 90%   BMI 33.69 kg/m    Subjective:    Patient ID: Deanna Velez, female    DOB: [birth year], 58 y.o.   MRN: [id number]  HPI: Deanna Velez is a 58 y.o. female presenting on 10/04/2018 for comprehensive medical examination. Current medical complaints include:see below  HTN - Home readings are still 140s-160/90s. Currently taking the amlodipine 5 mg, states she misunderstood that she was to add that previously and stopped her losartan at the time it was added. Tolerating well without side effects. Does have some occasional headaches but no CP, SOB, syncope.   Progressively worsening pain right above belly button x 2 years. Now having to push something back in when she lifts things or moves certain ways. CT abdomen 2 years ago WNL on initial workup, but pain worse now and patient requesting further evaluation. Denies redness, fevers, N/V/D.   She currently lives with: Menopausal Symptoms: no  Depression Screen done today and results listed below:  Depression screen [facility] 2/9 10/04/2018 01/16/2018 11/22/2016 02/27/2015  Decreased Interest 0 0 0 0  Down, Depressed, Hopeless 0 0 0 0  PHQ - 2 Score 0 0 0 0  Altered sleeping 0 - - -  Tired, decreased energy 0 - - -  Change in appetite 0 - - -  Feeling bad or failure about yourself  0 1 - -  Trouble concentrating 0 - - -  Moving slowly or fidgety/restless 0 - - -  Suicidal thoughts 0 - - -  PHQ-9 Score 0 - - -    The patient does not have a history of falls. I did complete a risk assessment for falls. A plan of care for falls was documented.   Past Medical History:  Past Medical History:  Diagnosis Date  . Gallbladder attack   . Hypertension   . Lack of libido   . PMB (postmenopausal bleeding)   . Ringworm   . Vitamin D deficiency disease     Surgical History:  Past Surgical History:  Procedure Laterality  Date  . TOE SURGERY Right 05/2014  . TONSILLECTOMY    . TUBAL LIGATION      Medications:  Current Outpatient Medications on File Prior to Visit  Medication Sig  . EPINEPHrine (EPIPEN IJ) Inject as directed.  Marland Kitchen ibuprofen (ADVIL,MOTRIN) 800 MG tablet Take 1 tablet (800 mg total) by mouth every 8 (eight) hours as needed.  . Multiple Vitamins-Minerals (MULTIVITAMIN ADULT PO) Take by mouth daily.  . nicotine (NICOTINE STEP 1) 21 mg/24hr patch Place 1 patch (21 mg total) onto the skin daily.  Marland Kitchen POTASSIUM PO Take 99 mg daily by mouth.    No current facility-administered medications on file prior to visit.     Allergies:  Allergies  Allergen Reactions  . 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 Beef-Derived Products   . Pork-Derived Products     Social History:  Social History   Socioeconomic History  . Marital status: Married    Spouse name: Not on file  . Number of children: Not on file  . Years of education: Not on file  . Highest education level: Not on file  Occupational History  . Not on file  Social Needs  . Financial  resource strain: Not on file  . Food insecurity    Worry: Not on file    Inability: Not on file  . Transportation needs    Medical: Not on file    Non-medical: Not on file  Tobacco Use  . Smoking status: Current Every Day Smoker    Packs/day: 0.75    Years: 36.00    Pack years: 27.00    Types: Cigarettes  . Smokeless tobacco: Never Used  . Tobacco comment: Patient quit 12/2016, but started again-2019,  Substance and Sexual Activity  . Alcohol use: Yes    Alcohol/week: 0.0 standard drinks    Comment: beer on the weekends  . Drug use: No  . Sexual activity: Yes    Birth control/protection: Surgical    Comment: tubal   Lifestyle  . Physical activity    Days per week: Not on file    Minutes per session: Not on file  . Stress: Not on file  Relationships  . Social Herbalist on phone: Not on file     Gets together: Not on file    Attends religious service: Not on file    Active member of club or organization: Not on file    Attends meetings of clubs or organizations: Not on file    Relationship status: Not on file  . Intimate partner violence    Fear of current or ex partner: Not on file    Emotionally abused: Not on file    Physically abused: Not on file    Forced sexual activity: Not on file  Other Topics Concern  . Not on file  Social History Narrative  . Not on file   Social History   Tobacco Use  Smoking Status Current Every Day Smoker  . Packs/day: 0.75  . Years: 36.00  . Pack years: 27.00  . Types: Cigarettes  Smokeless Tobacco Never Used  Tobacco Comment   Patient quit 12/2016, but started again-2019,   Social History   Substance and Sexual Activity  Alcohol Use Yes  . Alcohol/week: 0.0 standard drinks   Comment: beer on the weekends    Family History:  Family History  Problem Relation Age of Onset  . Hyperlipidemia Mother   . Diabetes Mother   . Heart disease Mother   . Cancer Sister        leukemia  . Diabetes Sister   . Diabetes Maternal Grandmother   . Hyperlipidemia Sister   . Diabetes Sister   . Stroke Sister   . Breast cancer Neg Hx     Past medical history, surgical history, medications, allergies, family history and social history reviewed with patient today and changes made to appropriate areas of the chart.   Review of Systems - General ROS: negative Psychological ROS: negative Ophthalmic ROS: negative ENT ROS: negative Allergy and Immunology ROS: negative Hematological and Lymphatic ROS: negative Endocrine ROS: negative Breast ROS: negative for breast lumps Respiratory ROS: no cough, shortness of breath, or wheezing Cardiovascular ROS: no chest pain or dyspnea on exertion Gastrointestinal ROS: positive for - abdominal pain Genito-Urinary ROS: no dysuria, trouble voiding, or hematuria Musculoskeletal ROS: negative Neurological  ROS: no TIA or stroke symptoms Dermatological ROS: negative All other ROS negative except what is listed above and in the HPI.      Objective:    BP 121/83   Pulse 98   Temp 99.1 F (37.3 C) (Oral)   Ht 4' 10.5" (1.486 m)   Wt 164  lb (74.4 kg)   LMP  (LMP Unknown)   SpO2 90%   BMI 33.69 kg/m   Wt Readings from Last 3 Encounters:  10/10/18 164 lb (74.4 kg)  10/04/18 164 lb (74.4 kg)  03/23/18 157 lb 1.6 oz (71.3 kg)    Physical Exam Vitals signs and nursing note reviewed.  Constitutional:      General: She is not in acute distress.    Appearance: She is well-developed.  HENT:     Head: Atraumatic.     Right Ear: External ear normal.     Left Ear: External ear normal.     Nose: Nose normal.     Mouth/Throat:     Pharynx: No oropharyngeal exudate.  Eyes:     General: No scleral icterus.    Conjunctiva/sclera: Conjunctivae normal.     Pupils: Pupils are equal, round, and reactive to light.  Neck:     Musculoskeletal: Normal range of motion and neck supple.     Thyroid: No thyromegaly.  Cardiovascular:     Rate and Rhythm: Normal rate and regular rhythm.     Heart sounds: Normal heart sounds.  Pulmonary:     Effort: Pulmonary effort is normal. No respiratory distress.     Breath sounds: Normal breath sounds.  Chest:     Breasts:        Right: No mass, skin change or tenderness.        Left: No mass, skin change or tenderness.  Abdominal:     General: Bowel sounds are normal.     Palpations: Abdomen is soft. There is no mass.     Tenderness: There is no abdominal tenderness.  Genitourinary:    Comments: GU exam declined Musculoskeletal: Normal range of motion.        General: No tenderness.  Lymphadenopathy:     Cervical: No cervical adenopathy.     Upper Body:     Right upper body: No axillary adenopathy.     Left upper body: No axillary adenopathy.  Skin:    General: Skin is warm and dry.     Findings: No rash.  Neurological:     Mental Status: She is  alert and oriented to person, place, and time.     Cranial Nerves: No cranial nerve deficit.  Psychiatric:        Behavior: Behavior normal.     Results for orders placed or performed in visit on 10/04/18  SAR CoV2 Serology (COVID 19)AB(IGG)IA  Result Value Ref Range   Abbott SARS-CoV-2 Ab, IgG Negative Negative  CBC with Differential/Platelet  Result Value Ref Range   WBC 7.9 3.4 - 10.8 x10E3/uL   RBC 4.71 3.77 - 5.28 x10E6/uL   Hemoglobin 14.2 11.1 - 15.9 g/dL   Hematocrit 42.0 34.0 - 46.6 %   MCV 89 79 - 97 fL   MCH 30.1 26.6 - 33.0 pg   MCHC 33.8 31.5 - 35.7 g/dL   RDW 12.8 11.7 - 15.4 %   Platelets 272 150 - 450 x10E3/uL   Neutrophils 62 Not Estab. %   Lymphs 29 Not Estab. %   Monocytes 7 Not Estab. %   Eos 1 Not Estab. %   Basos 1 Not Estab. %   Neutrophils Absolute 4.9 1.4 - 7.0 x10E3/uL   Lymphocytes Absolute 2.3 0.7 - 3.1 x10E3/uL   Monocytes Absolute 0.5 0.1 - 0.9 x10E3/uL   EOS (ABSOLUTE) 0.1 0.0 - 0.4 x10E3/uL   Basophils Absolute 0.1 0.0 - 0.2  x10E3/uL   Immature Granulocytes 0 Not Estab. %   Immature Grans (Abs) 0.0 0.0 - 0.1 x10E3/uL  Comprehensive metabolic panel  Result Value Ref Range   Glucose 144 (H) 65 - 99 mg/dL   BUN 14 6 - 24 mg/dL   Creatinine, Ser 0.79 0.57 - 1.00 mg/dL   GFR calc non Af Amer 83 >59 mL/min/1.73   GFR calc Af Amer 96 >59 mL/min/1.73   BUN/Creatinine Ratio 18 9 - 23   Sodium 142 134 - 144 mmol/L   Potassium 3.7 3.5 - 5.2 mmol/L   Chloride 101 96 - 106 mmol/L   CO2 25 20 - 29 mmol/L   Calcium 9.8 8.7 - 10.2 mg/dL   Total Protein 7.1 6.0 - 8.5 g/dL   Albumin 4.7 3.8 - 4.9 g/dL   Globulin, Total 2.4 1.5 - 4.5 g/dL   Albumin/Globulin Ratio 2.0 1.2 - 2.2   Bilirubin Total 0.3 0.0 - 1.2 mg/dL   Alkaline Phosphatase 85 39 - 117 IU/L   AST 129 (H) 0 - 40 IU/L   ALT 124 (H) 0 - 32 IU/L  Lipid Panel w/o Chol/HDL Ratio  Result Value Ref Range   Cholesterol, Total 205 (H) 100 - 199 mg/dL   Triglycerides 292 (H) 0 - 149 mg/dL    HDL 51 >39 mg/dL   VLDL Cholesterol Cal 58 (H) 5 - 40 mg/dL   LDL Calculated 96 0 - 99 mg/dL  UA/M w/rflx Culture, Routine   Specimen: Urine   URINE  Result Value Ref Range   Specific Gravity, UA 1.025 1.005 - 1.030   pH, UA 6.0 5.0 - 7.5   Color, UA Yellow Yellow   Appearance Ur Clear Clear   Leukocytes,UA Negative Negative   Protein,UA Negative Negative/Trace   Glucose, UA Negative Negative   Ketones, UA 1+ (A) Negative   RBC, UA Negative Negative   Bilirubin, UA Negative Negative   Urobilinogen, Ur 1.0 0.2 - 1.0 mg/dL   Nitrite, UA Negative Negative      Assessment & Plan:   Problem List Items Addressed This Visit      Cardiovascular and Mediastinum   Hypertension - Primary    BPs slightly above goal on amlodipine alone, will add 1/2 tab (50 mg) losartan back and monitor home readings closely. Call in 2 weeks with home readings to discuss long term management      Relevant Medications   amLODipine (NORVASC) 5 MG tablet   losartan (COZAAR) 100 MG tablet    Other Visit Diagnoses    Annual physical exam       Relevant Orders   CBC with Differential/Platelet (Completed)   Comprehensive metabolic panel (Completed)   Lipid Panel w/o Chol/HDL Ratio (Completed)   UA/M w/rflx Culture, Routine (Completed)   Recent URI       Requesting COVID antibody testing due to URI she had earlier this year. Await results   Relevant Orders   SAR CoV2 Serology (COVID 19)AB(IGG)IA (Completed)   Periumbilical abdominal pain       Will refer to general surgery for further evaluation. Ongoing x 2 years, worsening. CT abdomen initially w/o obvious cause identified. No obvious defect on exam   Relevant Orders   Ambulatory referral to General Surgery       Follow up plan: Return in about 6 months (around 04/06/2019) for 6 month f/u.   LABORATORY TESTING:  - Pap smear: up to date  IMMUNIZATIONS:   - Tdap: Tetanus vaccination status reviewed:  last tetanus booster within 10 years. -  Influenza: Postponed to flu season  SCREENING: -Mammogram: Refused  - Colonoscopy: Refused   PATIENT COUNSELING:   Advised to take 1 mg of folate supplement per day if capable of pregnancy.   Sexuality: Discussed sexually transmitted diseases, partner selection, use of condoms, avoidance of unintended pregnancy  and contraceptive alternatives.   Advised to avoid cigarette smoking.  I discussed with the patient that most people either abstain from alcohol or drink within safe limits (<=14/week and <=4 drinks/occasion for males, <=7/weeks and <= 3 drinks/occasion for females) and that the risk for alcohol disorders and other health effects rises proportionally with the number of drinks per week and how often a drinker exceeds daily limits.  Discussed cessation/primary prevention of drug use and availability of treatment for abuse.   Diet: Encouraged to adjust caloric intake to maintain  or achieve ideal body weight, to reduce intake of dietary saturated fat and total fat, to limit sodium intake by avoiding high sodium foods and not adding table salt, and to maintain adequate dietary potassium and calcium preferably from fresh fruits, vegetables, and low-fat dairy products.    stressed the importance of regular exercise  Injury prevention: Discussed safety belts, safety helmets, smoke detector, smoking near bedding or upholstery.   Dental health: Discussed importance of regular tooth brushing, flossing, and dental visits.    NEXT PREVENTATIVE PHYSICAL DUE IN 1 YEAR. Return in about 6 months (around 04/06/2019) for 6 month f/u.

## 2018-10-05 LAB — COMPREHENSIVE METABOLIC PANEL
ALT: 124 IU/L — ABNORMAL HIGH (ref 0–32)
AST: 129 IU/L — ABNORMAL HIGH (ref 0–40)
Albumin/Globulin Ratio: 2 (ref 1.2–2.2)
Albumin: 4.7 g/dL (ref 3.8–4.9)
Alkaline Phosphatase: 85 IU/L (ref 39–117)
BUN/Creatinine Ratio: 18 (ref 9–23)
BUN: 14 mg/dL (ref 6–24)
Bilirubin Total: 0.3 mg/dL (ref 0.0–1.2)
CO2: 25 mmol/L (ref 20–29)
Calcium: 9.8 mg/dL (ref 8.7–10.2)
Chloride: 101 mmol/L (ref 96–106)
Creatinine, Ser: 0.79 mg/dL (ref 0.57–1.00)
GFR calc Af Amer: 96 mL/min/{1.73_m2} (ref >59)
GFR calc non Af Amer: 83 mL/min/{1.73_m2} (ref >59)
Globulin, Total: 2.4 g/dL (ref 1.5–4.5)
Glucose: 144 mg/dL — ABNORMAL HIGH (ref 65–99)
Potassium: 3.7 mmol/L (ref 3.5–5.2)
Sodium: 142 mmol/L (ref 134–144)
Total Protein: 7.1 g/dL (ref 6.0–8.5)

## 2018-10-05 LAB — LIPID PANEL W/O CHOL/HDL RATIO
Cholesterol, Total: 205 mg/dL — ABNORMAL HIGH (ref 100–199)
HDL: 51 mg/dL (ref >39)
LDL Calculated: 96 mg/dL (ref 0–99)
Triglycerides: 292 mg/dL — ABNORMAL HIGH (ref 0–149)
VLDL Cholesterol Cal: 58 mg/dL — ABNORMAL HIGH (ref 5–40)

## 2018-10-05 LAB — CBC WITH DIFFERENTIAL/PLATELET
Basophils Absolute: 0.1 10*3/uL (ref 0.0–0.2)
Basos: 1 %
EOS (ABSOLUTE): 0.1 10*3/uL (ref 0.0–0.4)
Eos: 1 %
Hematocrit: 42 % (ref 34.0–46.6)
Hemoglobin: 14.2 g/dL (ref 11.1–15.9)
Immature Grans (Abs): 0 10*3/uL (ref 0.0–0.1)
Immature Granulocytes: 0 %
Lymphocytes Absolute: 2.3 10*3/uL (ref 0.7–3.1)
Lymphs: 29 %
MCH: 30.1 pg (ref 26.6–33.0)
MCHC: 33.8 g/dL (ref 31.5–35.7)
MCV: 89 fL (ref 79–97)
Monocytes Absolute: 0.5 10*3/uL (ref 0.1–0.9)
Monocytes: 7 %
Neutrophils Absolute: 4.9 10*3/uL (ref 1.4–7.0)
Neutrophils: 62 %
Platelets: 272 10*3/uL (ref 150–450)
RBC: 4.71 x10E6/uL (ref 3.77–5.28)
RDW: 12.8 % (ref 11.7–15.4)
WBC: 7.9 10*3/uL (ref 3.4–10.8)

## 2018-10-05 LAB — SAR COV2 SEROLOGY (COVID19)AB(IGG),IA: SARS-CoV-2 Ab, IgG: NEGATIVE

## 2018-10-08 ENCOUNTER — Other Ambulatory Visit: Payer: Self-pay | Admitting: Family Medicine

## 2018-10-08 DIAGNOSIS — R7989 Other specified abnormal findings of blood chemistry: Secondary | ICD-10-CM

## 2018-10-08 DIAGNOSIS — R739 Hyperglycemia, unspecified: Secondary | ICD-10-CM

## 2018-10-10 ENCOUNTER — Ambulatory Visit
Admission: RE | Admit: 2018-10-10 | Discharge: 2018-10-10 | Disposition: A | Discharge status: Home / Self Care | Payer: BC Managed Care – PPO | Source: Ambulatory Visit | Attending: Oncology | Admitting: Oncology

## 2018-10-10 ENCOUNTER — Other Ambulatory Visit: Payer: Self-pay

## 2018-10-10 DIAGNOSIS — Z122 Encounter for screening for malignant neoplasm of respiratory organs: Secondary | ICD-10-CM | POA: Diagnosis present

## 2018-10-10 DIAGNOSIS — Z87891 Personal history of nicotine dependence: Secondary | ICD-10-CM

## 2018-10-11 NOTE — Assessment & Plan Note (Signed)
BPs slightly above goal on amlodipine alone, will add 1/2 tab (50 mg) losartan back and monitor home readings closely. Call in 2 weeks with home readings to discuss long term management

## 2018-10-12 ENCOUNTER — Encounter: Payer: Self-pay | Admitting: *Deleted

## 2018-10-17 ENCOUNTER — Other Ambulatory Visit: Payer: Self-pay

## 2018-10-18 ENCOUNTER — Ambulatory Visit: Payer: BC Managed Care – PPO | Admitting: General Surgery

## 2018-10-18 ENCOUNTER — Encounter: Payer: Self-pay | Admitting: General Surgery

## 2018-10-18 ENCOUNTER — Other Ambulatory Visit: Payer: Self-pay

## 2018-10-18 VITALS — BP 135/85 | HR 90 | Temp 97.2°F | Ht 60.0 in | Wt 164.0 lb

## 2018-10-18 DIAGNOSIS — M6208 Separation of muscle (nontraumatic), other site: Secondary | ICD-10-CM

## 2018-10-18 NOTE — Patient Instructions (Addendum)
Follow-up with our office as needed.  Please call and ask to speak with a nurse if you develop questions or concerns.   [place]  Diastasis Recti  Diastasis recti is when the muscles of the abdomen (rectus abdominis muscles) become thin and separate. The result is a wider space between the right and left abdomen (abdominal) muscles. This wider space between the muscles may cause a bulge in the middle of your abdomen. You may notice this bulge when you are straining or when you sit up from a lying down position. Diastasis recti can affect men and women. It is most common among pregnant women, infants, people who are obese, and people who have had abdominal surgery. Exercise or surgical treatment may help correct it. What are the causes? Common causes of this condition include:  Pregnancy. The growing uterus puts pressure on the abdominal muscles, which causes the muscles to separate.  Obesity. Excess fat puts pressure on abdominal muscles.  Weightlifting.  Some abdomen exercises.  Advanced age.  Genetics.  Prior abdominal surgery. What increases the risk? This condition is more likely to develop in:  Women.  Newborns, especially newborns who are born early (prematurely). What are the signs or symptoms? Common symptoms of this condition include:  A bulge in the middle of the abdomen. You will notice it most when you sit up or strain.  Pain in the low back, pelvis, or hips.  Constipation.  Inability to control when you urinate (urinary incontinence).  Bloating.  Poor posture. How is this diagnosed? This condition is diagnosed with a physical exam. Your health care provider will ask you to lie flat on your back and do a crunch or half sit-up. If you have diastasis recti, a vertical bulge will appear between your abdominal muscles in the center of your abdomen. Your health care provider will measure the gap between your muscles with one of the following:  A medical  device used to measure the space between two objects (caliper).  A tape measure.  CT scan.  Ultrasound.  Finger spaces. Your health care provider will measure the space using their fingers. How is this treated? If your muscle separation is not too large, you may not need treatment. However, if you are a woman who plans to become pregnant again, you should treat this condition before your next pregnancy. Treatment may include:  Physical therapy to strengthen and tighten your abdominal muscles.  Lifestyle changes such as weight loss and exercise.  Over-the-counter pain medicines as needed.  Surgery to correct the separation. Follow these instructions at home: Activity  Return to your normal activities as told by your health care provider. Ask your health care provider what activities are safe for you.  When lifting weights or doing exercises using your abdominal muscles or the muscles in the center of your body that give stability (core muscles), make sure you are doing your exercises and movements correctly. Proper form can help to prevent the condition from happening again. General instructions  If you are overweight, ask your health care provider for help with weight loss. Losing even a small amount of weight can help to improve your diastasis recti.  Take over-the-counter or prescription medicines only as told by your health care provider.  Do not strain. Straining can make the separation worse. Examples of straining include: ? Pushing hard to have a bowel movement, such as due to constipation. ? Lifting heavy objects, including children. ? Standing up and sitting down.  Take steps  to prevent constipation: ? Drink enough fluid to keep your urine clear or pale yellow. ? Take over-the-counter or prescription medicines only as directed. ? Eat foods that are high in fiber, such as fresh fruits and vegetables, whole grains, and beans. ? Limit foods that are high in fat and  processed sugars, such as fried and sweet foods. Contact a health care provider if:  You notice a new bulge in your abdomen. Get help right away if:  You experience severe discomfort in your abdomen.  You develop severe abdominal pain along with nausea, vomiting, or fever. Summary  Diastasis recti is when the abdomen (abdominal) muscles become thin and separate. Your abdomen will stick out because the space between your right and left abdomen muscles has widened.  The most common symptom is a bulge in your abdomen. You will notice it most when you sit up or are straining.  This condition is diagnosed during a physical exam.  If the abdomen separation is not too big, you may choose not to have treatment. Otherwise, you may need to undergo physical therapy or surgery. This information is not intended to replace advice given to you by your health care provider. Make sure you discuss any questions you have with your health care provider. Document Released: 04/04/2016 Document Revised: 01/20/2017 Document Reviewed: 04/04/2016 Elsevier Patient Education  2020 [employer].

## 2018-10-18 NOTE — Progress Notes (Signed)
Patient ID: Deanna Velez, female   DOB: 04/13/1960, 58 y.o.   MRN: [id number]  Chief Complaint  Patient presents with  . New Patient (Initial Visit)    Periumbilic pain    HPI Deanna Velez is a 58 y.o. female. She was referred by her primary care provider, Merrie Roof, PA-C, to evaluate whether or not she might have a hernia.  Apparently she has had periumbilical abdominal pain for at least 2 years.  A CT scan performed in November 2018 did not reveal an etiology for this.  Specifically, no hernia was identified.  Deanna Velez states that she feels a bulge just above her bellybutton.  She says she can push it back in but it comes back out with any activity.  She also notices it if she has eaten a large meal or is bloated.  She states that it is rarely painful, mainly uncomfortable with a slight ache.  She has never had abdominal surgery.  She denies any constipation.  She has never had nausea and vomiting associated with an increase in the size of the bulge.  She gives no symptoms suggestive of incarceration or strangulation.   Past Medical History:  Diagnosis Date  . Emphysema of lung ([place])   . Gallbladder attack   . Hypertension   . Lack of libido   . PMB (postmenopausal bleeding)   . Ringworm   . Vitamin D deficiency disease     Past Surgical History:  Procedure Laterality Date  . TOE SURGERY Right 05/2014  . TONSILLECTOMY    . TUBAL LIGATION      Family History  Problem Relation Age of Onset  . Hyperlipidemia Mother   . Diabetes Mother   . Heart disease Mother   . Cancer Sister        leukemia  . Diabetes Sister   . Diabetes Maternal Grandmother   . Hyperlipidemia Sister   . Diabetes Sister   . Stroke Sister   . Breast cancer Neg Hx     Social History Social History   Tobacco Use  . Smoking status: Current Every Day Smoker    Packs/day: 0.75    Years: 36.00    Pack years: 27.00    Types: Cigarettes  . Smokeless tobacco: Never Used  . Tobacco comment: Patient quit  12/2016, but started again-2019,  Substance Use Topics  . Alcohol use: Yes    Alcohol/week: 0.0 standard drinks    Comment: beer on the weekends  . Drug use: No    Allergies  Allergen Reactions  . 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 Beef-Derived Products   . Pork-Derived Products     Current Outpatient Medications  Medication Sig Dispense Refill  . amLODipine (NORVASC) 5 MG tablet Take 1 tablet (5 mg total) by mouth daily. 90 tablet 1  . EPINEPHrine (EPIPEN IJ) Inject as directed.    Marland Kitchen ibuprofen (ADVIL,MOTRIN) 800 MG tablet Take 1 tablet (800 mg total) by mouth every 8 (eight) hours as needed. 90 tablet 2  . losartan (COZAAR) 100 MG tablet Take 1 tablet (100 mg total) by mouth daily. 90 tablet 0  . Multiple Vitamins-Minerals (MULTIVITAMIN ADULT PO) Take by mouth daily.    . nicotine (NICOTINE STEP 1) 21 mg/24hr patch Place 1 patch (21 mg total) onto the skin daily. 28 patch 0  . POTASSIUM PO Take 99 mg daily by mouth.     . vitamin C (ASCORBIC ACID) 500  MG tablet Take 500 mg by mouth daily.    Marland Kitchen VITAMIN D PO Take by mouth.     No current facility-administered medications for this visit.     Review of Systems Review of Systems  All other systems reviewed and are negative.   Blood pressure 135/85, pulse 90, temperature (!) 97.2 F (36.2 C), height 5' (1.524 m), weight 164 lb (74.4 kg), SpO2 94 %.  Physical Exam Physical Exam Vitals signs reviewed.  Constitutional:      General: She is not in acute distress.    Appearance: Normal appearance. She is obese.  HENT:     Head: Normocephalic and atraumatic.     Nose:     Comments: Covered with a mask secondary to COVID-19 precautions    Mouth/Throat:     Comments: Covered with a mask secondary to COVID-19 precautions Eyes:     General: No scleral icterus.       Right eye: No discharge.        Left eye: No discharge.  Neck:     Musculoskeletal: Normal range of motion.     Comments:  No thyromegaly or dominant thyroid masses appreciated. Cardiovascular:     Rate and Rhythm: Normal rate and regular rhythm.     Pulses: Normal pulses.  Pulmonary:     Effort: Pulmonary effort is normal.     Breath sounds: Wheezing present.     Comments: Expiratory wheezes present. Abdominal:     General: Bowel sounds are normal.     Palpations: Abdomen is soft.       Comments: With Valsalva, there is a bulge in the area indicated.  There is no fascial defect identified.  No umbilical hernia is appreciated on exam.  Genitourinary:    Comments: Deferred Musculoskeletal:        General: No swelling or deformity.  Lymphadenopathy:     Cervical: No cervical adenopathy.  Skin:    General: Skin is warm and dry.  Neurological:     General: No focal deficit present.     Mental Status: She is alert and oriented to person, place, and time.  Psychiatric:        Mood and Affect: Mood normal.        Behavior: Behavior normal.     Data Reviewed I reviewed the report and personally reviewed the films of the CT scan performed in November 2018.  Specifically, there is no hernia in the epigastric area.  Assessment and plan: This is a 58 year old woman who complains of a bulge just cranial to her umbilicus.  I do not appreciate a hernia on exam.  No hernia defect was seen on her prior CT scan, either.  I think the most likely diagnosis is diastasis rectus.  I spent approximately 50% of our 45-minute visit discussing this diagnosis and what it means.  Deanna Velez was reassured that this is not a life-threatening entity and that it is unlikely to ever become so.  She did inquire as to whether or not surgery was indicated.  I told her that there was an operative option available, however it was not a recommended procedure in most cases.  We provided Deanna Velez with a handout regarding this diagnosis.  She was greatly reassured that this was unlikely to ever become an emergent problem for her.  She was  counseled that should it become increasingly symptomatic, to the point where she felt like she wanted surgical repair, she was welcome to return and  we could discuss it further, however at this time it would be a purely cosmetic operation.  She will contact us on an as-needed basis, otherwise she should follow-up with her primary care providers.     Fredirick Maudlin 10/18/2018, 3:17 PM

## 2018-10-26 ENCOUNTER — Ambulatory Visit (INDEPENDENT_AMBULATORY_CARE_PROVIDER_SITE_OTHER): Payer: BC Managed Care – PPO

## 2018-10-26 ENCOUNTER — Other Ambulatory Visit: Payer: Self-pay

## 2018-10-26 ENCOUNTER — Other Ambulatory Visit (INDEPENDENT_AMBULATORY_CARE_PROVIDER_SITE_OTHER): Payer: BC Managed Care – PPO

## 2018-10-26 DIAGNOSIS — Z23 Encounter for immunization: Secondary | ICD-10-CM | POA: Diagnosis not present

## 2018-10-26 DIAGNOSIS — R739 Hyperglycemia, unspecified: Secondary | ICD-10-CM

## 2018-10-26 DIAGNOSIS — R7989 Other specified abnormal findings of blood chemistry: Secondary | ICD-10-CM

## 2018-10-27 LAB — COMPREHENSIVE METABOLIC PANEL
ALT: 259 IU/L — ABNORMAL HIGH (ref 0–32)
AST: 308 IU/L — ABNORMAL HIGH (ref 0–40)
Albumin/Globulin Ratio: 2.2 (ref 1.2–2.2)
Albumin: 4.9 g/dL (ref 3.8–4.9)
Alkaline Phosphatase: 88 IU/L (ref 39–117)
BUN/Creatinine Ratio: 9 (ref 9–23)
BUN: 7 mg/dL (ref 6–24)
Bilirubin Total: 0.6 mg/dL (ref 0.0–1.2)
CO2: 21 mmol/L (ref 20–29)
Calcium: 10.2 mg/dL (ref 8.7–10.2)
Chloride: 100 mmol/L (ref 96–106)
Creatinine, Ser: 0.77 mg/dL (ref 0.57–1.00)
GFR calc Af Amer: 99 mL/min/{1.73_m2} (ref >59)
GFR calc non Af Amer: 86 mL/min/{1.73_m2} (ref >59)
Globulin, Total: 2.2 g/dL (ref 1.5–4.5)
Glucose: 94 mg/dL (ref 65–99)
Potassium: 4.4 mmol/L (ref 3.5–5.2)
Sodium: 137 mmol/L (ref 134–144)
Total Protein: 7.1 g/dL (ref 6.0–8.5)

## 2018-10-27 LAB — HEPATITIS PANEL, ACUTE
Hep A IgM: NEGATIVE
Hep B C IgM: NEGATIVE
Hep C Virus Ab: <0.1 s/co ratio (ref 0.0–0.9)
Hepatitis B Surface Ag: NEGATIVE

## 2018-10-27 LAB — HEMOGLOBIN A1C
Est. average glucose Bld gHb Est-mCnc: 111 mg/dL
Hgb A1c MFr Bld: 5.5 % (ref 4.8–5.6)

## 2018-10-31 ENCOUNTER — Telehealth: Payer: Self-pay | Admitting: Family Medicine

## 2018-10-31 DIAGNOSIS — R7989 Other specified abnormal findings of blood chemistry: Secondary | ICD-10-CM

## 2018-10-31 NOTE — Telephone Encounter (Signed)
Called patient, discussed worsening LFTs and neg hepatitis panel. Will obtain u/s of the RUQ and discussed avoiding alcohol. She does report she's been drinking 3-4 beers nightly since quarantine started

## 2018-11-09 ENCOUNTER — Other Ambulatory Visit: Payer: Self-pay

## 2018-11-09 ENCOUNTER — Ambulatory Visit
Admission: RE | Admit: 2018-11-09 | Discharge: 2018-11-09 | Disposition: A | Discharge status: Home / Self Care | Payer: BC Managed Care – PPO | Source: Ambulatory Visit | Attending: Family Medicine | Admitting: Family Medicine

## 2018-11-09 ENCOUNTER — Encounter: Payer: Self-pay | Admitting: Family Medicine

## 2018-11-09 DIAGNOSIS — R945 Abnormal results of liver function studies: Secondary | ICD-10-CM | POA: Insufficient documentation

## 2018-11-09 DIAGNOSIS — R7989 Other specified abnormal findings of blood chemistry: Secondary | ICD-10-CM

## 2018-12-27 ENCOUNTER — Other Ambulatory Visit: Payer: Self-pay | Admitting: Family Medicine

## 2019-01-25 ENCOUNTER — Ambulatory Visit (INDEPENDENT_AMBULATORY_CARE_PROVIDER_SITE_OTHER): Payer: BC Managed Care – PPO | Admitting: Family Medicine

## 2019-01-25 ENCOUNTER — Encounter: Payer: Self-pay | Admitting: Family Medicine

## 2019-01-25 ENCOUNTER — Other Ambulatory Visit: Payer: Self-pay

## 2019-01-25 ENCOUNTER — Ambulatory Visit: Payer: BC Managed Care – PPO | Admitting: Family Medicine

## 2019-01-25 VITALS — BP 117/81 | HR 83 | Temp 98.7°F | Ht 60.0 in | Wt 168.0 lb

## 2019-01-25 DIAGNOSIS — F17210 Nicotine dependence, cigarettes, uncomplicated: Secondary | ICD-10-CM | POA: Diagnosis not present

## 2019-01-25 DIAGNOSIS — J439 Emphysema, unspecified: Secondary | ICD-10-CM | POA: Diagnosis not present

## 2019-01-25 DIAGNOSIS — I10 Essential (primary) hypertension: Secondary | ICD-10-CM

## 2019-01-25 DIAGNOSIS — R21 Rash and other nonspecific skin eruption: Secondary | ICD-10-CM

## 2019-01-25 MED ORDER — LOSARTAN POTASSIUM 50 MG PO TABS: 50.0000 mg | ORAL_TABLET | Freq: Every day | ORAL | 1 refills | Status: DC

## 2019-01-25 MED ORDER — KETOCONAZOLE 2 % EX CREA: 1.0000 "application " | TOPICAL_CREAM | Freq: Every day | CUTANEOUS | 0 refills | Status: DC

## 2019-01-25 MED ORDER — TRIAMCINOLONE ACETONIDE 0.1 % EX CREA: 1.0000 "application " | TOPICAL_CREAM | Freq: Two times a day (BID) | CUTANEOUS | 0 refills | Status: DC

## 2019-01-25 MED ORDER — ANORO ELLIPTA 62.5-25 MCG/INH IN AEPB: 1.0000 | INHALATION_SPRAY | Freq: Every day | RESPIRATORY_TRACT | 2 refills | Status: DC

## 2019-01-25 NOTE — Progress Notes (Signed)
BP 117/81   Pulse 83   Temp 98.7 F (37.1 C) (Oral)   Ht 5' (1.524 m)   Wt 168 lb (76.2 kg)   LMP  (LMP Unknown)   SpO2 96%   BMI 32.81 kg/m    Subjective:    Patient ID: Deanna Velez, female    DOB: 20-Oct-1960, 58 y.o.   MRN: [id number]  HPI: Deanna Velez is a 58 y.o. female  Chief Complaint  Patient presents with  . Rash    small red spot on left wrist since Monday   Stopped smoking 2 months ago finally, very excited to be off cigarettes after many times trying. Since quitting notes her chronic cough has seemed to worsen. Denies wheezing, SOB, chest tightness, CP. Does have known emphysema not on inhalers as she hasn't had significant consistent issue previously. Has been trying some old lotrisone cream for it which hasn't seemed to help.   Left hand rash for 5 days, itching and mildly flaky. Hx of ringworm but states this doesn't act like that. No known insect bites, new exposures, fevers, chills, drainage.   Taking 1/2 tab of losartan (50 mg) and the amlopine which seems to be doing very well for her. Full tab seemed too strong.   Relevant past medical, surgical, family and social history reviewed and updated as indicated. Interim medical history since our last visit reviewed. Allergies and medications reviewed and updated.  Review of Systems  Per HPI unless specifically indicated above     Objective:    BP 117/81   Pulse 83   Temp 98.7 F (37.1 C) (Oral)   Ht 5' (1.524 m)   Wt 168 lb (76.2 kg)   LMP  (LMP Unknown)   SpO2 96%   BMI 32.81 kg/m   Wt Readings from Last 3 Encounters:  01/25/19 168 lb (76.2 kg)  10/18/18 164 lb (74.4 kg)  10/10/18 164 lb (74.4 kg)    Physical Exam Vitals signs and nursing note reviewed.  Constitutional:      Appearance: Normal appearance. She is not ill-appearing.  HENT:     Head: Atraumatic.  Eyes:     Extraocular Movements: Extraocular movements intact.     Conjunctiva/sclera: Conjunctivae normal.  Neck:   Musculoskeletal: Normal range of motion and neck supple.  Cardiovascular:     Rate and Rhythm: Normal rate and regular rhythm.     Heart sounds: Normal heart sounds.  Pulmonary:     Effort: Pulmonary effort is normal.     Breath sounds: Normal breath sounds.  Musculoskeletal: Normal range of motion.  Skin:    General: Skin is warm and dry.     Findings: Rash (annular nonraised lesion with central clearing. no drainage, surrounding erythema, warmth, edema) present.  Neurological:     Mental Status: She is alert and oriented to person, place, and time.  Psychiatric:        Mood and Affect: Mood normal.        Thought Content: Thought content normal.        Judgment: Judgment normal.     Results for orders placed or performed in visit on 10/26/18  Hepatitis panel, acute  Result Value Ref Range   Hep A IgM Negative Negative   Hepatitis B Surface Ag Negative Negative   Hep B C IgM Negative Negative   Hep C Virus Ab <0.1 0.0 - 0.9 s/co ratio  HgB A1c  Result Value Ref Range   Hgb A1c MFr Bld  5.5 4.8 - 5.6 %   Est. average glucose Bld gHb Est-mCnc 111 mg/dL  Comprehensive metabolic panel  Result Value Ref Range   Glucose 94 65 - 99 mg/dL   BUN 7 6 - 24 mg/dL   Creatinine, Ser 0.77 0.57 - 1.00 mg/dL   GFR calc non Af Amer 86 >59 mL/min/1.73   GFR calc Af Amer 99 >59 mL/min/1.73   BUN/Creatinine Ratio 9 9 - 23   Sodium 137 134 - 144 mmol/L   Potassium 4.4 3.5 - 5.2 mmol/L   Chloride 100 96 - 106 mmol/L   CO2 21 20 - 29 mmol/L   Calcium 10.2 8.7 - 10.2 mg/dL   Total Protein 7.1 6.0 - 8.5 g/dL   Albumin 4.9 3.8 - 4.9 g/dL   Globulin, Total 2.2 1.5 - 4.5 g/dL   Albumin/Globulin Ratio 2.2 1.2 - 2.2   Bilirubin Total 0.6 0.0 - 1.2 mg/dL   Alkaline Phosphatase 88 39 - 117 IU/L   AST 308 (H) 0 - 40 IU/L   ALT 259 (H) 0 - 32 IU/L      Assessment & Plan:   Problem List Items Addressed This Visit      Cardiovascular and Mediastinum   Hypertension - Primary    Will send 50 mg  tab so she does not have to keep cutting the higher dose in half. BPs stable and WNL, continue current regimen      Relevant Medications   losartan (COZAAR) 50 MG tablet     Respiratory   Emphysema, unspecified ([facility])    Will trial anoro inhaler to see if this helps with worsening cough. Discussed this will likely improve after this initial period of cessation. Can take OTC cough suppressants prn      Relevant Medications   umeclidinium-vilanterol (ANORO ELLIPTA) 62.5-25 MCG/INH AEPB     Other   Cigarette smoker    Congratulated success, encouraged continued cessation       Other Visit Diagnoses    Rash       Unclear etiology. Trial triamcinolone cream, moisturizers. If no benefit, can try plain ketoconazole cream though does not appear classically for fungal       Follow up plan: Return in about 2 months (around 03/28/2019) for 6 month f/u.

## 2019-01-30 NOTE — Assessment & Plan Note (Signed)
Will trial anoro inhaler to see if this helps with worsening cough. Discussed this will likely improve after this initial period of cessation. Can take OTC cough suppressants prn

## 2019-01-30 NOTE — Assessment & Plan Note (Signed)
Congratulated success, encouraged continued cessation

## 2019-01-30 NOTE — Assessment & Plan Note (Signed)
Will send 50 mg tab so she does not have to keep cutting the higher dose in half. BPs stable and WNL, continue current regimen

## 2019-02-01 ENCOUNTER — Telehealth: Payer: Self-pay

## 2019-02-01 NOTE — Telephone Encounter (Signed)
Verbal from Apolonio Schneiders that she is fine to work, patient notified.

## 2019-02-01 NOTE — Telephone Encounter (Signed)
Needs a response as soon as possible.  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 She states that she thinks that their is a way that they can transfer phone calls to her at home.  Patient would like your medical opinion.  Copied from [place] [id number]. Topic: General - Other >> Jan 31, 2019  4:31 PM Oneta Rack wrote: Patient returning call and states its personal and would like a follow up call

## 2019-03-01 ENCOUNTER — Ambulatory Visit (INDEPENDENT_AMBULATORY_CARE_PROVIDER_SITE_OTHER): Payer: BC Managed Care – PPO | Admitting: Family Medicine

## 2019-03-01 ENCOUNTER — Other Ambulatory Visit: Payer: Self-pay

## 2019-03-01 ENCOUNTER — Encounter: Payer: Self-pay | Admitting: Family Medicine

## 2019-03-01 VITALS — BP 119/69 | HR 82 | Temp 98.2°F | Ht 60.0 in | Wt 169.0 lb

## 2019-03-01 DIAGNOSIS — J439 Emphysema, unspecified: Secondary | ICD-10-CM

## 2019-03-01 NOTE — Progress Notes (Signed)
BP 119/69   Pulse 82   Temp 98.2 F (36.8 C) (Oral)   Ht 5' (1.524 m)   Wt 169 lb (76.7 kg)   LMP  (LMP Unknown)   BMI 33.01 kg/m    Subjective:    Patient ID: Deanna Velez, female    DOB: 1960/10/11, 59 y.o.   MRN: [id number]  HPI: Deanna Velez is a 59 y.o. female  Chief Complaint  Patient presents with  . Paperwork    patient is requesting FMLA for work. pt stated that she is at high risk for covid 19 because of her cough and breathing problems    . This visit was completed via WebEx due to the restrictions of the COVID-19 pandemic. All issues as above were discussed and addressed. Physical exam was done as above through visual confirmation on WebEx. If it was felt that the patient should be evaluated in the office, they were directed there. The patient verbally consented to this visit. . Location of the patient: home . Location of the provider: home . Those involved with this call:  . Provider: Merrie Roof, PA-C . CMA: Lesle Chris, [place] . Front Desk/Registration: Jill Side  . Time spent on call: 15 minutes with patient face to face via video conference. More than 50% of this time was spent in counseling and coordination of care. 5 minutes total spent in review of patient's record and preparation of their chart. I verified patient identity using two factors (patient name and date of birth). Patient consents verbally to being seen via telemedicine visit today.   Patient presenting today to discuss request for [facility] paperwork. Very worried about COVID because of her risk factors and her husband's risk factors. 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 Her job does not offer a virtual option, only in person work so she would like to take 12 weeks off and hopefully give herself some time to get vaccinated and figure out a plan that allows her to work more safely. Open to returning to work sooner if able to work virtually. Requesting  January 19th to April 13th and needing forms faxed to [facility].  Relevant past medical, surgical, family and social history reviewed and updated as indicated. Interim medical history since our last visit reviewed. Allergies and medications reviewed and updated.  Review of Systems  Per HPI unless specifically indicated above     Objective:    BP 119/69   Pulse 82   Temp 98.2 F (36.8 C) (Oral)   Ht 5' (1.524 m)   Wt 169 lb (76.7 kg)   LMP  (LMP Unknown)   BMI 33.01 kg/m   Wt Readings from Last 3 Encounters:  03/01/19 169 lb (76.7 kg)  01/25/19 168 lb (76.2 kg)  10/18/18 164 lb (74.4 kg)    Physical Exam Vitals and nursing note reviewed.  Constitutional:      General: She is not in acute distress.    Appearance: Normal appearance.  HENT:     Head: Atraumatic.     Right Ear: External ear normal.     Left Ear: External ear normal.     Nose: Nose normal. No congestion.     Mouth/Throat:     Mouth: Mucous membranes are moist.     Pharynx: Oropharynx is clear. No posterior oropharyngeal erythema.  Eyes:     Extraocular Movements: Extraocular movements intact.     Conjunctiva/sclera: Conjunctivae normal.  Cardiovascular:  Comments: Unable to assess via virtual visit Pulmonary:     Effort: Pulmonary effort is normal. No respiratory distress.  Musculoskeletal:        General: Normal range of motion.     Cervical back: Normal range of motion.  Skin:    General: Skin is dry.     Findings: No erythema.  Neurological:     Mental Status: She is alert and oriented to person, place, and time.  Psychiatric:        Mood and Affect: Mood normal.        Thought Content: Thought content normal.        Judgment: Judgment normal.     Results for orders placed or performed in visit on 10/26/18  Hepatitis panel, acute  Result Value Ref Range   Hep A IgM Negative Negative   Hepatitis B Surface Ag Negative Negative   Hep B C IgM Negative Negative   Hep C Virus Ab <0.1  0.0 - 0.9 s/co ratio  HgB A1c  Result Value Ref Range   Hgb A1c MFr Bld 5.5 4.8 - 5.6 %   Est. average glucose Bld gHb Est-mCnc 111 mg/dL  Comprehensive metabolic panel  Result Value Ref Range   Glucose 94 65 - 99 mg/dL   BUN 7 6 - 24 mg/dL   Creatinine, Ser 0.77 0.57 - 1.00 mg/dL   GFR calc non Af Amer 86 >59 mL/min/1.73   GFR calc Af Amer 99 >59 mL/min/1.73   BUN/Creatinine Ratio 9 9 - 23   Sodium 137 134 - 144 mmol/L   Potassium 4.4 3.5 - 5.2 mmol/L   Chloride 100 96 - 106 mmol/L   CO2 21 20 - 29 mmol/L   Calcium 10.2 8.7 - 10.2 mg/dL   Total Protein 7.1 6.0 - 8.5 g/dL   Albumin 4.9 3.8 - 4.9 g/dL   Globulin, Total 2.2 1.5 - 4.5 g/dL   Albumin/Globulin Ratio 2.2 1.2 - 2.2   Bilirubin Total 0.6 0.0 - 1.2 mg/dL   Alkaline Phosphatase 88 39 - 117 IU/L   AST 308 (H) 0 - 40 IU/L   ALT 259 (H) 0 - 32 IU/L      Assessment & Plan:   Problem List Items Addressed This Visit      Respiratory   Emphysema, unspecified ([place]) - Primary    Will complete [employer] forms as requested due to safety concerns and inability to perform her job duties virtually          Follow up plan: Return for as scheduled.

## 2019-03-04 NOTE — Assessment & Plan Note (Signed)
Will complete [employer] forms as requested due to safety concerns and inability to perform her job duties virtually

## 2019-03-28 ENCOUNTER — Other Ambulatory Visit: Payer: Self-pay | Admitting: Family Medicine

## 2019-03-29 ENCOUNTER — Ambulatory Visit: Payer: Self-pay | Admitting: *Deleted

## 2019-03-29 NOTE — Telephone Encounter (Signed)
Patient has questions regarding covid exposure and when she needs to get tested.    Pt stated that she was exposed to someone on Monday, who had been exposed to someone who tested positive for covid. She is advised to wait until that person who she was in contact with get her result.  And if the patient (Deanna Velez)  is not having symptoms, she should wait about 4 days before getting her test. If the patient starts having symptoms, she should go ahead and get tested. She voiced understanding. Routing to [facility] for review.  Reason for Disposition . Health Information question, no triage required and triager able to answer question  Answer Assessment - Initial Assessment Questions 1. REASON FOR CALL or QUESTION: "What is your reason for calling today?" or "How can I best help you?" or "What question do you have that I can help answer?"     covid testing  Protocols used: INFORMATION ONLY CALL-A-AH

## 2019-05-29 ENCOUNTER — Ambulatory Visit: Payer: BC Managed Care – PPO | Admitting: Family Medicine

## 2019-06-12 ENCOUNTER — Ambulatory Visit: Payer: BC Managed Care – PPO | Admitting: Family Medicine

## 2019-06-26 ENCOUNTER — Ambulatory Visit: Payer: BC Managed Care – PPO | Admitting: Family Medicine

## 2019-07-05 ENCOUNTER — Ambulatory Visit: Payer: BC Managed Care – PPO | Admitting: Family Medicine

## 2019-07-05 ENCOUNTER — Encounter: Payer: Self-pay | Admitting: Family Medicine

## 2019-07-05 ENCOUNTER — Other Ambulatory Visit: Payer: Self-pay

## 2019-07-05 VITALS — BP 115/70 | HR 83 | Temp 98.9°F | Ht 60.0 in | Wt 179.1 lb

## 2019-07-05 DIAGNOSIS — J309 Allergic rhinitis, unspecified: Secondary | ICD-10-CM | POA: Diagnosis not present

## 2019-07-05 DIAGNOSIS — I10 Essential (primary) hypertension: Secondary | ICD-10-CM

## 2019-07-05 DIAGNOSIS — J439 Emphysema, unspecified: Secondary | ICD-10-CM

## 2019-07-05 DIAGNOSIS — Z1231 Encounter for screening mammogram for malignant neoplasm of breast: Secondary | ICD-10-CM

## 2019-07-05 MED ORDER — AMLODIPINE BESYLATE 5 MG PO TABS: 5.0000 mg | ORAL_TABLET | Freq: Every day | ORAL | 1 refills | Status: DC

## 2019-07-05 MED ORDER — EPINEPHRINE 0.3 MG/0.3ML IJ SOAJ: 0.3000 mg | INTRAMUSCULAR | 1 refills | Status: AC | PRN

## 2019-07-05 MED ORDER — LOSARTAN POTASSIUM 50 MG PO TABS: 50.0000 mg | ORAL_TABLET | Freq: Every day | ORAL | 1 refills | Status: DC

## 2019-07-05 NOTE — Progress Notes (Signed)
BP 115/70   Pulse 83   Temp 98.9 F (37.2 C)   Ht 5' (1.524 m)   Wt 179 lb 2 oz (81.3 kg)   LMP  (LMP Unknown)   SpO2 97%   BMI 34.98 kg/m    Subjective:    Patient ID: Deanna Velez, female    DOB: Dec 05, 1960, 59 y.o.   MRN: [id number]  HPI: Deanna Velez is a 59 y.o. female  Chief Complaint  Patient presents with  . Hypertension    needs refills  . Medication Refill    Epi-pen   Here today for HTN f/u. Not checking home BPs. Taking medications faithfully without side effects. Denies CP, SOB, HAs, dizziness.   Needs refill on her epi pens as they've expired. Has not needed to use one the past year, carefully avoids triggers.   Has continued to be off cigarettes, very proud of the accomplishment of quitting and notes she's not even having cravings. Has not needed anoro, feeling her breathing is doing well.   Relevant past medical, surgical, family and social history reviewed and updated as indicated. Interim medical history since our last visit reviewed. Allergies and medications reviewed and updated.  Review of Systems  Per HPI unless specifically indicated above     Objective:    BP 115/70   Pulse 83   Temp 98.9 F (37.2 C)   Ht 5' (1.524 m)   Wt 179 lb 2 oz (81.3 kg)   LMP  (LMP Unknown)   SpO2 97%   BMI 34.98 kg/m   Wt Readings from Last 3 Encounters:  07/05/19 179 lb 2 oz (81.3 kg)  03/01/19 169 lb (76.7 kg)  01/25/19 168 lb (76.2 kg)    Physical Exam Vitals and nursing note reviewed.  Constitutional:      Appearance: Normal appearance. She is not ill-appearing.  HENT:     Head: Atraumatic.  Eyes:     Extraocular Movements: Extraocular movements intact.     Conjunctiva/sclera: Conjunctivae normal.  Cardiovascular:     Rate and Rhythm: Normal rate and regular rhythm.     Heart sounds: Normal heart sounds.  Pulmonary:     Effort: Pulmonary effort is normal.     Breath sounds: Normal breath sounds.  Musculoskeletal:        General: Normal range of  motion.     Cervical back: Normal range of motion and neck supple.  Skin:    General: Skin is warm and dry.  Neurological:     Mental Status: She is alert and oriented to person, place, and time.  Psychiatric:        Mood and Affect: Mood normal.        Thought Content: Thought content normal.        Judgment: Judgment normal.     Results for orders placed or performed in visit on 07/05/19  Comprehensive metabolic panel  Result Value Ref Range   Glucose 98 65 - 99 mg/dL   BUN 13 6 - 24 mg/dL   Creatinine, Ser 0.76 0.57 - 1.00 mg/dL   GFR calc non Af Amer 87 >59 mL/min/1.73   GFR calc Af Amer 101 >59 mL/min/1.73   BUN/Creatinine Ratio 17 9 - 23   Sodium 138 134 - 144 mmol/L   Potassium 4.4 3.5 - 5.2 mmol/L   Chloride 101 96 - 106 mmol/L   CO2 20 20 - 29 mmol/L   Calcium 10.1 8.7 - 10.2 mg/dL   Total Protein  7.4 6.0 - 8.5 g/dL   Albumin 4.8 3.8 - 4.9 g/dL   Globulin, Total 2.6 1.5 - 4.5 g/dL   Albumin/Globulin Ratio 1.8 1.2 - 2.2   Bilirubin Total 0.4 0.0 - 1.2 mg/dL   Alkaline Phosphatase 125 (H) 39 - 117 IU/L   AST 173 (H) 0 - 40 IU/L   ALT 165 (H) 0 - 32 IU/L      Assessment & Plan:   Problem List Items Addressed This Visit      Cardiovascular and Mediastinum   Hypertension - Primary    Stable and under good control, continue current regimen      Relevant Medications   amLODipine (NORVASC) 5 MG tablet   losartan (COZAAR) 50 MG tablet   EPINEPHrine (EPIPEN 2-PAK) 0.3 mg/0.3 mL IJ SOAJ injection   Other Relevant Orders   Comprehensive metabolic panel (Completed)     Respiratory   Allergic rhinitis    Stable and under good control, epi pens refilled for emergency situations. Avoid triggers      Emphysema, unspecified ([place])    Stable off inhaler, improved sxs since stopped smoking last year. Continue to monitor, restart if ever needed       Other Visit Diagnoses    Encounter for screening mammogram for malignant neoplasm of breast       Relevant Orders    MM Digital Diagnostic Bilat       Follow up plan: Return in about 6 months (around 01/05/2020) for CPE.

## 2019-07-06 LAB — COMPREHENSIVE METABOLIC PANEL
ALT: 165 IU/L — ABNORMAL HIGH (ref 0–32)
AST: 173 IU/L — ABNORMAL HIGH (ref 0–40)
Albumin/Globulin Ratio: 1.8 (ref 1.2–2.2)
Albumin: 4.8 g/dL (ref 3.8–4.9)
Alkaline Phosphatase: 125 IU/L — ABNORMAL HIGH (ref 39–117)
BUN/Creatinine Ratio: 17 (ref 9–23)
BUN: 13 mg/dL (ref 6–24)
Bilirubin Total: 0.4 mg/dL (ref 0.0–1.2)
CO2: 20 mmol/L (ref 20–29)
Calcium: 10.1 mg/dL (ref 8.7–10.2)
Chloride: 101 mmol/L (ref 96–106)
Creatinine, Ser: 0.76 mg/dL (ref 0.57–1.00)
GFR calc Af Amer: 101 mL/min/{1.73_m2} (ref >59)
GFR calc non Af Amer: 87 mL/min/{1.73_m2} (ref >59)
Globulin, Total: 2.6 g/dL (ref 1.5–4.5)
Glucose: 98 mg/dL (ref 65–99)
Potassium: 4.4 mmol/L (ref 3.5–5.2)
Sodium: 138 mmol/L (ref 134–144)
Total Protein: 7.4 g/dL (ref 6.0–8.5)

## 2019-07-08 NOTE — Assessment & Plan Note (Signed)
Stable and under good control, epi pens refilled for emergency situations. Avoid triggers

## 2019-07-08 NOTE — Assessment & Plan Note (Signed)
Stable off inhaler, improved sxs since stopped smoking last year. Continue to monitor, restart if ever needed

## 2019-07-08 NOTE — Assessment & Plan Note (Signed)
Stable and under good control, continue current regimen 

## 2019-07-09 ENCOUNTER — Encounter: Payer: Self-pay | Admitting: Family Medicine

## 2019-07-09 ENCOUNTER — Other Ambulatory Visit: Payer: Self-pay | Admitting: Family Medicine

## 2019-07-09 DIAGNOSIS — K760 Fatty (change of) liver, not elsewhere classified: Secondary | ICD-10-CM | POA: Insufficient documentation

## 2019-07-09 DIAGNOSIS — Z1231 Encounter for screening mammogram for malignant neoplasm of breast: Secondary | ICD-10-CM

## 2019-07-11 ENCOUNTER — Ambulatory Visit
Admission: RE | Admit: 2019-07-11 | Discharge: 2019-07-11 | Disposition: A | Discharge status: Home / Self Care | Payer: BC Managed Care – PPO | Source: Ambulatory Visit | Attending: Family Medicine | Admitting: Family Medicine

## 2019-07-11 DIAGNOSIS — Z1231 Encounter for screening mammogram for malignant neoplasm of breast: Secondary | ICD-10-CM | POA: Diagnosis present

## 2019-09-30 ENCOUNTER — Other Ambulatory Visit: Payer: Self-pay | Admitting: *Deleted

## 2019-09-30 ENCOUNTER — Telehealth: Payer: Self-pay | Admitting: *Deleted

## 2019-09-30 DIAGNOSIS — Z122 Encounter for screening for malignant neoplasm of respiratory organs: Secondary | ICD-10-CM

## 2019-09-30 DIAGNOSIS — Z87891 Personal history of nicotine dependence: Secondary | ICD-10-CM

## 2019-09-30 NOTE — Telephone Encounter (Signed)
Writer phoned patient on this date to discuss annual lung cancer screening CT scan. Patient stated that she has not smoked in 10 months and was smoking 1 ppd at the time. Patient report no health issues and that she was not under any cancer treatment. Patient did state that her insurance has changed to [employer] retired. CT was scheduled for 10-17-19 at 9:30. Patient is aware of appt.

## 2019-10-17 ENCOUNTER — Other Ambulatory Visit: Payer: Self-pay

## 2019-10-17 ENCOUNTER — Ambulatory Visit
Admission: RE | Admit: 2019-10-17 | Discharge: 2019-10-17 | Disposition: A | Discharge status: Home / Self Care | Payer: BC Managed Care – PPO | Source: Ambulatory Visit | Attending: Nurse Practitioner | Admitting: Nurse Practitioner

## 2019-10-17 DIAGNOSIS — Z87891 Personal history of nicotine dependence: Secondary | ICD-10-CM | POA: Insufficient documentation

## 2019-10-17 DIAGNOSIS — Z122 Encounter for screening for malignant neoplasm of respiratory organs: Secondary | ICD-10-CM | POA: Diagnosis present

## 2019-10-19 ENCOUNTER — Encounter: Payer: Self-pay | Admitting: *Deleted

## 2019-11-10 IMAGING — US US ABDOMEN LIMITED
1 series · 14 of 24 positions shown · non-contrast
Comparison: CT abdomen and pelvis 01/03/2017.

CLINICAL DATA: Elevated liver function tests.

EXAM:
ULTRASOUND ABDOMEN LIMITED RIGHT UPPER QUADRANT

[Series 1: us abdomen limited · 14 of 24 slices shown]
[im 1/24]
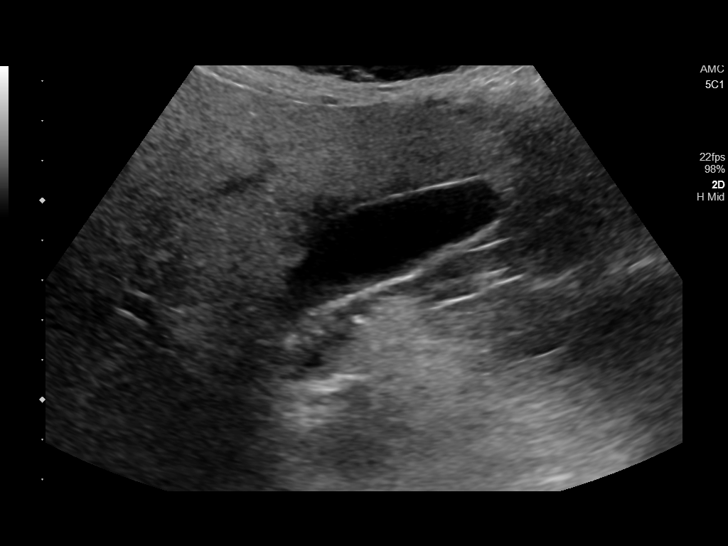
[im 3/24]
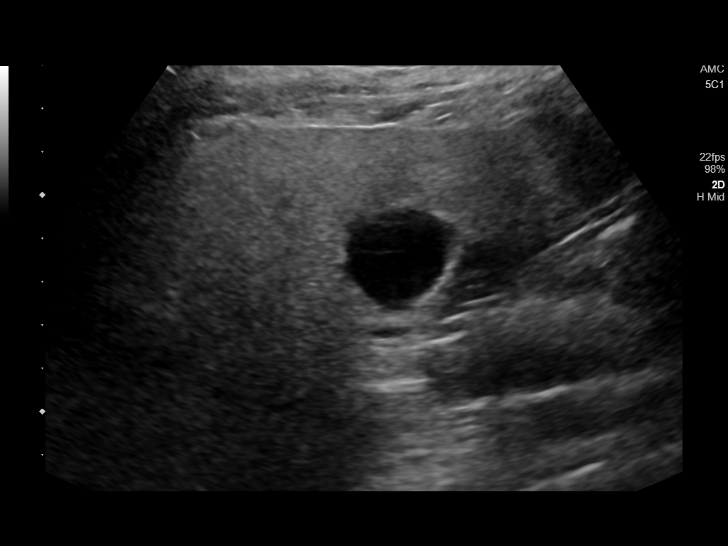
[im 5/24]
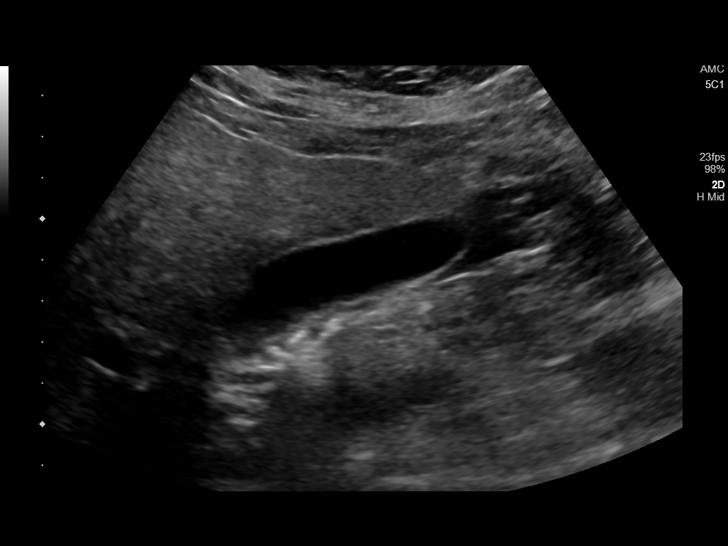
[im 7/24]
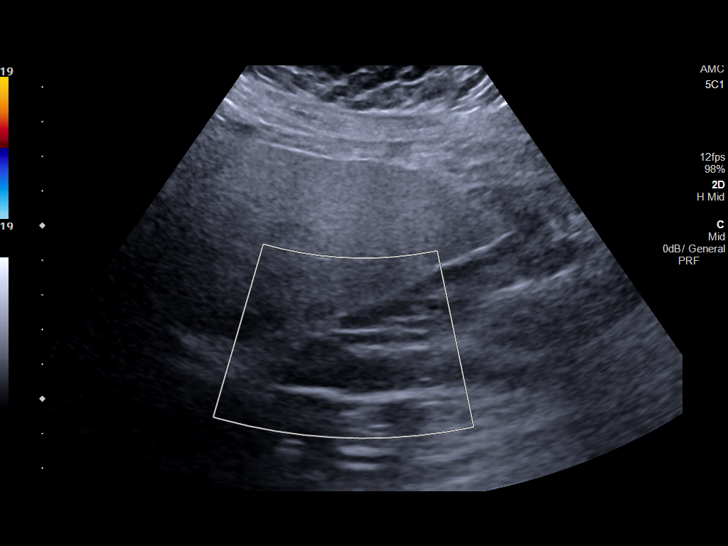
[im 8/24]
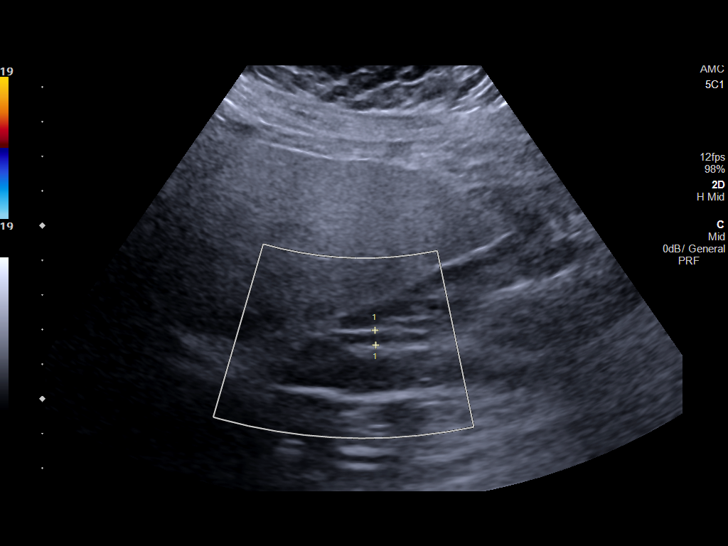
[im 10/24]
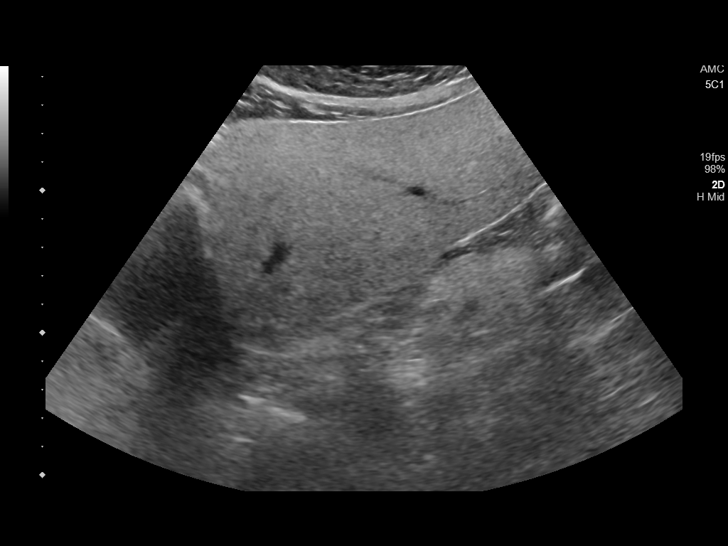
[im 12/24]
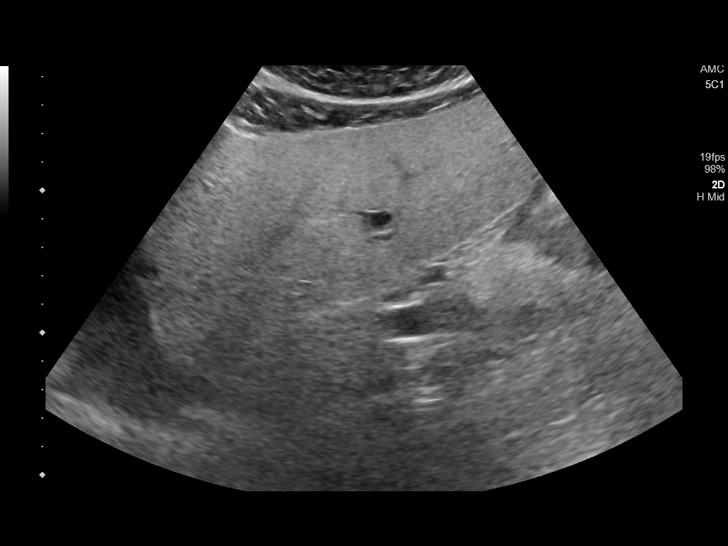
[im 13/24]
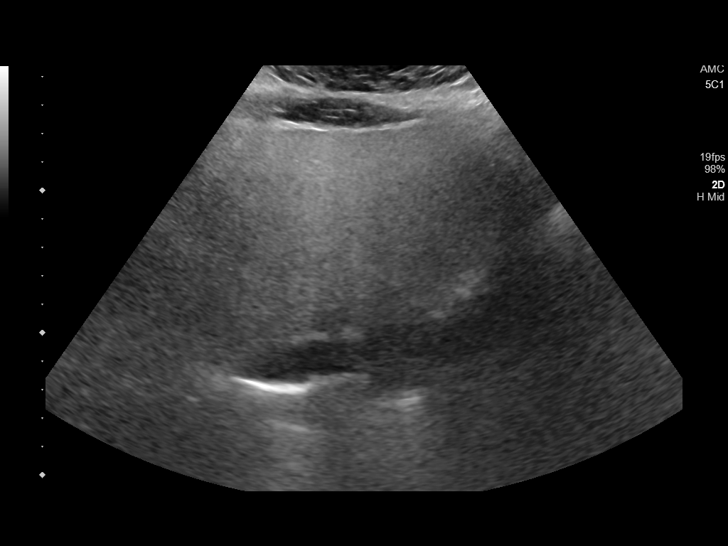
[im 15/24]
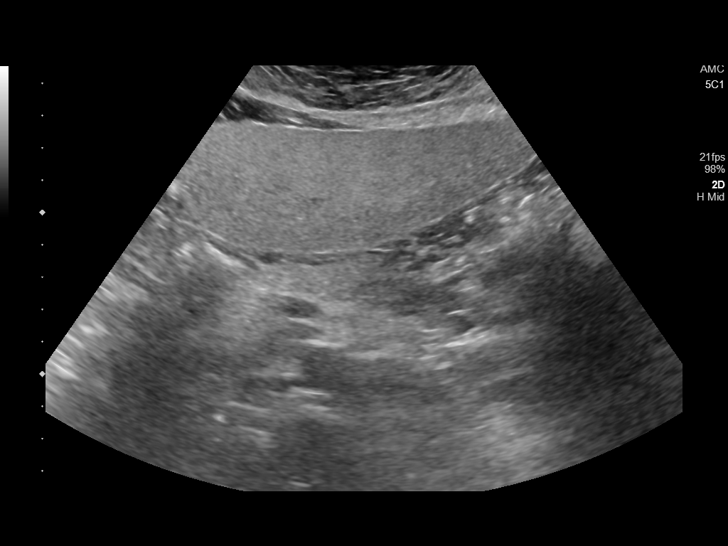
[im 17/24]
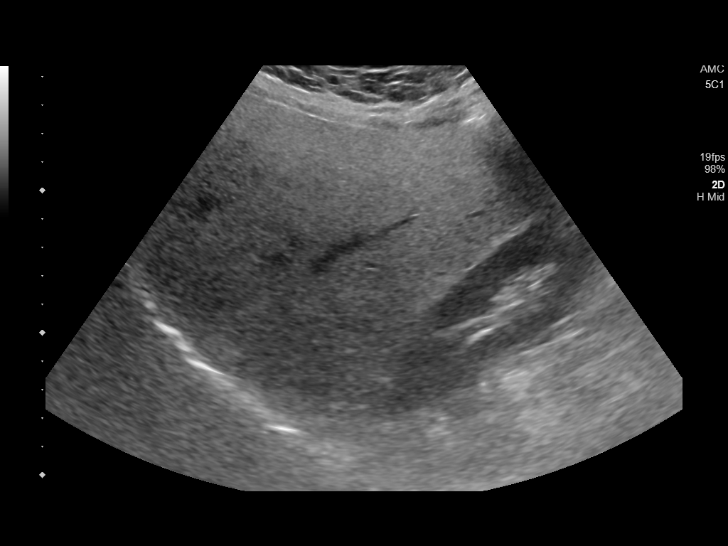
[im 19/24]
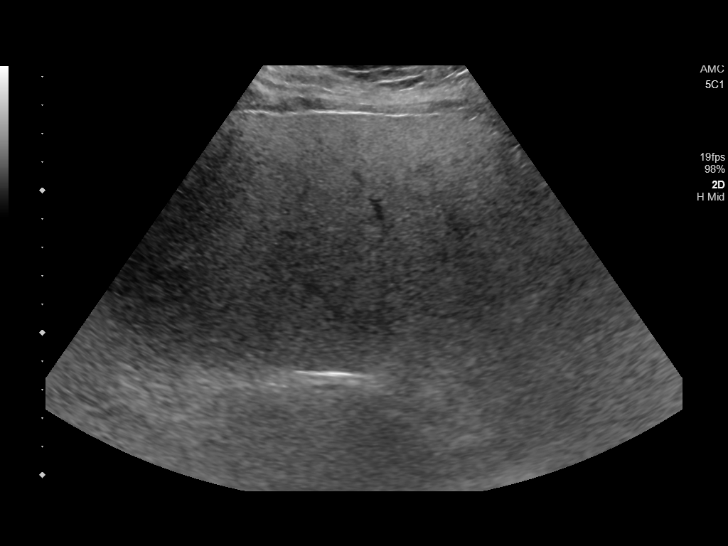
[im 20/24]
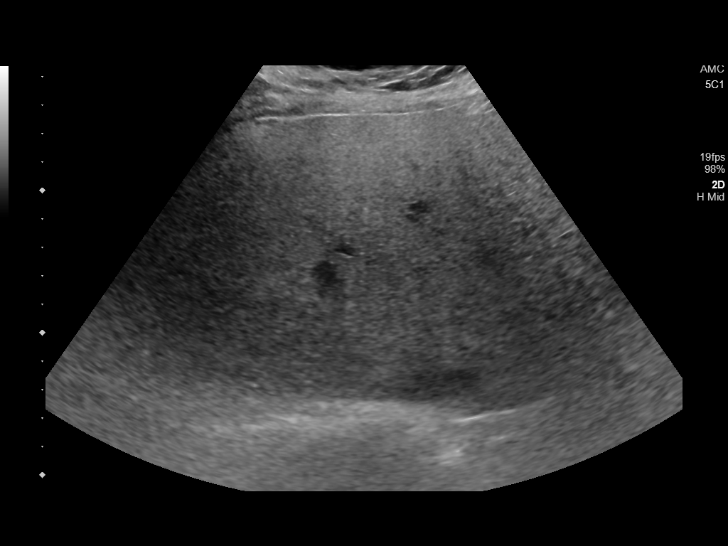
[im 22/24]
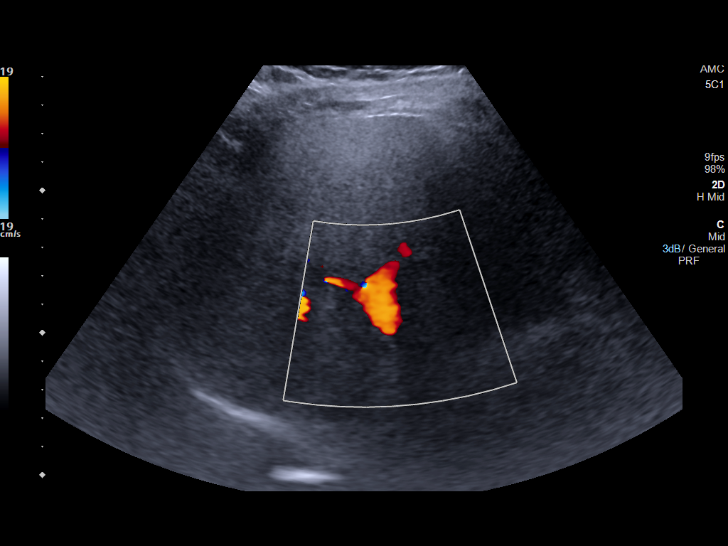
[im 24/24]
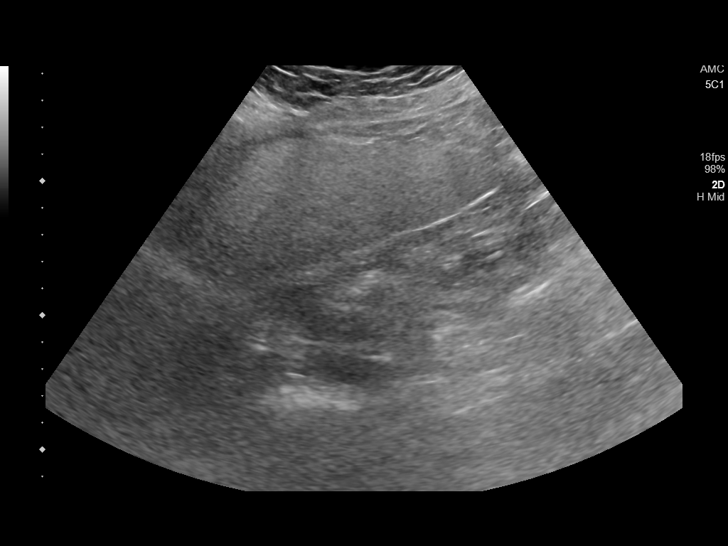

[14 of 24 positions shown; findings below may reference images not displayed]

FINDINGS: Gallbladder:

No gallstones or wall thickening visualized. No sonographic Murphy
sign noted by sonographer.

Common bile duct:

Diameter: 0.4 cm.

Liver:

No focal lesion. The liver appears dense with increased
echogenicity. Portal vein is patent on color Doppler imaging with
normal direction of blood flow towards the liver.

Other: None.
IMPRESSION: Fatty infiltration of the liver. The examination is otherwise
negative.

## 2020-01-19 ENCOUNTER — Encounter: Payer: Self-pay | Admitting: Nurse Practitioner

## 2020-01-19 DIAGNOSIS — R748 Abnormal levels of other serum enzymes: Secondary | ICD-10-CM | POA: Insufficient documentation

## 2020-01-22 ENCOUNTER — Encounter: Payer: BC Managed Care – PPO | Admitting: Family Medicine

## 2020-01-22 ENCOUNTER — Encounter: Payer: BC Managed Care – PPO | Admitting: Nurse Practitioner

## 2020-01-27 ENCOUNTER — Ambulatory Visit: Payer: BC Managed Care – PPO | Attending: Internal Medicine

## 2020-01-27 DIAGNOSIS — Z23 Encounter for immunization: Secondary | ICD-10-CM

## 2020-01-27 NOTE — Progress Notes (Signed)
   Covid-19 Vaccination Clinic  Name:  Deanna Velez    MRN: [id number] DOB: Aug 03, 1960  01/27/2020  Deanna Velez was observed post Covid-19 immunization for 15 minutes without incident. She was provided with Vaccine Information Sheet and instruction to access the V-Safe system.   Deanna Velez was instructed to call 911 with any severe reactions post vaccine: Marland Kitchen Difficulty breathing  . Swelling of face and throat  . A fast heartbeat  . A bad rash all over body  . Dizziness and weakness

## 2020-03-10 ENCOUNTER — Telehealth: Payer: BC Managed Care – PPO | Admitting: Nurse Practitioner

## 2020-03-10 ENCOUNTER — Telehealth: Payer: Self-pay | Admitting: *Deleted

## 2020-03-10 NOTE — Telephone Encounter (Addendum)
Error

## 2020-03-11 ENCOUNTER — Telehealth (INDEPENDENT_AMBULATORY_CARE_PROVIDER_SITE_OTHER): Payer: Self-pay | Admitting: Family Medicine

## 2020-03-11 ENCOUNTER — Other Ambulatory Visit: Payer: Self-pay

## 2020-03-11 ENCOUNTER — Encounter: Payer: Self-pay | Admitting: Family Medicine

## 2020-03-11 VITALS — BP 123/78 | Temp 95.8°F

## 2020-03-11 DIAGNOSIS — Z20822 Contact with and (suspected) exposure to covid-19: Secondary | ICD-10-CM

## 2020-03-11 NOTE — Patient Instructions (Signed)
It was great to see you!  Our plans for today:  - See below for self-isolation guidelines. You may end your quarantine once you are 10 days from symptom onset and fever free for 24 hours without use of tylenol or ibuprofen.   - Certainly, if you are having difficulties breathing or unable to keep down fluids, go to the Emergency Department.   Take care and seek immediate care sooner if you develop any concerns.   Dr. Ky Barban     Person Under Monitoring Name: Deanna Velez  Location: [place] [address]   Infection Prevention Recommendations for Individuals Confirmed to have, or Being Evaluated for, 2019 Novel Coronavirus (COVID-19) Infection Who Receive Care at Home  Individuals who are confirmed to have, or are being evaluated for, COVID-19 should follow the prevention steps below until a healthcare provider or local or state health department says they can return to normal activities.  Stay home except to get medical care You should restrict activities outside your home, except for getting medical care. Do not go to work, school, or public areas, and do not use public transportation or taxis.  Call ahead before visiting your doctor Before your medical appointment, call the healthcare provider and tell them that you have, or are being evaluated for, COVID-19 infection. This will help the healthcare provider's office take steps to keep other people from getting infected. Ask your healthcare provider to call the local or state health department.  Monitor your symptoms Seek prompt medical attention if your illness is worsening (e.g., difficulty breathing). Before going to your medical appointment, call the healthcare provider and tell them that you have, or are being evaluated for, COVID-19 infection. Ask your healthcare provider to call the local or state health department.  Wear a facemask You should wear a facemask that covers your nose and  mouth when you are in the same room with other people and when you visit a healthcare provider. People who live with or visit you should also wear a facemask while they are in the same room with you.  Separate yourself from other people in your home As much as possible, you should stay in a different room from other people in your home. Also, you should use a separate bathroom, if available.  Avoid sharing household items You should not share dishes, drinking glasses, cups, eating utensils, towels, bedding, or other items with other people in your home. After using these items, you should wash them thoroughly with soap and water.  Cover your coughs and sneezes Cover your mouth and nose with a tissue when you cough or sneeze, or you can cough or sneeze into your sleeve. Throw used tissues in a lined trash can, and immediately wash your hands with soap and water for at least 20 seconds or use an alcohol-based hand rub.  Wash your [facility] your hands often and thoroughly with soap and water for at least 20 seconds. You can use an alcohol-based hand sanitizer if soap and water are not available and if your hands are not visibly dirty. Avoid touching your eyes, nose, and mouth with unwashed hands.   Prevention Steps for Caregivers and Household Members of Individuals Confirmed to have, or Being Evaluated for, COVID-19 Infection Being Cared for in the Home  If you live with, or provide care at home for, a person confirmed to have, or being evaluated for, COVID-19 infection please follow these guidelines to prevent infection:  Follow healthcare provider's  instructions Make sure that you understand and can help the patient follow any healthcare provider instructions for all care.  Provide for the patient's basic needs You should help the patient with basic needs in the home and provide support for getting groceries, prescriptions, and other personal needs.  Monitor the patient's  symptoms If they are getting sicker, call his or her medical provider and tell them that the patient has, or is being evaluated for, COVID-19 infection. This will help the healthcare provider's office take steps to keep other people from getting infected. Ask the healthcare provider to call the local or state health department.  Limit the number of people who have contact with the patient  If possible, have only one caregiver for the patient.  Other household members should stay in another home or place of residence. If this is not possible, they should stay  in another room, or be separated from the patient as much as possible. Use a separate bathroom, if available.  Restrict visitors who do not have an essential need to be in the home.  Keep older adults, very young children, and other sick people away from the patient Keep older adults, very young children, and those who have compromised immune systems or chronic health conditions away from the patient. This includes people with chronic heart, lung, or kidney conditions, diabetes, and cancer.  Ensure good ventilation Make sure that shared spaces in the home have good air flow, such as from an air conditioner or an opened window, weather permitting.  Wash your hands often  Wash your hands often and thoroughly with soap and water for at least 20 seconds. You can use an alcohol based hand sanitizer if soap and water are not available and if your hands are not visibly dirty.  Avoid touching your eyes, nose, and mouth with unwashed hands.  Use disposable paper towels to dry your hands. If not available, use dedicated cloth towels and replace them when they become wet.  Wear a facemask and gloves  Wear a disposable facemask at all times in the room and gloves when you touch or have contact with the patient's blood, body fluids, and/or secretions or excretions, such as sweat, saliva, sputum, nasal mucus, vomit, urine, or feces.  Ensure  the mask fits over your nose and mouth tightly, and do not touch it during use.  Throw out disposable facemasks and gloves after using them. Do not reuse.  Wash your hands immediately after removing your facemask and gloves.  If your personal clothing becomes contaminated, carefully remove clothing and launder. Wash your hands after handling contaminated clothing.  Place all used disposable facemasks, gloves, and other waste in a lined container before disposing them with other household waste.  Remove gloves and wash your hands immediately after handling these items.  Do not share dishes, glasses, or other household items with the patient  Avoid sharing household items. You should not share dishes, drinking glasses, cups, eating utensils, towels, bedding, or other items with a patient who is confirmed to have, or being evaluated for, COVID-19 infection.  After the person uses these items, you should wash them thoroughly with soap and water.  Wash laundry thoroughly  Immediately remove and wash clothes or bedding that have blood, body fluids, and/or secretions or excretions, such as sweat, saliva, sputum, nasal mucus, vomit, urine, or feces, on them.  Wear gloves when handling laundry from the patient.  Read and follow directions on labels of laundry or clothing items and  detergent. In general, wash and dry with the warmest temperatures recommended on the label.  Clean all areas the individual has used often  Clean all touchable surfaces, such as counters, tabletops, doorknobs, bathroom fixtures, toilets, phones, keyboards, tablets, and bedside tables, every day. Also, clean any surfaces that may have blood, body fluids, and/or secretions or excretions on them.  Wear gloves when cleaning surfaces the patient has come in contact with.  Use a diluted bleach solution (e.g., dilute bleach with 1 part bleach and 10 parts water) or a household disinfectant with a label that says  EPA-registered for coronaviruses. To make a bleach solution at home, add 1 tablespoon of bleach to 1 quart (4 cups) of water. For a larger supply, add  cup of bleach to 1 gallon (16 cups) of water.  Read labels of cleaning products and follow recommendations provided on product labels. Labels contain instructions for safe and effective use of the cleaning product including precautions you should take when applying the product, such as wearing gloves or eye protection and making sure you have good ventilation during use of the product.  Remove gloves and wash hands immediately after cleaning.  Monitor yourself for signs and symptoms of illness Caregivers and household members are considered close contacts, should monitor their health, and will be asked to limit movement outside of the home to the extent possible. Follow the monitoring steps for close contacts listed on the symptom monitoring form.   ? If you have additional questions, contact your local health department or call the epidemiologist on call at [phone] (available 24/7). ? This guidance is subject to change. For the most up-to-date guidance from [facility], please refer to their website: [email or web address]

## 2020-03-11 NOTE — Progress Notes (Signed)
Virtual Visit via Video Note  I connected with Deanna Velez on 03/11/20 at  8:40 AM EST by a video enabled telemedicine application and verified that I am speaking with the correct person using two identifiers.  Location: Patient: home Provider: [facility]   I discussed the limitations of evaluation and management by telemedicine and the availability of in person appointments. The patient expressed understanding and agreed to proceed.  History of Present Illness:  UPPER RESPIRATORY TRACT INFECTION - husband COVID+ 03/07/20. - symptom onset: 03/04/20 - tested on 03/06/20, negative.  Worst symptom: congestion, headache. Fever: no Cough: yes, productive Shortness of breath: no Wheezing: slight Chest pain: rib pain with cough Chest tightness: no Chest congestion: yes Nasal congestion: yes Runny nose: yes Sneezing: yes Sore throat: no Sinus pressure: yes Headache: yes Ear pain: no  Ear pressure: no  Vomiting: no Sick contacts: yes Context: worse Recurrent sinusitis: no Relief with OTC cold/cough medications: some relief with benadryl.  Treatments attempted: ibuprofen and anti-histamine.     Observations/Objective:  Patient had trouble connecting to video visit, entirety of visit conducted over the phone.  Speaks in full sentences, no respiratory distress.  Assessment and Plan:  VIRAL URI With exposure to COVID. May have been tested too early in disease course given +exposure and symptoms, will retest. Discussed self-quarantine, OTC symptom relief and reasons to present for emergent care.      I discussed the assessment and treatment plan with the patient. The patient was provided an opportunity to ask questions and all were answered. The patient agreed with the plan and demonstrated an understanding of the instructions.   The patient was advised to call back or seek an in-person evaluation if the symptoms worsen or if the condition fails to improve as anticipated.  I provided  14 minutes of non-face-to-face time during this encounter.   Myles Gip, DO

## 2020-03-16 LAB — NOVEL CORONAVIRUS, NAA: SARS-CoV-2, NAA: NOT DETECTED

## 2020-04-10 ENCOUNTER — Other Ambulatory Visit: Payer: Self-pay | Admitting: Family Medicine

## 2020-04-10 NOTE — Telephone Encounter (Signed)
3o day supply sent for patient to make an appointment.

## 2020-04-10 NOTE — Telephone Encounter (Signed)
Requested medication (s) are due for refill today:   Yes  Requested medication (s) are on the active medication list:   Yes  Future visit scheduled:   No   Last ordered: 07/05/2019 #90, 1 refill  Clinic note:  Returned because last seen by Merrie Roof then Dr. Ky Barban.   Assigned to Jon Billings however she has not seen this pt.  Returned for review for refill.   Requested Prescriptions  Pending Prescriptions Disp Refills   amLODipine (NORVASC) 5 MG tablet [Pharmacy Med Name: amLODIPine BESYLATE 5 MG TAB] 90 tablet 1    Sig: TAKE ONE TABLET BY MOUTH DAILY      Cardiovascular:  Calcium Channel Blockers Passed - 04/10/2020 10:37 AM      Passed - Last BP in normal range    BP Readings from Last 1 Encounters:  03/11/20 123/78          Passed - Valid encounter within last 6 months    Recent Outpatient Visits           1 month ago Close exposure to COVID-19 virus   [facility] Myles Gip, DO   9 months ago Essential hypertension   [facility] [place], [place]   1 year ago Pulmonary emphysema, unspecified emphysema type [facility])   [facility] [place], [place]   1 year ago Essential hypertension   [facility] Merrie Roof [place], [place]   1 year ago Essential hypertension   [place], [place], [place]

## 2020-04-10 NOTE — Telephone Encounter (Signed)
Patient needs an appointment before refill. Last visit was 07/05/19 and video visit 03/11/20.

## 2020-04-13 NOTE — Telephone Encounter (Signed)
Unable to lvm to make this apt/  

## 2020-04-14 NOTE — Telephone Encounter (Signed)
LVM TO MAKE APT 

## 2020-04-15 NOTE — Telephone Encounter (Signed)
Pt schduled for apt with other provider as he ins is [employer]

## 2020-05-11 ENCOUNTER — Ambulatory Visit: Payer: Self-pay | Admitting: Nurse Practitioner

## 2020-05-20 ENCOUNTER — Telehealth: Payer: Self-pay

## 2020-05-20 ENCOUNTER — Other Ambulatory Visit: Payer: Self-pay

## 2020-05-20 MED ORDER — AMLODIPINE BESYLATE 5 MG PO TABS: 5.0000 mg | ORAL_TABLET | Freq: Every day | ORAL | 0 refills | Status: DC

## 2020-05-20 NOTE — Telephone Encounter (Signed)
Pt. Called needs refill of amlodipine and losartan. Attempted to refill, will not go electronically. Called and gave verbal for 1 month, no refills to [facility].

## 2020-05-25 ENCOUNTER — Ambulatory Visit (INDEPENDENT_AMBULATORY_CARE_PROVIDER_SITE_OTHER): Payer: BC Managed Care – PPO | Admitting: Nurse Practitioner

## 2020-05-25 ENCOUNTER — Encounter: Payer: Self-pay | Admitting: Nurse Practitioner

## 2020-05-25 ENCOUNTER — Other Ambulatory Visit: Payer: Self-pay

## 2020-05-25 VITALS — BP 122/79 | HR 85 | Temp 98.2°F | Wt 163.8 lb

## 2020-05-25 DIAGNOSIS — K148 Other diseases of tongue: Secondary | ICD-10-CM | POA: Diagnosis not present

## 2020-05-25 DIAGNOSIS — Z23 Encounter for immunization: Secondary | ICD-10-CM | POA: Diagnosis not present

## 2020-05-25 DIAGNOSIS — J432 Centrilobular emphysema: Secondary | ICD-10-CM | POA: Diagnosis not present

## 2020-05-25 DIAGNOSIS — I10 Essential (primary) hypertension: Secondary | ICD-10-CM

## 2020-05-25 DIAGNOSIS — E6609 Other obesity due to excess calories: Secondary | ICD-10-CM

## 2020-05-25 DIAGNOSIS — K760 Fatty (change of) liver, not elsewhere classified: Secondary | ICD-10-CM | POA: Diagnosis not present

## 2020-05-25 DIAGNOSIS — R748 Abnormal levels of other serum enzymes: Secondary | ICD-10-CM

## 2020-05-25 DIAGNOSIS — E669 Obesity, unspecified: Secondary | ICD-10-CM | POA: Insufficient documentation

## 2020-05-25 DIAGNOSIS — Z6831 Body mass index (BMI) 31.0-31.9, adult: Secondary | ICD-10-CM

## 2020-05-25 DIAGNOSIS — Z6829 Body mass index (BMI) 29.0-29.9, adult: Secondary | ICD-10-CM | POA: Insufficient documentation

## 2020-05-25 DIAGNOSIS — Z1211 Encounter for screening for malignant neoplasm of colon: Secondary | ICD-10-CM

## 2020-05-25 DIAGNOSIS — Z87891 Personal history of nicotine dependence: Secondary | ICD-10-CM

## 2020-05-25 MED ORDER — LOSARTAN POTASSIUM 50 MG PO TABS: 1.0000 | ORAL_TABLET | Freq: Every day | ORAL | 4 refills | Status: DC

## 2020-05-25 MED ORDER — AMLODIPINE BESYLATE 5 MG PO TABS: 5.0000 mg | ORAL_TABLET | Freq: Every day | ORAL | 4 refills | Status: DC

## 2020-05-25 NOTE — Progress Notes (Addendum)
BP 122/79   Pulse 85   Temp 98.2 F (36.8 C) (Oral)   Wt 163 lb 12.8 oz (74.3 kg)   LMP  (LMP Unknown)   SpO2 96%   BMI 31.99 kg/m    Subjective:    Patient ID: Deanna Velez, female    DOB: October 14, 1960, 60 y.o.   MRN: [id number]  HPI: Deanna Velez is a 60 y.o. female  Chief Complaint  Patient presents with  . Medication Refill    Patient states she is here for medication refills. Patient states she would like to discuss "feeling like something is stuck or in her throat" Patient states she notices after she eats that it feels like something it in her throat. Patient denies having any pain.    HYPERTENSION / HYPERLIPIDEMIA Continues on Amlodipine and Losartan.  No current statin, last LDL in 2020 was 96.   Satisfied with current treatment? yes Duration of hypertension: chronic BP monitoring frequency: not checking BP range:  BP medication side effects: no Cholesterol supplements: none Aspirin: no Recent stressors: no Recurrent headaches: no Visual changes: no Palpitations: no Dyspnea: no Chest pain: no Lower extremity edema: no Dizzy/lightheaded: no   COPD No current inhalers.  Quit 1 1/2 years ago -- smoked for about 40 years.   Goes for annual lung screening, last 10/17/19 -- noted mild centrilobular emphysema and mild diffuse hepatic steatosis.   COPD status: stable Satisfied with current treatment?: yes Oxygen use: no Dyspnea frequency: none Cough frequency: none Rescue inhaler frequency: none at home Limitation of activity: no Productive cough: none Last Spirometry: none Pneumovax: Up to Date Influenza: Up to Date   THROAT SENSATION Feels like behind her tongue on her esophagus she has a skin tag.  Feels like this is blocking a little tiny bit of food.  Present x 6 months.  No difficulty with swallowing.  Has seen Dr. Tami Ribas with ENT before for wart on tongue in past and would like to return to him. Duration: months Description of symptom: like flappy  skin Onset: 6 months Progressively getting worse: no Alleviatiating factors: nothing Provoking factors: gummy bears and gummy worms Status: stable EGD: no Weight loss: no Sensation of lump in throat: no Heartburn: no Odynophagia: no Nausea: no Vomiting: no Drooling/nasal regurgitation/food spillage: no Coughing/choking/dysphonia: no Dysarthria: no Hematemesis: no Regurgitation of undigested food/halitosis: no Chest pain: no  Relevant past medical, surgical, family and social history reviewed and updated as indicated. Interim medical history since our last visit reviewed. Allergies and medications reviewed and updated.  Review of Systems  Constitutional: Negative for activity change, appetite change, diaphoresis, fatigue and fever.  Respiratory: Negative for cough, chest tightness, shortness of breath and wheezing.   Cardiovascular: Negative for chest pain, palpitations and leg swelling.  Gastrointestinal: Negative.   Neurological: Negative.   Psychiatric/Behavioral: Negative.     Per HPI unless specifically indicated above     Objective:    BP 122/79   Pulse 85   Temp 98.2 F (36.8 C) (Oral)   Wt 163 lb 12.8 oz (74.3 kg)   LMP  (LMP Unknown)   SpO2 96%   BMI 31.99 kg/m   Wt Readings from Last 3 Encounters:  05/25/20 163 lb 12.8 oz (74.3 kg)  10/17/19 179 lb (81.2 kg)  07/05/19 179 lb 2 oz (81.3 kg)    Physical Exam Vitals and nursing note reviewed.  Constitutional:      General: She is awake. She is not in acute distress.  Appearance: She is well-developed and well-groomed. She is obese. She is not ill-appearing.  HENT:     Head: Normocephalic.     Right Ear: Hearing normal.     Left Ear: Hearing normal.     Nose: Nose normal. No rhinorrhea.     Mouth/Throat:     Mouth: Mucous membranes are moist.     Tongue: Lesions present.     Pharynx: Oropharynx is clear.     Tonsils: 0 on the right. 0 on the left.   Eyes:     General: Lids are normal.         Right eye: No discharge.        Left eye: No discharge.     Conjunctiva/sclera: Conjunctivae normal.     Pupils: Pupils are equal, round, and reactive to light.  Neck:     Thyroid: No thyromegaly.     Vascular: No carotid bruit.  Cardiovascular:     Rate and Rhythm: Normal rate and regular rhythm.     Heart sounds: Normal heart sounds. No murmur heard. No gallop.   Pulmonary:     Effort: Pulmonary effort is normal. No accessory muscle usage or respiratory distress.     Breath sounds: Normal breath sounds.  Abdominal:     General: Bowel sounds are normal.     Palpations: Abdomen is soft. There is no hepatomegaly or splenomegaly.  Musculoskeletal:     Cervical back: Normal range of motion and neck supple.     Right lower leg: No edema.     Left lower leg: No edema.  Lymphadenopathy:     Cervical: No cervical adenopathy.  Skin:    General: Skin is warm and dry.  Neurological:     Mental Status: She is alert and oriented to person, place, and time.  Psychiatric:        Attention and Perception: Attention normal.        Mood and Affect: Mood normal.        Speech: Speech normal.        Behavior: Behavior normal. Behavior is cooperative.        Thought Content: Thought content normal.    Results for orders placed or performed in visit on 03/11/20  Novel Coronavirus, NAA ([facility])   Specimen: Nasopharyngeal(NP) swabs in vial transport medium  Result Value Ref Range   SARS-CoV-2, NAA Not Detected Not Detected      Assessment & Plan:   Problem List Items Addressed This Visit      Cardiovascular and Mediastinum   Hypertension    Chronic, ongoing with BP at goal on check today.  Recommend she monitor BP at least a few mornings a week at home and document.  DASH diet at home.  Continue current medication regimen and adjust as needed.  Labs today: CMP, lipid, and TSH.  Refills sent in.  Return in 6 months.       Relevant Medications   amLODipine (NORVASC) 5 MG tablet    losartan (COZAAR) 50 MG tablet   Other Relevant Orders   Comprehensive metabolic panel   Lipid Panel w/o Chol/HDL Ratio   TSH     Respiratory   Centrilobular emphysema (HCC) - Primary    Chronic, ongoing.  Quit smoking 1 1/2 years ago, praised for continued cessation.  At this time no inhaler regimen and overall no symptoms.  Will plan on spirometry next visit and recommend to continue yearly lung screening until age 54.  Return in  6 months for chronic disease visit.        Digestive   Fatty liver    Noted on past CT imaging for lung screening.  Recommend minimal Tylenol and alcohol use.  CMP today.        Other   Former smoker, stopped smoking many years ago    Stopped  1 1/2 years ago, praised for continued cessation.  Recommend to continue cessation and yearly lung screens.      Elevated alkaline phosphatase level    Recheck CMP today and consider addition of GGT next labs if ongoing elevations.      Elevated liver enzymes    Noted on labs May 2021 with calculated R Factor 4.2 (mixed) and fatty liver noted on past imaging.  Recheck CMP today and consider addition of GGT if ongoing elevations and liver ultrasound.  Recommend minimal Tylenol and alcohol use at home.      Lesion of tongue    Noted on exam, suspicious in appearance and history of smoking.  Will refer to Dr. Tami Ribas at ENT who she has seen in past.  Appreciate his input.      Relevant Orders   Ambulatory referral to ENT   Obesity    BMI 31.99.  Recommended eating smaller high protein, low fat meals more frequently and exercising 30 mins a day 5 times a week with a goal of 10-15lb weight loss in the next 3 months. Patient voiced their understanding and motivation to adhere to these recommendations.        Other Visit Diagnoses    Pneumococcal vaccination given       PPSV23 today per guidelines   Relevant Orders   Pneumococcal polysaccharide vaccine 23-valent greater than or equal to 2yo subcutaneous/IM  (Completed)   Colon cancer screening       Cologuard ordered   Relevant Orders   Cologuard       Follow up plan: Return in about 8 weeks (around 07/20/2020) for TONGUE LESION, ELEVATED LFT, and COPD -- with spirometry.

## 2020-05-25 NOTE — Patient Instructions (Signed)

## 2020-05-25 NOTE — Assessment & Plan Note (Signed)
Chronic, ongoing.  Quit smoking > 1 1/2 years ago, praised for continued cessation.  At this time no inhaler regimen and overall no symptoms.  Will plan on spirometry next visit and recommend to continue yearly lung screening until age 60.  Return in 6 months for chronic disease visit. 

## 2020-05-25 NOTE — Assessment & Plan Note (Signed)
BMI 31.99.  Recommended eating smaller high protein, low fat meals more frequently and exercising 30 mins a day 5 times a week with a goal of 10-15lb weight loss in the next 3 months. Patient voiced their understanding and motivation to adhere to these recommendations.

## 2020-05-25 NOTE — Assessment & Plan Note (Signed)
Noted on past CT imaging for lung screening.  Recommend minimal Tylenol and alcohol use.  CMP today.

## 2020-05-25 NOTE — Assessment & Plan Note (Signed)
Noted on exam, suspicious in appearance and history of smoking.  Will refer to Dr. Tami Ribas at ENT who she has seen in past.  Appreciate his input.

## 2020-05-25 NOTE — Assessment & Plan Note (Signed)
Recheck CMP today and consider addition of GGT next labs if ongoing elevations.

## 2020-05-25 NOTE — Assessment & Plan Note (Addendum)
Chronic, ongoing with BP at goal on check today.  Recommend she monitor BP at least a few mornings a week at home and document.  DASH diet at home.  Continue current medication regimen and adjust as needed.  Labs today: CMP, lipid, and TSH.  Refills sent in.  Return in 6 months.

## 2020-05-25 NOTE — Assessment & Plan Note (Signed)
Stopped  1 1/2 years ago, praised for continued cessation.  Recommend to continue cessation and yearly lung screens.

## 2020-05-25 NOTE — Assessment & Plan Note (Signed)
Noted on labs May 2021 with calculated R Factor 4.2 (mixed) and fatty liver noted on past imaging.  Recheck CMP today and consider addition of GGT if ongoing elevations and liver ultrasound.  Recommend minimal Tylenol and alcohol use at home.

## 2020-05-26 LAB — COMPREHENSIVE METABOLIC PANEL
ALT: 102 IU/L — ABNORMAL HIGH (ref 0–32)
AST: 180 IU/L — ABNORMAL HIGH (ref 0–40)
Albumin/Globulin Ratio: 1.5 (ref 1.2–2.2)
Albumin: 5 g/dL — ABNORMAL HIGH (ref 3.8–4.9)
Alkaline Phosphatase: 129 IU/L — ABNORMAL HIGH (ref 44–121)
BUN/Creatinine Ratio: 11 (ref 9–23)
BUN: 7 mg/dL (ref 6–24)
Bilirubin Total: 0.6 mg/dL (ref 0.0–1.2)
CO2: 16 mmol/L — ABNORMAL LOW (ref 20–29)
Calcium: 10 mg/dL (ref 8.7–10.2)
Chloride: 101 mmol/L (ref 96–106)
Creatinine, Ser: 0.64 mg/dL (ref 0.57–1.00)
Globulin, Total: 3.3 g/dL (ref 1.5–4.5)
Glucose: 94 mg/dL (ref 65–99)
Potassium: 4 mmol/L (ref 3.5–5.2)
Sodium: 140 mmol/L (ref 134–144)
Total Protein: 8.3 g/dL (ref 6.0–8.5)
eGFR: 102 mL/min/{1.73_m2} (ref >59)

## 2020-05-26 LAB — LIPID PANEL W/O CHOL/HDL RATIO
Cholesterol, Total: 214 mg/dL — ABNORMAL HIGH (ref 100–199)
HDL: 55 mg/dL (ref >39)
LDL Chol Calc (NIH): 124 mg/dL — ABNORMAL HIGH (ref 0–99)
Triglycerides: 200 mg/dL — ABNORMAL HIGH (ref 0–149)
VLDL Cholesterol Cal: 35 mg/dL (ref 5–40)

## 2020-05-26 LAB — TSH: TSH: 1.62 u[IU]/mL (ref 0.450–4.500)

## 2020-05-26 NOTE — Progress Notes (Signed)
Contacted via MyChart The 10-year ASCVD risk score Mikey Bussing DC Jr., et al., 2013) is: 3.5%   Values used to calculate the score:     Age: 60 years     Sex: Female     Is Non-Hispanic African American: No     Diabetic: No     Tobacco smoker: No     Systolic Blood Pressure: 622 mmHg     Is BP treated: Yes     HDL Cholesterol: 55 mg/dL     Total Cholesterol: 214 mg/dL   Good evening Deanna Velez, your labs have returned.  Liver function tests remain elevated, although some mild reduction in ALT this check.  I know you have had imaging in past.  Due to these ongoing elevations I would recommend a visit with GI to further assess your fatty liver disease and obtain any further recommendations.  If this something you would like to pursue, if so I will order.  I also recommend heavy focus on diet changes, healthy options, and no Tylenol or alcohol use.  Thyroid level normal.  Cholesterol levels remain elevated.  Continue focus on healthy diet and regular activity, we will recheck next visit.  Any questions? Keep being awesome!!  Thank you for allowing me to participate in your care. Kindest regards, Brittanie Dosanjh

## 2020-06-11 ENCOUNTER — Ambulatory Visit: Payer: Self-pay | Admitting: *Deleted

## 2020-06-11 NOTE — Telephone Encounter (Signed)
Fyi scheduled tomorrow

## 2020-06-11 NOTE — Telephone Encounter (Signed)
Per initial encounter, "Patient would like to be contacted regarding congestion; Patient has experienced sinus congestion for roughly two weeks; During the first two or three days the congestion was mostly clear in color with little discomfort; The congestion has become noticeably darker in color and rougher/ "crustier" in consistency; Patient disclosed that saline does work for them some to help with congestion; Please contact to further advise when possible; contacted pt regarding her symptoms; she started on 06/25/20; she says her bistruksare painful, crusty, and bloody; she had pressure in her nares but it was relieved when she expelled a blood clot; the pt says she has been using nasal saline to help her symptoms; recommnedations made per nurse triage protocol; decision tree completed; the pt sees Vladimir Faster at [facility]; the but the provider has no no availability within time frame per guidelines; pt requested a PM appt today or anytime on 06/12/20; pt offered MyChart appt with Marnee Guarneri, at 1100 06/12/20; will route to office for notication .   Her symptoms started  She has used saline and says her secretions are bloody and crusty; her symptoms started on 05/26/20; the pt says she nostril pain, and right side sinus pain  Reason for Disposition . [1] Sinus congestion (pressure, fullness) AND [2] present > 10 days  Answer Assessment - Initial Assessment Questions 1. LOCATION: "Where does it hurt?"      Bilateral nares 2. ONSET: "When did the sinus pain start?"  (e.g., hours, days)  05/26/20 3. SEVERITY: "How bad is the pain?"   (Scale 1-10; mild, moderate or severe)   - MILD (1-3): doesn't interfere with normal activities    - MODERATE (4-7): interferes with normal activities (e.g., work or school) or awakens from sleep   - SEVERE (8-10): excruciating pain and patient unable to do any normal activities       moderate 4. RECURRENT SYMPTOM: "Have you ever had sinus problems  before?" If Yes, ask: "When was the last time?" and "What happened that time?"     yes 5. NASAL CONGESTION: "Is the nose blocked?" If Yes, ask: "Can you open it or must you breathe through the mouth?"     Yes; has been using saline 6. NASAL DISCHARGE: "Do you have discharge from your nose?" If so ask, "What color?"     bloody 7. FEVER: "Do you have a fever?" If Yes, ask: "What is it, how was it measured, and when did it start?"     no 8. OTHER SYMPTOMS: "Do you have any other symptoms?" (e.g., sore throat, cough, earache, difficulty breathing)   Right sinus pressure on 06/11/20; pressure in bilateral nares; cleared when blew out blood clot 9. PREGNANCY: "Is there any chance you are pregnant?" "When was your last menstrual period?"     *No Answer*  Protocols used: SINUS PAIN OR CONGESTION-A-AH

## 2020-06-12 ENCOUNTER — Other Ambulatory Visit: Payer: Self-pay

## 2020-06-12 ENCOUNTER — Encounter: Payer: Self-pay | Admitting: Nurse Practitioner

## 2020-06-12 ENCOUNTER — Telehealth (INDEPENDENT_AMBULATORY_CARE_PROVIDER_SITE_OTHER): Payer: BC Managed Care – PPO | Admitting: Nurse Practitioner

## 2020-06-12 DIAGNOSIS — J0110 Acute frontal sinusitis, unspecified: Secondary | ICD-10-CM | POA: Insufficient documentation

## 2020-06-12 MED ORDER — PREDNISONE 20 MG PO TABS: 40.0000 mg | ORAL_TABLET | Freq: Every day | ORAL | 0 refills | Status: AC

## 2020-06-12 MED ORDER — AMOXICILLIN-POT CLAVULANATE 875-125 MG PO TABS: 1.0000 | ORAL_TABLET | Freq: Two times a day (BID) | ORAL | 0 refills | Status: AC

## 2020-06-12 NOTE — Patient Instructions (Signed)

## 2020-06-12 NOTE — Progress Notes (Signed)
LMP  (LMP Unknown)    Subjective:    Patient ID: Deanna Velez, female    DOB: 11-09-1960, 60 y.o.   MRN: [id number]  HPI: Deanna Velez is a 60 y.o. female  Chief Complaint  Patient presents with  . Sinus Problem    Patient states after her appointment last week, she developed sinus problems such as bloody and green mucus drainage and states she has some dried and crust in her nasal and states she is having just bleeding now and the sinus issues have went away.     . This visit was completed via video visit through MyChart due to the restrictions of the COVID-19 pandemic. All issues as above were discussed and addressed. Physical exam was done as above through visual confirmation on video through MyChart. If it was felt that the patient should be evaluated in the office, they were directed there. The patient verbally consented to this visit. . Location of the patient: home . Location of the provider: home . Those involved with this call:  . Provider: Marnee Guarneri, DNP . CMA: Irena Reichmann, CMA . Front Desk/Registration: Jill Side  . Time spent on call: 21 minutes with patient face to face via video conference. More than 50% of this time was spent in counseling and coordination of care. 15 minutes total spent in review of patient's record and preparation of their chart.  . I verified patient identity using two factors (patient name and date of birth). Patient consents verbally to being seen via telemedicine visit today.    UPPER RESPIRATORY TRACT INFECTION Reports symptoms started after last visit in office, on 05/25/2020.  The next day started with normal sinus infection, lasted 2-3 days, then made a weird turn and got congested again -- blood and green mucus + nasal pain.  Has been using saline spray with aloe, which offers some relief -- relief for 10-20 minutes.  Having nose bleeds frequently, in nostril.  Has been using a steroid nasal spray at home.  Covid vaccinations up to date.   Will Covid test at home. Fever: no Cough: no Shortness of breath: no Wheezing: no Chest pain: no Chest tightness: no Chest congestion: no Nasal congestion: yes Runny nose: when sneezes Post nasal drip: yes Sneezing: yes Sore throat: yes when this started Swollen glands: no Sinus pressure: initially, improvng Headache: no Face pain: no Toothache: no Ear pain: none Ear pressure: yes "right Eyes red/itching:no Eye drainage/crusting: no  Vomiting: no Rash: no Fatigue: no Sick contacts: the week before she saw PCP was around friend with sinus issues Strep contacts: no  Context: fluctuating Recurrent sinusitis: no Relief with OTC cold/cough medications: yes  Treatments attempted: cold/sinus   Relevant past medical, surgical, family and social history reviewed and updated as indicated. Interim medical history since our last visit reviewed. Allergies and medications reviewed and updated.  Review of Systems  Constitutional: Negative for activity change, appetite change, fatigue and fever.  HENT: Positive for congestion, nosebleeds, postnasal drip, rhinorrhea, sinus pressure, sneezing and sore throat. Negative for ear discharge, ear pain, facial swelling, sinus pain and voice change.   Eyes: Negative.   Respiratory: Negative for cough, chest tightness, shortness of breath and wheezing.   Cardiovascular: Negative for chest pain, palpitations and leg swelling.  Gastrointestinal: Negative.   Endocrine: Negative.   Neurological: Negative.   Psychiatric/Behavioral: Negative.     Per HPI unless specifically indicated above     Objective:    LMP  (LMP Unknown)  Wt Readings from Last 3 Encounters:  05/25/20 163 lb 12.8 oz (74.3 kg)  10/17/19 179 lb (81.2 kg)  07/05/19 179 lb 2 oz (81.3 kg)    Physical Exam Vitals and nursing note reviewed.  Constitutional:      General: She is awake. She is not in acute distress.    Appearance: She is well-developed. She is not  ill-appearing.  HENT:     Head: Normocephalic.     Right Ear: Hearing normal.     Left Ear: Hearing normal.  Eyes:     General: Lids are normal.        Right eye: No discharge.        Left eye: No discharge.     Conjunctiva/sclera: Conjunctivae normal.  Pulmonary:     Effort: Pulmonary effort is normal. No accessory muscle usage or respiratory distress.  Musculoskeletal:     Cervical back: Normal range of motion.  Neurological:     Mental Status: She is alert and oriented to person, place, and time.  Psychiatric:        Attention and Perception: Attention normal.        Mood and Affect: Mood normal.        Behavior: Behavior normal. Behavior is cooperative.        Thought Content: Thought content normal.        Judgment: Judgment normal.    Results for orders placed or performed in visit on 05/25/20  Comprehensive metabolic panel  Result Value Ref Range   Glucose 94 65 - 99 mg/dL   BUN 7 6 - 24 mg/dL   Creatinine, Ser 0.64 0.57 - 1.00 mg/dL   eGFR 102 >59 mL/min/1.73   BUN/Creatinine Ratio 11 9 - 23   Sodium 140 134 - 144 mmol/L   Potassium 4.0 3.5 - 5.2 mmol/L   Chloride 101 96 - 106 mmol/L   CO2 16 (L) 20 - 29 mmol/L   Calcium 10.0 8.7 - 10.2 mg/dL   Total Protein 8.3 6.0 - 8.5 g/dL   Albumin 5.0 (H) 3.8 - 4.9 g/dL   Globulin, Total 3.3 1.5 - 4.5 g/dL   Albumin/Globulin Ratio 1.5 1.2 - 2.2   Bilirubin Total 0.6 0.0 - 1.2 mg/dL   Alkaline Phosphatase 129 (H) 44 - 121 IU/L   AST 180 (H) 0 - 40 IU/L   ALT 102 (H) 0 - 32 IU/L  Lipid Panel w/o Chol/HDL Ratio  Result Value Ref Range   Cholesterol, Total 214 (H) 100 - 199 mg/dL   Triglycerides 200 (H) 0 - 149 mg/dL   HDL 55 >39 mg/dL   VLDL Cholesterol Cal 35 5 - 40 mg/dL   LDL Chol Calc (NIH) 124 (H) 0 - 99 mg/dL  TSH  Result Value Ref Range   TSH 1.620 0.450 - 4.500 uIU/mL      Assessment & Plan:   Problem List Items Addressed This Visit      Respiratory   Acute non-recurrent frontal sinusitis - Primary     Acute with symptoms x 2 weeks and current nose bleeds.  Recommend she stop using steroid nasal spray and change to regular normal saline spray for nose.  If any worsening nose bleeds or bleed that will not stop, immediately go to UC.  Avoid ASA and Fish oil products.  Due to ongoing symptoms will send in Augmentin script and burst of Prednisone 40 MG x 5 days for symptoms relief.  Recommend she Covid test at home and  alert provider of results, self quarantine at this time until symptoms have improved.  Recommend: - Increased rest - Increasing Fluids - Acetaminophen as needed for fever/pain.  - Mucinex.  - Saline sinus flushes or a neti pot - Humidifying the air Return to office in 2 weeks for follow-up and sooner if any worsening of symptoms.      Relevant Medications   amoxicillin-clavulanate (AUGMENTIN) 875-125 MG tablet   predniSONE (DELTASONE) 20 MG tablet      I discussed the assessment and treatment plan with the patient. The patient was provided an opportunity to ask questions and all were answered. The patient agreed with the plan and demonstrated an understanding of the instructions.   The patient was advised to call back or seek an in-person evaluation if the symptoms worsen or if the condition fails to improve as anticipated.   I provided 21+ minutes of time during this encounter.  Follow up plan: Return in about 2 weeks (around 06/26/2020) for Sinus and nose bleed follow-up.

## 2020-06-12 NOTE — Assessment & Plan Note (Signed)
Acute with symptoms x 2 weeks and current nose bleeds.  Recommend she stop using steroid nasal spray and change to regular normal saline spray for nose.  If any worsening nose bleeds or bleed that will not stop, immediately go to UC.  Avoid ASA and Fish oil products.  Due to ongoing symptoms will send in Augmentin script and burst of Prednisone 40 MG x 5 days for symptoms relief.  Recommend she Covid test at home and alert provider of results, self quarantine at this time until symptoms have improved.  Recommend: - Increased rest - Increasing Fluids - Acetaminophen as needed for fever/pain.  - Mucinex.  - Saline sinus flushes or a neti pot - Humidifying the air Return to office in 2 weeks for follow-up and sooner if any worsening of symptoms.

## 2020-06-20 LAB — COLOGUARD: Cologuard: NEGATIVE

## 2020-06-27 LAB — COLOGUARD: COLOGUARD: NEGATIVE

## 2020-07-01 ENCOUNTER — Encounter: Payer: Self-pay | Admitting: Nurse Practitioner

## 2020-07-20 ENCOUNTER — Ambulatory Visit: Payer: BC Managed Care – PPO | Admitting: Nurse Practitioner

## 2020-07-29 ENCOUNTER — Ambulatory Visit: Payer: Self-pay | Admitting: *Deleted

## 2020-07-29 NOTE — Telephone Encounter (Signed)
Pt scheduled for 07/31/2020 with Dr.Johnson as she was unable to do ap ton 07/30/2020

## 2020-07-29 NOTE — Telephone Encounter (Signed)
Patient will need an appointment.  I can see her tomorrow with either of my morning virtual appts.

## 2020-07-29 NOTE — Telephone Encounter (Signed)
Patient is calling to report she was treated with antibiotic a few weeks ago. Patient states she developed a yeast infection and used the OTC 7 day Monistat. Symptoms returned a few day after finishing treatment so patient used the shouter 1 day treatment. Patient states now she has increased discharge green and chunky(a lot), no smell, itching and irritation coming back. Patient wants to know if she can get treatment for both yeast and BV. Advised would send request- will call her if she needs to have appointment.  Reason for Disposition . [1] Symptoms of a yeast infection (i.e., itchy, white discharge, not bad smelling) AND [2] not improved > 3 days following CARE ADVICE  Answer Assessment - Initial Assessment Questions 1. SYMPTOM: "What's the main symptom you're concerned about?" (e.g., pain, itching, dryness)     Discharge, itching irritation 2. LOCATION: "Where is the itching located?" (e.g., inside/outside, left/right)     Vulva and vagina 3. ONSET: "When did the  itching  start?"     3 weeks- OTC treatment helped 4. PAIN: "Is there any pain?" If Yes, ask: "How bad is it?" (Scale: 1-10; mild, moderate, severe)     no 5. ITCHING: "Is there any itching?" If Yes, ask: "How bad is it?" (Scale: 1-10; mild, moderate, severe)     Yes- moderate 6. CAUSE: "What do you think is causing the discharge?" "Have you had the same problem before? What happened then?"     Yeast- possible BV 7. OTHER SYMPTOMS: "Do you have any other symptoms?" (e.g., fever, itching, vaginal bleeding, pain with urination, injury to genital area, vaginal foreign body)     Itching, irritation, discharge 8. PREGNANCY: "Is there any chance you are pregnant?" "When was your last menstrual period?"     n/a  Protocols used: VAGINAL [facility]

## 2020-07-31 ENCOUNTER — Encounter: Payer: Self-pay | Admitting: Family Medicine

## 2020-07-31 ENCOUNTER — Ambulatory Visit (INDEPENDENT_AMBULATORY_CARE_PROVIDER_SITE_OTHER): Payer: BC Managed Care – PPO | Admitting: Family Medicine

## 2020-07-31 ENCOUNTER — Other Ambulatory Visit: Payer: Self-pay

## 2020-07-31 VITALS — BP 96/64 | HR 62 | Temp 99.3°F | Wt 158.0 lb

## 2020-07-31 DIAGNOSIS — N898 Other specified noninflammatory disorders of vagina: Secondary | ICD-10-CM | POA: Diagnosis not present

## 2020-07-31 DIAGNOSIS — N3001 Acute cystitis with hematuria: Secondary | ICD-10-CM | POA: Diagnosis not present

## 2020-07-31 LAB — MICROSCOPIC EXAMINATION

## 2020-07-31 LAB — URINALYSIS, ROUTINE W REFLEX MICROSCOPIC
Bilirubin, UA: NEGATIVE
Glucose, UA: NEGATIVE
Ketones, UA: NEGATIVE
Nitrite, UA: NEGATIVE
Specific Gravity, UA: >1.03 — ABNORMAL HIGH (ref 1.005–1.030)
Urobilinogen, Ur: 0.2 mg/dL (ref 0.2–1.0)
pH, UA: 5.5 (ref 5.0–7.5)

## 2020-07-31 LAB — WET PREP FOR TRICH, YEAST, CLUE
Clue Cell Exam: NEGATIVE
Trichomonas Exam: NEGATIVE
Yeast Exam: NEGATIVE

## 2020-07-31 MED ORDER — FLUCONAZOLE 150 MG PO TABS: 150.0000 mg | ORAL_TABLET | Freq: Once | ORAL | 0 refills | Status: AC

## 2020-07-31 MED ORDER — NITROFURANTOIN MONOHYD MACRO 100 MG PO CAPS: 100.0000 mg | ORAL_CAPSULE | Freq: Two times a day (BID) | ORAL | 0 refills | Status: DC

## 2020-07-31 NOTE — Progress Notes (Signed)
BP 96/64   Pulse 62   Temp 99.3 F (37.4 C)   Wt 158 lb (71.7 kg)   LMP  (LMP Unknown)   SpO2 97%   BMI 30.86 kg/m    Subjective:    Patient ID: Deanna Velez, female    DOB: 1960-03-23, 60 y.o.   MRN: [id number]  HPI: Deanna Velez is a 60 y.o. female  Chief Complaint  Patient presents with   Vaginal Discharge    Patient states she's had vaginal discharge for weeks, has been using OTC monistat. Patient has discharge and itching.    VAGINAL DISCHARGE Duration:  about a week Discharge description: cottage cheese  Pruritus: yes Dysuria: no Malodorous: no Urinary frequency: no Fevers: no Abdominal pain: no  History of sexually transmitted diseases: no Recent antibiotic use:  over a month ago Context: worse  Treatments attempted: antifungal  Relevant past medical, surgical, family and social history reviewed and updated as indicated. Interim medical history since our last visit reviewed. Allergies and medications reviewed and updated.  Review of Systems  Constitutional: Negative.   Respiratory: Negative.    Cardiovascular: Negative.   Gastrointestinal: Negative.   Genitourinary:  Positive for vaginal discharge. Negative for decreased urine volume, difficulty urinating, dyspareunia, dysuria, enuresis, flank pain, frequency, genital sores, hematuria, menstrual problem, pelvic pain, urgency, vaginal bleeding and vaginal pain.  Musculoskeletal: Negative.   Psychiatric/Behavioral: Negative.     Per HPI unless specifically indicated above     Objective:    BP 96/64   Pulse 62   Temp 99.3 F (37.4 C)   Wt 158 lb (71.7 kg)   LMP  (LMP Unknown)   SpO2 97%   BMI 30.86 kg/m   Wt Readings from Last 3 Encounters:  07/31/20 158 lb (71.7 kg)  05/25/20 163 lb 12.8 oz (74.3 kg)  10/17/19 179 lb (81.2 kg)    Physical Exam Vitals and nursing note reviewed.  Constitutional:      General: She is not in acute distress.    Appearance: Normal appearance. She is not  ill-appearing, toxic-appearing or diaphoretic.  HENT:     Head: Normocephalic and atraumatic.     Right Ear: External ear normal.     Left Ear: External ear normal.     Nose: Nose normal.     Mouth/Throat:     Mouth: Mucous membranes are moist.     Pharynx: Oropharynx is clear.  Eyes:     General: No scleral icterus.       Right eye: No discharge.        Left eye: No discharge.     Extraocular Movements: Extraocular movements intact.     Conjunctiva/sclera: Conjunctivae normal.     Pupils: Pupils are equal, round, and reactive to light.  Cardiovascular:     Rate and Rhythm: Normal rate and regular rhythm.     Pulses: Normal pulses.     Heart sounds: Normal heart sounds. No murmur heard.   No friction rub. No gallop.  Pulmonary:     Effort: Pulmonary effort is normal. No respiratory distress.     Breath sounds: Normal breath sounds. No stridor. No wheezing, rhonchi or rales.  Chest:     Chest wall: No tenderness.  Musculoskeletal:        General: Normal range of motion.     Cervical back: Normal range of motion and neck supple.  Skin:    General: Skin is warm and dry.     Capillary Refill: Capillary  refill takes less than 2 seconds.     Coloration: Skin is not jaundiced or pale.     Findings: No bruising, erythema, lesion or rash.  Neurological:     General: No focal deficit present.     Mental Status: She is alert and oriented to person, place, and time. Mental status is at baseline.  Psychiatric:        Mood and Affect: Mood normal.        Behavior: Behavior normal.        Thought Content: Thought content normal.        Judgment: Judgment normal.    Results for orders placed or performed in visit on 06/29/20  Cologuard  Result Value Ref Range   Cologuard Negative Negative      Assessment & Plan:   Problem List Items Addressed This Visit   None Visit Diagnoses     Acute cystitis with hematuria    -  Primary   Will treat with nitrofurantoin and await culture.  Call if not improving.    Relevant Orders   Urine Culture   Vaginal discharge       Wet prep clear. ?Partially treated due to monitstat- will treat with diflucan. Call if not getting better as will need pelvic- overdue for pap.   Relevant Orders   WET PREP FOR TRICH, YEAST, CLUE   Urinalysis, Routine w reflex microscopic        Follow up plan: No follow-ups on file.

## 2020-08-05 LAB — URINE CULTURE

## 2020-11-30 ENCOUNTER — Encounter: Payer: Self-pay | Admitting: General Surgery

## 2021-02-19 ENCOUNTER — Ambulatory Visit: Payer: Self-pay

## 2021-02-19 ENCOUNTER — Telehealth: Payer: BC Managed Care – PPO | Admitting: Nurse Practitioner

## 2021-02-19 ENCOUNTER — Encounter: Payer: Self-pay | Admitting: Nurse Practitioner

## 2021-02-19 DIAGNOSIS — U071 COVID-19: Secondary | ICD-10-CM

## 2021-02-19 MED ORDER — BENZONATATE 100 MG PO CAPS: 100.0000 mg | ORAL_CAPSULE | Freq: Two times a day (BID) | ORAL | 0 refills | Status: DC | PRN

## 2021-02-19 MED ORDER — MOLNUPIRAVIR EUA 200MG CAPSULE: 4.0000 | ORAL_CAPSULE | Freq: Two times a day (BID) | ORAL | 0 refills | Status: AC

## 2021-02-19 NOTE — Telephone Encounter (Signed)
°  Chief Complaint: COVID Symptoms: SOB, congestion, cough, fever Frequency: 1 day Pertinent Negatives: NA Disposition: [] ED /[] Urgent Care (no appt availability in office) / [] Appointment(In office/virtual)/ [x]  [facility]/ [] Home Care/ [] Refused Recommended Disposition /[] [place] [facility]/ []  Follow-up with PCP Additional Notes: Pt started having chest tightness and SOB with cough yesterday, tested positive today for COVID. No availability in office or virtual so scheduled pt for virtual UC visit. Care advice given and pt verbalized understanding. No other questions/concerns noted.     Reason for Disposition  [1] Continuous (nonstop) coughing interferes with work or school AND [2] no improvement using cough treatment per Care Advice  Answer Assessment - Initial Assessment Questions 1. COVID-19 DIAGNOSIS: "Who made your COVID-19 diagnosis?" "Was it confirmed by a positive lab test or self-test?" If not diagnosed by a doctor (or NP/PA), ask "Are there lots of cases (community spread) where you live?" Note: See public health department website, if unsure.     Home test 3. ONSET: "When did the COVID-19 symptoms start?"      yesterday 5. COUGH: "Do you have a cough?" If Yes, ask: "How bad is the cough?"       yes 6. FEVER: "Do you have a fever?" If Yes, ask: "What is your temperature, how was it measured, and when did it start?"     yes 7. RESPIRATORY STATUS: "Describe your breathing?" (e.g., shortness of breath, wheezing, unable to speak)      SOB 10.VACCINE: "Have you had the COVID-19 vaccine?" If Yes, ask: "Which one, how many shots, when did you get it?"       Yes 11. BOOSTER: "Have you received your COVID-19 booster?" If Yes, ask: "Which one and when did you get it?"       yes 13. OTHER SYMPTOMS: "Do you have any other symptoms?"  (e.g., chills, fatigue, headache, loss of smell or taste, muscle pain, sore throat)       Dry cough  Protocols used: Coronavirus  (COVID-19) Diagnosed or Suspected-A-AH

## 2021-02-19 NOTE — Progress Notes (Signed)
Virtual Visit Consent   Deanna Velez, you are scheduled for a virtual visit with a [facility] provider today.     Just as with appointments in the office, your consent must be obtained to participate.  Your consent will be active for this visit and any virtual visit you may have with one of our providers in the next 365 days.     If you have a MyChart account, a copy of this consent can be sent to you electronically.  All virtual visits are billed to your insurance company just like a traditional visit in the office.    As this is a virtual visit, video technology does not allow for your provider to perform a traditional examination.  This may limit your provider's ability to fully assess your condition.  If your provider identifies any concerns that need to be evaluated in person or the need to arrange testing (such as labs, EKG, etc.), we will make arrangements to do so.     Although advances in technology are sophisticated, we cannot ensure that it will always work on either your end or our end.  If the connection with a video visit is poor, the visit may have to be switched to a telephone visit.  With either a video or telephone visit, we are not always able to ensure that we have a secure connection.     I need to obtain your verbal consent now.   Are you willing to proceed with your visit today?    Braxton Weisbecker has provided verbal consent on 02/19/2021 for a virtual visit  (telephone).   Apolonio Schneiders, FNP   Date: 02/19/2021 5:37 PM   Virtual Visit via Video Note   I, Apolonio Schneiders, connected with  Deanna Velez  ([id number], 1960-04-30) on 02/19/21 at  5:30 PM EST by a video-enabled telemedicine application and verified that I am speaking with the correct person using two identifiers.  Location: Patient: Virtual Visit Location Patient: Home Provider: Virtual Visit Location Provider: Home Office   I discussed the limitations of evaluation and management by telemedicine and the availability  of in person appointments. The patient expressed understanding and agreed to proceed.    History of Present Illness: Deanna Velez is a 60 y.o. who identifies as a female who was assigned female at birth, and is being seen today after testing positive for COVID at home.  She started to have a cough, some SOB and a fever for the past 24 hours.    She has had COVID in the past without complications.  She has emphysema has used inhalers in the past but not always   She is a former smoker.   She has been vaccinated x3 for COVID   She has been using Zycam so far for symptom relief so far    GFR is 102  Denies any history of kidney disease   Problems:  Patient Active Problem List   Diagnosis Date Noted   Acute non-recurrent frontal sinusitis 06/12/2020   Lesion of tongue 05/25/2020   Obesity 05/25/2020   Elevated alkaline phosphatase level 01/19/2020   Elevated liver enzymes 01/19/2020   Fatty liver 07/09/2019   Centrilobular emphysema ([place]) 09/07/2018   Former smoker, stopped smoking many years ago 03/23/2018   Allergic rhinitis 08/18/2016   Hypertension 02/27/2015   Pelvic pain in female 01/21/2015    Allergies:  Allergies  Allergen Reactions   Other Anaphylaxis    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)  Beef-Derived Products    Pork-Derived Products    Medications:  Current Outpatient Medications:    amLODipine (NORVASC) 5 MG tablet, Take 1 tablet (5 mg total) by mouth daily., Disp: 90 tablet, Rfl: 4   EPINEPHrine (EPIPEN 2-PAK) 0.3 mg/0.3 mL IJ SOAJ injection, Inject 0.3 mLs (0.3 mg total) into the muscle as needed for anaphylaxis., Disp: 2 each, Rfl: 1   EPINEPHrine (EPIPEN IJ), Inject as directed., Disp: , Rfl:    losartan (COZAAR) 50 MG tablet, Take 1 tablet (50 mg total) by mouth daily., Disp: 90 tablet, Rfl: 4   nitrofurantoin, macrocrystal-monohydrate, (MACROBID) 100 MG capsule, Take 1 capsule (100 mg total) by mouth 2 (two) times daily., Disp: 14 capsule,  Rfl: 0  Observations/Objective: Unable to connect to video visit was performed by telephone  Patient is in no acute distress during phone conversation  No labored breathing.  Speech is clear and coherent with logical content.  Patient is alert and oriented at baseline.    Assessment and Plan: 1. COVID-19 Take anti-viral medication with food   - molnupiravir EUA (LAGEVRIO) 200 mg CAPS capsule; Take 4 capsules (800 mg total) by mouth 2 (two) times daily for 5 days.  Dispense: 40 capsule; Refill: 0    Use albuterol as directed.  May continue ibuprofen for headache and fever relief.    Follow Up Instructions: I discussed the assessment and treatment plan with the patient. The patient was provided an opportunity to ask questions and all were answered. The patient agreed with the plan and demonstrated an understanding of the instructions.  A copy of instructions were sent to the patient via MyChart unless otherwise noted below.     The patient was advised to call back or seek an in-person evaluation if the symptoms worsen or if the condition fails to improve as anticipated.  Time:  I spent 10 minutes with the patient via telehealth technology discussing the above problems/concerns.    Apolonio Schneiders, FNP

## 2021-03-24 ENCOUNTER — Other Ambulatory Visit: Payer: Self-pay | Admitting: *Deleted

## 2021-03-24 DIAGNOSIS — Z87891 Personal history of nicotine dependence: Secondary | ICD-10-CM

## 2021-04-19 ENCOUNTER — Ambulatory Visit: Admission: RE | Admit: 2021-04-19 | Payer: BC Managed Care – PPO | Source: Ambulatory Visit

## 2021-05-03 ENCOUNTER — Ambulatory Visit
Admission: RE | Admit: 2021-05-03 | Discharge: 2021-05-03 | Disposition: A | Discharge status: Home / Self Care | Payer: BC Managed Care – PPO | Source: Ambulatory Visit | Attending: Acute Care | Admitting: Acute Care

## 2021-05-03 ENCOUNTER — Other Ambulatory Visit: Payer: Self-pay

## 2021-05-03 DIAGNOSIS — Z87891 Personal history of nicotine dependence: Secondary | ICD-10-CM | POA: Diagnosis present

## 2021-05-05 ENCOUNTER — Other Ambulatory Visit: Payer: Self-pay

## 2021-05-05 DIAGNOSIS — Z87891 Personal history of nicotine dependence: Secondary | ICD-10-CM

## 2021-06-02 ENCOUNTER — Ambulatory Visit: Payer: BC Managed Care – PPO | Admitting: Nurse Practitioner

## 2021-06-02 ENCOUNTER — Encounter: Payer: Self-pay | Admitting: Nurse Practitioner

## 2021-06-02 VITALS — BP 101/63 | HR 67 | Temp 98.3°F | Wt 160.8 lb

## 2021-06-02 DIAGNOSIS — R748 Abnormal levels of other serum enzymes: Secondary | ICD-10-CM

## 2021-06-02 DIAGNOSIS — F1010 Alcohol abuse, uncomplicated: Secondary | ICD-10-CM | POA: Diagnosis not present

## 2021-06-02 DIAGNOSIS — E6609 Other obesity due to excess calories: Secondary | ICD-10-CM

## 2021-06-02 DIAGNOSIS — K760 Fatty (change of) liver, not elsewhere classified: Secondary | ICD-10-CM | POA: Diagnosis not present

## 2021-06-02 DIAGNOSIS — R1084 Generalized abdominal pain: Secondary | ICD-10-CM | POA: Insufficient documentation

## 2021-06-02 DIAGNOSIS — Z6831 Body mass index (BMI) 31.0-31.9, adult: Secondary | ICD-10-CM

## 2021-06-02 LAB — WET PREP FOR TRICH, YEAST, CLUE
Clue Cell Exam: NEGATIVE
Trichomonas Exam: NEGATIVE
Yeast Exam: NEGATIVE

## 2021-06-02 LAB — URINALYSIS, ROUTINE W REFLEX MICROSCOPIC
Bilirubin, UA: NEGATIVE
Glucose, UA: NEGATIVE
Ketones, UA: NEGATIVE
Nitrite, UA: NEGATIVE
Protein,UA: NEGATIVE
RBC, UA: NEGATIVE
Specific Gravity, UA: 1.015 (ref 1.005–1.030)
Urobilinogen, Ur: 0.2 mg/dL (ref 0.2–1.0)
pH, UA: 5.5 (ref 5.0–7.5)

## 2021-06-02 LAB — MICROSCOPIC EXAMINATION
Bacteria, UA: NONE SEEN
RBC, Urine: NONE SEEN /hpf (ref 0–2)

## 2021-06-02 MED ORDER — IBUPROFEN 800 MG PO TABS: 800.0000 mg | ORAL_TABLET | Freq: Three times a day (TID) | ORAL | 0 refills | Status: DC | PRN

## 2021-06-02 NOTE — Progress Notes (Signed)
? ?BP 101/63   Pulse 67   Temp 98.3 ?F (36.8 ?C) (Oral)   Wt 160 lb 12.8 oz (72.9 kg)   LMP  (LMP Unknown)   SpO2 98%   BMI 31.40 kg/m?   ? ?Subjective:  ? ? Patient ID: Deanna Velez, female    DOB: 1961-02-08, 61 y.o.   MRN: [id number] ? ?HPI: ?Deanna Velez is a 61 y.o. female ? ?Chief Complaint  ?Patient presents with  ? Abdominal Pain  ?  Patient is here for Abdominal Issues. Patient states Monday says she woke up with a lot of pressure and pain in her lower abdomen (gut). Patient states she feels like she has to go. Patient states Tuesday it was her lower abdomen and lower back. Patient states yesterday she could feel the gas moving along in her stomach. Patient states today her pain in her lower abdomen is gone, but she still has a dull lower back pain. Patient states she has taking a lot of Ibuprofen.  ? ?ABDOMINAL PAIN  ?Presents today for abdominal issues -- last visit she had a urine infection she did not know about and was treated.  Woke-up with symptoms that started Monday with pain in lower abdomen, radiating around to front.  Hurt more with BM.  Felt lots of pressure like she had to have a BM.  On normal basis she has BM 2-3 times a day, does not need to strain.  Has been taking Ibuprofen and this has been helping with pain.  Today the pain is gone.  Did Cologuard 06/20/20 this was negative.   ? ?She is not a current smoker, quit 3 years ago.  Has stopped drinking alcohol at home -- was drinking about 6-8 beer as night, has drank like this the last couple years. ? ?Last LFTs April 2022 noted AST/ALT 180/102.  Recent lung screening noted possible cirrhosis. ?Duration:days ?Onset: sudden ?Severity: 8/10 on Monday, currently 2/10 ?Quality: dull, aching, and throbbing ?Location:  lower abdomen and radiating around to back  ?Episode duration: sharp stab pain lasted about 30 minutes, then turns into dull aching ?Radiation: yes ?Frequency: constant ?Alleviating factors: Ibuprofen ?Aggravating factors: ?Status:  fluctuating ?Treatments attempted: Ibuprofen ?Fever: no ?Nausea: no ?Vomiting: no ?Weight loss: no ?Decreased appetite: yes ?Diarrhea: yes a little today ?Constipation: no ?Blood in stool: no ?Heartburn: no ?Jaundice: no ?Rash: no ?Dysuria/urinary frequency: no ?Hematuria: no ?History of sexually transmitted disease: no ?Recurrent NSAID use: just recently for pain ? ?Relevant past medical, surgical, family and social history reviewed and updated as indicated. Interim medical history since our last visit reviewed. ?Allergies and medications reviewed and updated. ? ?Review of Systems  ?Constitutional:  Positive for appetite change. Negative for activity change, diaphoresis, fatigue and fever.  ?Respiratory:  Negative for cough, chest tightness, shortness of breath and wheezing.   ?Cardiovascular:  Negative for chest pain, palpitations and leg swelling.  ?Gastrointestinal:  Positive for abdominal distention, abdominal pain and diarrhea. Negative for blood in stool, constipation, nausea, rectal pain and vomiting.  ?Endocrine: Negative.   ?Neurological: Negative.   ?Psychiatric/Behavioral: Negative.    ? ?Per HPI unless specifically indicated above ? ?   ?Objective:  ?  ?BP 101/63   Pulse 67   Temp 98.3 ?F (36.8 ?C) (Oral)   Wt 160 lb 12.8 oz (72.9 kg)   LMP  (LMP Unknown)   SpO2 98%   BMI 31.40 kg/m?   ?Wt Readings from Last 3 Encounters:  ?06/02/21 160 lb 12.8 oz (72.9 kg)  ?  07/31/20 158 lb (71.7 kg)  ?05/25/20 163 lb 12.8 oz (74.3 kg)  ?  ?Physical Exam ?Vitals and nursing note reviewed.  ?Constitutional:   ?   General: She is awake. She is not in acute distress. ?   Appearance: She is well-developed and well-groomed. She is obese. She is not ill-appearing or toxic-appearing.  ?HENT:  ?   Head: Normocephalic.  ?   Right Ear: Hearing normal.  ?   Left Ear: Hearing normal.  ?Eyes:  ?   General: Lids are normal.     ?   Right eye: No discharge.     ?   Left eye: No discharge.  ?   Conjunctiva/sclera: Conjunctivae  normal.  ?   Pupils: Pupils are equal, round, and reactive to light.  ?Neck:  ?   Thyroid: No thyromegaly.  ?   Vascular: No carotid bruit.  ?Cardiovascular:  ?   Rate and Rhythm: Normal rate and regular rhythm.  ?   Heart sounds: Normal heart sounds. No murmur heard. ?  No gallop.  ?Pulmonary:  ?   Effort: Pulmonary effort is normal.  ?   Breath sounds: Normal breath sounds.  ?Abdominal:  ?   General: Bowel sounds are normal. There is no distension.  ?   Palpations: Abdomen is soft. There is no hepatomegaly.  ?   Tenderness: There is abdominal tenderness in the suprapubic area and left lower quadrant. There is no right CVA tenderness, left CVA tenderness, guarding or rebound. Negative signs include Murphy's sign.  ?   Hernia: No hernia is present.  ?Musculoskeletal:  ?   Cervical back: Normal range of motion and neck supple.  ?   Right lower leg: No edema.  ?   Left lower leg: No edema.  ?Lymphadenopathy:  ?   Cervical: No cervical adenopathy.  ?Skin: ?   General: Skin is warm and dry.  ?Neurological:  ?   Mental Status: She is alert and oriented to person, place, and time.  ?Psychiatric:     ?   Attention and Perception: Attention normal.     ?   Mood and Affect: Mood normal.     ?   Speech: Speech normal.     ?   Behavior: Behavior normal. Behavior is cooperative.     ?   Thought Content: Thought content normal.  ? ? ?Results for orders placed or performed in visit on 06/02/21  ?Microscopic Examination  ?Result Value Ref Range  ? WBC, UA 0-5 0 - 5 /hpf  ? RBC None seen 0 - 2 /hpf  ? Epithelial Cells (non renal) 0-10 0 - 10 /hpf  ? Mucus, UA Present (A) Not Estab.  ? Bacteria, UA None seen None seen/Few  ?WET PREP FOR [place], YEAST, CLUE  ? Urine  ?Result Value Ref Range  ? Trichomonas Exam Negative Negative  ? Yeast Exam Negative Negative  ? Clue Cell Exam Negative Negative  ?Urinalysis, Routine w reflex microscopic  ?Result Value Ref Range  ? Specific Gravity, UA 1.015 1.005 - 1.030  ? pH, UA 5.5 5.0 - 7.5  ?  Color, UA Yellow Yellow  ? Appearance Ur Clear Clear  ? Leukocytes,UA Trace (A) Negative  ? Protein,UA Negative Negative/Trace  ? Glucose, UA Negative Negative  ? Ketones, UA Negative Negative  ? RBC, UA Negative Negative  ? Bilirubin, UA Negative Negative  ? Urobilinogen, Ur 0.2 0.2 - 1.0 mg/dL  ? Nitrite, UA Negative Negative  ? Microscopic Examination  See below:   ? ?   ?Assessment & Plan:  ? ?Problem List Items Addressed This Visit   ? ?  ? Digestive  ? Fatty liver  ?  Refer to elevated liver enzyme plan of care. ?  ?  ?  ? Other  ? Alcohol abuse  ?  Reports she has quit at this time, but for past 2 years she does endorse drinking 6-8 beers a night.  Have recommended ongoing cessation of alcohol use.  Labs today. ?  ?  ? Elevated liver enzymes  ?  Noted on labs May 2021 and April 2022 with calculated R Factor 3.0 (mixed) and fatty liver noted on past imaging + questionable cirrhosis on recent CT.  Recheck CMP today and will obtain repeat liver ultrasound to assess, she prefers this vs CT abdomen due to costs.  Recommend minimal Tylenol and complete cessation of alcohol use. ?  ?  ? Generalized abdominal pain - Primary  ?  Acute starting on Monday to lower abdomen, generalized -- improving today.  Has been taking Ibuprofen.  UA trace Leuks and no bacteria, wet prep negative.  ?gastroenteritis.  At this time will obtain CBC and CMP to further assess.  Refills on Ibuprofen per request, however recommend she not take this very often.  Focus on BLAND diet until symptoms completely improved.  Will obtain repeat u/s liver, last was 2020.  Recommend ongoing cessation from alcohol use. ?  ?  ? Relevant Orders  ? Urinalysis, Routine w reflex microscopic (Completed)  ? CBC with Differential/Platelet  ? Comprehensive metabolic panel  ? Urine Culture  ? US Abdomen Limited RUQ (LIVER/GB)  ? Obesity  ?  BMI 31.40.  Recommended eating smaller high protein, low fat meals more frequently and exercising 30 mins a day 5 times a  week with a goal of 10-15lb weight loss in the next 3 months. Patient voiced their understanding and motivation to adhere to these recommendations. ? ?  ?  ?  ? ?Follow up plan: ?Return in about 1 week (around 4/19/202

## 2021-06-02 NOTE — Assessment & Plan Note (Signed)
Refer to elevated liver enzyme plan of care. ?

## 2021-06-02 NOTE — Assessment & Plan Note (Signed)
Noted on labs May 2021 and April 2022 with calculated R Factor 3.0 (mixed) and fatty liver noted on past imaging + questionable cirrhosis on recent CT.  Recheck CMP today and will obtain repeat liver ultrasound to assess, she prefers this vs CT abdomen due to costs.  Recommend minimal Tylenol and complete cessation of alcohol use. ?

## 2021-06-02 NOTE — Patient Instructions (Signed)
Abdominal Pain, Adult Many things can cause belly (abdominal) pain. Most times, belly pain is not dangerous. Many cases of belly pain can be watched and treated at home. Sometimes, though, belly pain is serious. Yourdoctor will try to find the cause of your belly pain. Follow these instructions at home:  Medicines Take over-the-counter and prescription medicines only as told by your doctor. Do not take medicines that help you poop (laxatives) unless told by your doctor. General instructions Watch your belly pain for any changes. Drink enough fluid to keep your pee (urine) pale yellow. Keep all follow-up visits as told by your doctor. This is important. Contact a doctor if: Your belly pain changes or gets worse. You are not hungry, or you lose weight without trying. You are having trouble pooping (constipated) or have watery poop (diarrhea) for more than 2-3 days. You have pain when you pee or poop. Your belly pain wakes you up at night. Your pain gets worse with meals, after eating, or with certain foods. You are vomiting and cannot keep anything down. You have a fever. You have blood in your pee. Get help right away if: Your pain does not go away as soon as your doctor says it should. You cannot stop vomiting. Your pain is only in areas of your belly, such as the right side or the left lower part of the belly. You have bloody or black poop, or poop that looks like tar. You have very bad pain, cramping, or bloating in your belly. You have signs of not having enough fluid or water in your body (dehydration), such as: Dark pee, very little pee, or no pee. Cracked lips. Dry mouth. Sunken eyes. Sleepiness. Weakness. You have trouble breathing or chest pain. Summary Many cases of belly pain can be watched and treated at home. Watch your belly pain for any changes. Take over-the-counter and prescription medicines only as told by your doctor. Contact a doctor if your belly pain  changes or gets worse. Get help right away if you have very bad pain, cramping, or bloating in your belly. This information is not intended to replace advice given to you by your health care provider. Make sure you discuss any questions you have with your healthcare provider. Document Revised: 06/18/2018 Document Reviewed: 06/18/2018 Elsevier Patient Education  2022 Elsevier Inc.  

## 2021-06-02 NOTE — Assessment & Plan Note (Signed)
Reports she has quit at this time, but for past 2 years she does endorse drinking 6-8 beers a night.  Have recommended ongoing cessation of alcohol use.  Labs today. ?

## 2021-06-02 NOTE — Assessment & Plan Note (Signed)
BMI 31.40.  Recommended eating smaller high protein, low fat meals more frequently and exercising 30 mins a day 5 times a week with a goal of 10-15lb weight loss in the next 3 months. Patient voiced their understanding and motivation to adhere to these recommendations. ? ?

## 2021-06-02 NOTE — Assessment & Plan Note (Signed)
Acute starting on Monday to lower abdomen, generalized -- improving today.  Has been taking Ibuprofen.  UA trace Leuks and no bacteria, wet prep negative.  ?gastroenteritis.  At this time will obtain CBC and CMP to further assess.  Refills on Ibuprofen per request, however recommend she not take this very often.  Focus on BLAND diet until symptoms completely improved.  Will obtain repeat u/s liver, last was 2020.  Recommend ongoing cessation from alcohol use. ?

## 2021-06-03 LAB — CBC WITH DIFFERENTIAL/PLATELET
Basophils Absolute: 0 10*3/uL (ref 0.0–0.2)
Basos: 1 %
EOS (ABSOLUTE): 0.1 10*3/uL (ref 0.0–0.4)
Eos: 1 %
Hematocrit: 39.9 % (ref 34.0–46.6)
Hemoglobin: 13.2 g/dL (ref 11.1–15.9)
Immature Grans (Abs): 0 10*3/uL (ref 0.0–0.1)
Immature Granulocytes: 0 %
Lymphocytes Absolute: 1.8 10*3/uL (ref 0.7–3.1)
Lymphs: 25 %
MCH: 30.6 pg (ref 26.6–33.0)
MCHC: 33.1 g/dL (ref 31.5–35.7)
MCV: 92 fL (ref 79–97)
Monocytes Absolute: 0.4 10*3/uL (ref 0.1–0.9)
Monocytes: 6 %
Neutrophils Absolute: 4.8 10*3/uL (ref 1.4–7.0)
Neutrophils: 67 %
Platelets: 224 10*3/uL (ref 150–450)
RBC: 4.32 x10E6/uL (ref 3.77–5.28)
RDW: 12.3 % (ref 11.7–15.4)
WBC: 7.1 10*3/uL (ref 3.4–10.8)

## 2021-06-03 LAB — COMPREHENSIVE METABOLIC PANEL
ALT: 54 IU/L — ABNORMAL HIGH (ref 0–32)
AST: 42 IU/L — ABNORMAL HIGH (ref 0–40)
Albumin/Globulin Ratio: 1.6 (ref 1.2–2.2)
Albumin: 4.7 g/dL (ref 3.8–4.9)
Alkaline Phosphatase: 130 IU/L — ABNORMAL HIGH (ref 44–121)
BUN/Creatinine Ratio: 10 (ref 9–23)
BUN: 7 mg/dL (ref 6–24)
Bilirubin Total: 0.8 mg/dL (ref 0.0–1.2)
CO2: 20 mmol/L (ref 20–29)
Calcium: 10.2 mg/dL (ref 8.7–10.2)
Chloride: 106 mmol/L (ref 96–106)
Creatinine, Ser: 0.67 mg/dL (ref 0.57–1.00)
Globulin, Total: 2.9 g/dL (ref 1.5–4.5)
Glucose: 88 mg/dL (ref 70–99)
Potassium: 4.2 mmol/L (ref 3.5–5.2)
Sodium: 141 mmol/L (ref 134–144)
Total Protein: 7.6 g/dL (ref 6.0–8.5)
eGFR: 101 mL/min/{1.73_m2} (ref >59)

## 2021-06-03 NOTE — Progress Notes (Signed)
Contacted via [place] ? ? ?Good evening Deanna Velez, your labs have returned.  - Kidney function, creatinine and eGFR, remains normal. Liver function, AST and ALT, were previously 180 and 102, these have come down to 42 and 54 with reduction in alcohol use.  Please continue to avoid alcohol use or reduce + avoid use of Tylenol at this time.  CBC shows no elevations concerning for infection.  I see you are scheduled for ultrasound on 06/10/21, keep this appointment so we can further assess liver.  Any questions? ?Keep being stellar!!  Thank you for allowing me to participate in your care.  I appreciate you. ?Kindest regards, ?Dilpreet Faires ?

## 2021-06-04 LAB — URINE CULTURE

## 2021-06-10 ENCOUNTER — Ambulatory Visit
Admission: RE | Admit: 2021-06-10 | Discharge: 2021-06-10 | Disposition: A | Discharge status: Home / Self Care | Payer: BC Managed Care – PPO | Source: Ambulatory Visit | Attending: Nurse Practitioner | Admitting: Nurse Practitioner

## 2021-06-10 DIAGNOSIS — R1084 Generalized abdominal pain: Secondary | ICD-10-CM | POA: Diagnosis present

## 2021-06-11 NOTE — Progress Notes (Signed)
Contacted via [place] ? ? ?Good evening Deanna Velez, your imaging has returned.  There are no gallstones noted.  There is some mild fatty liver disease noted, for this I do recommend heavy focus on alcohol reduction -- working towards complete cessation (stopping) of use.  I also recommend avoid Tylenol use unless needed, only small dose.  Focus on health diet changes, more fruits and vegetables -- less fast food, processed foods.  Any questions?  If liver function testing remains elevated on future labs you may benefit from visit with a GI doctor. ?Keep being stellar!!  Thank you for allowing me to participate in your care.  I appreciate you. ?Kindest regards, ?Desiree Fleming ?

## 2021-06-19 NOTE — Patient Instructions (Addendum)
Stop Amlodipine and return in 6 weeks for BP check and visit. ? ?Fatty Liver Disease ? ?The liver converts food into energy, removes toxic material from the blood, makes important proteins, and absorbs necessary vitamins from food. Fatty liver disease occurs when too much fat has built up in your liver cells. Fatty liver disease is also called hepatic steatosis. ?In many cases, fatty liver disease does not cause symptoms or problems. It is often diagnosed when tests are being done for other reasons. However, over time, fatty liver can cause inflammation that may lead to more serious liver problems, such as scarring of the liver (cirrhosis) and liver failure. ?Fatty liver is associated with insulin resistance, increased body fat, high blood pressure (hypertension), and high cholesterol. These are features of metabolic syndrome and increase your risk for stroke, diabetes, and heart disease. ?What are the causes? ?This condition may be caused by components of metabolic syndrome: ?Obesity. ?Insulin resistance. ?High cholesterol. ?Other causes: ?Alcohol abuse. ?Poor nutrition. ?Cushing syndrome. ?Pregnancy. ?Certain drugs. ?Poisons. ?Some viral infections. ?What increases the risk? ?You are more likely to develop this condition if you: ?Abuse alcohol. ?Are overweight. ?Have diabetes. ?Have hepatitis. ?Have a high triglyceride level. ?Are pregnant. ?What are the signs or symptoms? ?Fatty liver disease often does not cause symptoms. If symptoms do develop, they can include: ?Fatigue and weakness. ?Weight loss. ?Confusion. ?Nausea, vomiting, or abdominal pain. ?Yellowing of your skin and the white parts of your eyes (jaundice). ?Itchy skin. ?How is this diagnosed? ?This condition may be diagnosed by: ?A physical exam and your medical history. ?Blood tests. ?Imaging tests, such as an ultrasound, CT scan, or MRI. ?A liver biopsy. A small sample of liver tissue is removed using a needle. The sample is then looked at under a  microscope. ?How is this treated? ?Fatty liver disease is often caused by other health conditions. Treatment for fatty liver may involve medicines and lifestyle changes to manage conditions such as: ?Alcoholism. ?High cholesterol. ?Diabetes. ?Being overweight or obese. ?Follow these instructions at home: ? ?Do not drink alcohol. If you have trouble quitting, ask your health care provider how to safely quit with the help of medicine or a supervised program. This is important to keep your condition from getting worse. ?Eat a healthy diet as told by your health care provider. Ask your health care provider about working with a dietitian to develop an eating plan. ?Exercise regularly. This can help you lose weight and control your cholesterol and diabetes. Talk to your health care provider about an exercise plan and which activities are best for you. ?Take over-the-counter and prescription medicines only as told by your health care provider. ?Keep all follow-up visits. This is important. ?Contact a health care provider if: ?You have trouble controlling your: ?Blood sugar. This is especially important if you have diabetes. ?Cholesterol. ?Drinking of alcohol. ?Get help right away if: ?You have abdominal pain. ?You have jaundice. ?You have nausea and are vomiting. ?You vomit blood or material that looks like coffee grounds. ?You have stools that are black, tar-like, or bloody. ?Summary ?Fatty liver disease develops when too much fat builds up in the cells of your liver. ?Fatty liver disease often causes no symptoms or problems. However, over time, fatty liver can cause inflammation that may lead to more serious liver problems, such as scarring of the liver (cirrhosis). ?You are more likely to develop this condition if you abuse alcohol, are pregnant, are overweight, have diabetes, have hepatitis, or have high triglyceride or cholesterol  levels. ?Contact your health care provider if you have trouble controlling your blood  sugar, cholesterol, or drinking of alcohol. ?This information is not intended to replace advice given to you by your health care provider. Make sure you discuss any questions you have with your health care provider. ?Document Revised: 11/21/2019 Document Reviewed: 11/21/2019 ?Elsevier Patient Education ? [place]. ? ?

## 2021-06-21 ENCOUNTER — Ambulatory Visit: Payer: BC Managed Care – PPO | Admitting: Nurse Practitioner

## 2021-06-22 ENCOUNTER — Encounter: Payer: Self-pay | Admitting: Nurse Practitioner

## 2021-06-22 ENCOUNTER — Ambulatory Visit: Payer: BC Managed Care – PPO | Admitting: Nurse Practitioner

## 2021-06-22 VITALS — BP 98/64 | HR 61 | Temp 98.6°F | Wt 159.2 lb

## 2021-06-22 DIAGNOSIS — R748 Abnormal levels of other serum enzymes: Secondary | ICD-10-CM

## 2021-06-22 DIAGNOSIS — I10 Essential (primary) hypertension: Secondary | ICD-10-CM | POA: Diagnosis not present

## 2021-06-22 DIAGNOSIS — R1084 Generalized abdominal pain: Secondary | ICD-10-CM

## 2021-06-22 DIAGNOSIS — F1010 Alcohol abuse, uncomplicated: Secondary | ICD-10-CM | POA: Diagnosis not present

## 2021-06-22 DIAGNOSIS — K760 Fatty (change of) liver, not elsewhere classified: Secondary | ICD-10-CM | POA: Diagnosis not present

## 2021-06-22 NOTE — Assessment & Plan Note (Addendum)
Ongoing on u/s noted.  Recommend continued cessation of alcohol intake and focus on health diet at home, reducing processed foods.  Recheck labs today. 

## 2021-06-22 NOTE — Progress Notes (Signed)
? ?BP 98/64   Pulse 61   Temp 98.6 ?F (37 ?C) (Oral)   Wt 159 lb 3.2 oz (72.2 kg)   LMP  (LMP Unknown)   SpO2 96%   BMI 31.09 kg/m?   ? ?Subjective:  ? ? Patient ID: Deanna Velez, female    DOB: 17-Feb-1961, 61 y.o.   MRN: [id number] ? ?HPI: ?Deanna Velez is a 61 y.o. female ? ?Chief Complaint  ?Patient presents with  ? Abdominal Pain  ? Alcohol Problem  ? Elevated Hepatic Enzymes  ? Fatty Liver  ? ?ABDOMINAL PAIN  ?Presents today for follow-up on abdominal issues, recent u/s imaging noted no acute concerns, but mild hepatic steatosis noted.  Labs showed AST/ALT 42/54, this was downward trend from 180/102 one year ago. Did Cologuard 06/20/20 this was negative.  Her abdominal pain has improved at this time. ? ?She is not a current smoker, quit 3 years ago.  Has stopped drinking alcohol at home (whole month of April did not drink any alcohol) -- was drinking about 6-8 beer as night, has drank like this the last couple years. ?Status: improved ?Treatments attempted: Ibuprofen ?Fever: no ?Nausea: no ?Vomiting: no ?Weight loss: no ?Decreased appetite: improved ?Diarrhea: none ?Constipation: no ?Blood in stool: no ?Heartburn: no ?Jaundice: no ?Rash: no ?Dysuria/urinary frequency: no ?Hematuria: no ?History of sexually transmitted disease: no ?Recurrent NSAID use: just recently for pain ? ?Relevant past medical, surgical, family and social history reviewed and updated as indicated. Interim medical history since our last visit reviewed. ?Allergies and medications reviewed and updated. ? ?Review of Systems  ?Constitutional:  Negative for activity change, appetite change, diaphoresis, fatigue and fever.  ?Respiratory:  Negative for cough, chest tightness, shortness of breath and wheezing.   ?Cardiovascular:  Negative for chest pain, palpitations and leg swelling.  ?Gastrointestinal:  Negative for abdominal distention, abdominal pain, blood in stool, constipation, diarrhea, nausea, rectal pain and vomiting.  ?Endocrine:  Negative.   ?Neurological: Negative.   ?Psychiatric/Behavioral: Negative.    ? ?Per HPI unless specifically indicated above ? ?   ?Objective:  ?  ?BP 98/64   Pulse 61   Temp 98.6 ?F (37 ?C) (Oral)   Wt 159 lb 3.2 oz (72.2 kg)   LMP  (LMP Unknown)   SpO2 96%   BMI 31.09 kg/m?   ?Wt Readings from Last 3 Encounters:  ?06/22/21 159 lb 3.2 oz (72.2 kg)  ?06/02/21 160 lb 12.8 oz (72.9 kg)  ?07/31/20 158 lb (71.7 kg)  ?  ?Physical Exam ?Vitals and nursing note reviewed.  ?Constitutional:   ?   General: She is awake. She is not in acute distress. ?   Appearance: She is well-developed and well-groomed. She is obese. She is not ill-appearing or toxic-appearing.  ?HENT:  ?   Head: Normocephalic.  ?   Right Ear: Hearing normal.  ?   Left Ear: Hearing normal.  ?Eyes:  ?   General: Lids are normal.     ?   Right eye: No discharge.     ?   Left eye: No discharge.  ?   Conjunctiva/sclera: Conjunctivae normal.  ?   Pupils: Pupils are equal, round, and reactive to light.  ?Neck:  ?   Thyroid: No thyromegaly.  ?   Vascular: No carotid bruit.  ?Cardiovascular:  ?   Rate and Rhythm: Normal rate and regular rhythm.  ?   Heart sounds: Normal heart sounds. No murmur heard. ?  No gallop.  ?Pulmonary:  ?  Effort: Pulmonary effort is normal.  ?   Breath sounds: Normal breath sounds.  ?Abdominal:  ?   General: Bowel sounds are normal. There is no distension.  ?   Palpations: Abdomen is soft. There is no hepatomegaly.  ?   Tenderness: There is no abdominal tenderness. There is no right CVA tenderness, left CVA tenderness, guarding or rebound. Negative signs include Murphy's sign.  ?   Hernia: No hernia is present.  ?Musculoskeletal:  ?   Cervical back: Normal range of motion and neck supple.  ?   Right lower leg: No edema.  ?   Left lower leg: No edema.  ?Lymphadenopathy:  ?   Cervical: No cervical adenopathy.  ?Skin: ?   General: Skin is warm and dry.  ?Neurological:  ?   Mental Status: She is alert and oriented to person, place, and  time.  ?Psychiatric:     ?   Attention and Perception: Attention normal.     ?   Mood and Affect: Mood normal.     ?   Speech: Speech normal.     ?   Behavior: Behavior normal. Behavior is cooperative.     ?   Thought Content: Thought content normal.  ? ?Results for orders placed or performed in visit on 06/02/21  ?Microscopic Examination  ?Result Value Ref Range  ? WBC, UA 0-5 0 - 5 /hpf  ? RBC None seen 0 - 2 /hpf  ? Epithelial Cells (non renal) 0-10 0 - 10 /hpf  ? Mucus, UA Present (A) Not Estab.  ? Bacteria, UA None seen None seen/Few  ?WET PREP FOR [place], YEAST, CLUE  ? Urine  ?Result Value Ref Range  ? Trichomonas Exam Negative Negative  ? Yeast Exam Negative Negative  ? Clue Cell Exam Negative Negative  ?Urine Culture  ? Specimen: Urine  ? UR  ?Result Value Ref Range  ? Urine Culture, Routine Final report   ? Organism ID, Bacteria Comment   ?Urinalysis, Routine w reflex microscopic  ?Result Value Ref Range  ? Specific Gravity, UA 1.015 1.005 - 1.030  ? pH, UA 5.5 5.0 - 7.5  ? Color, UA Yellow Yellow  ? Appearance Ur Clear Clear  ? Leukocytes,UA Trace (A) Negative  ? Protein,UA Negative Negative/Trace  ? Glucose, UA Negative Negative  ? Ketones, UA Negative Negative  ? RBC, UA Negative Negative  ? Bilirubin, UA Negative Negative  ? Urobilinogen, Ur 0.2 0.2 - 1.0 mg/dL  ? Nitrite, UA Negative Negative  ? Microscopic Examination See below:   ?CBC with Differential/Platelet  ?Result Value Ref Range  ? WBC 7.1 3.4 - 10.8 x10E3/uL  ? RBC 4.32 3.77 - 5.28 x10E6/uL  ? Hemoglobin 13.2 11.1 - 15.9 g/dL  ? Hematocrit 39.9 34.0 - 46.6 %  ? MCV 92 79 - 97 fL  ? MCH 30.6 26.6 - 33.0 pg  ? MCHC 33.1 31.5 - 35.7 g/dL  ? RDW 12.3 11.7 - 15.4 %  ? Platelets 224 150 - 450 x10E3/uL  ? Neutrophils 67 Not Estab. %  ? Lymphs 25 Not Estab. %  ? Monocytes 6 Not Estab. %  ? Eos 1 Not Estab. %  ? Basos 1 Not Estab. %  ? Neutrophils Absolute 4.8 1.4 - 7.0 x10E3/uL  ? Lymphocytes Absolute 1.8 0.7 - 3.1 x10E3/uL  ? Monocytes Absolute 0.4  0.1 - 0.9 x10E3/uL  ? EOS (ABSOLUTE) 0.1 0.0 - 0.4 x10E3/uL  ? Basophils Absolute 0.0 0.0 - 0.2 x10E3/uL  ?  Immature Granulocytes 0 Not Estab. %  ? Immature Grans (Abs) 0.0 0.0 - 0.1 x10E3/uL  ?Comprehensive metabolic panel  ?Result Value Ref Range  ? Glucose 88 70 - 99 mg/dL  ? BUN 7 6 - 24 mg/dL  ? Creatinine, Ser 0.67 0.57 - 1.00 mg/dL  ? eGFR 101 >59 mL/min/1.73  ? BUN/Creatinine Ratio 10 9 - 23  ? Sodium 141 134 - 144 mmol/L  ? Potassium 4.2 3.5 - 5.2 mmol/L  ? Chloride 106 96 - 106 mmol/L  ? CO2 20 20 - 29 mmol/L  ? Calcium 10.2 8.7 - 10.2 mg/dL  ? Total Protein 7.6 6.0 - 8.5 g/dL  ? Albumin 4.7 3.8 - 4.9 g/dL  ? Globulin, Total 2.9 1.5 - 4.5 g/dL  ? Albumin/Globulin Ratio 1.6 1.2 - 2.2  ? Bilirubin Total 0.8 0.0 - 1.2 mg/dL  ? Alkaline Phosphatase 130 (H) 44 - 121 IU/L  ? AST 42 (H) 0 - 40 IU/L  ? ALT 54 (H) 0 - 32 IU/L  ? ?   ?Assessment & Plan:  ? ?Problem List Items Addressed This Visit   ? ?  ? Cardiovascular and Mediastinum  ? Hypertension - Primary  ?  Chronic, ongoing with BP well below goal on check past two visits, suspect much related to alcohol cessation, recommend she continue this.  Recommend she monitor BP at least a few mornings a week at home and document.  DASH diet at home.  Continue Losartan 50 MG daily for kidney protection and stop Amlodipine at this time, discussed with patient.  Will trial this reduction.  Labs today: CMP.  Return in 6 weeks to see Santiago Glad. ? ? ?  ?  ?  ? Digestive  ? Fatty liver  ?  Ongoing on u/s noted.  Recommend continued cessation of alcohol intake and focus on health diet at home, reducing processed foods.  Recheck labs today. ? ?  ?  ? Relevant Orders  ? Comprehensive metabolic panel  ? TSH  ? VITAMIN D 25 Hydroxy (Vit-D Deficiency, Fractures)  ? Gamma GT  ?  ? Other  ? Alcohol abuse  ?  Reports she has quit at this time, but for past 2 years she does endorse drinking 6-8 beers a night.  Have recommended ongoing cessation of alcohol use.  Labs today. ? ?  ?  ?  Relevant Orders  ? Comprehensive metabolic panel  ? TSH  ? VITAMIN D 25 Hydroxy (Vit-D Deficiency, Fractures)  ? Elevated liver enzymes  ?  Noted on labs May 2021 and April 2022 with calculated R Factor 3.0 (mixed) and fatty

## 2021-06-22 NOTE — Assessment & Plan Note (Signed)
Reports she has quit at this time, but for past 2 years she does endorse drinking 6-8 beers a night.  Have recommended ongoing cessation of alcohol use.  Labs today. ?

## 2021-06-22 NOTE — Assessment & Plan Note (Addendum)
Noted on labs May 2021 and April 2022 with calculated R Factor 3.0 (mixed) and fatty liver noted on  imaging + questionable cirrhosis on recent CT -- recent liver function testing had trended down with cessation of alcohol intake.  Recommend she continue this.  Recheck CMP today + check GGT and TSH.  Recommend avoiding Tylenol use at home. ?

## 2021-06-22 NOTE — Assessment & Plan Note (Signed)
Chronic, ongoing with BP well below goal on check past two visits, suspect much related to alcohol cessation, recommend she continue this.  Recommend she monitor BP at least a few mornings a week at home and document.  DASH diet at home.  Continue Losartan 50 MG daily for kidney protection and stop Amlodipine at this time, discussed with patient.  Will trial this reduction.  Labs today: CMP.  Return in 6 weeks to see Santiago Glad. ? ?

## 2021-06-22 NOTE — Assessment & Plan Note (Signed)
Improved at this time, will recheck labs. ?

## 2021-06-23 LAB — COMPREHENSIVE METABOLIC PANEL
ALT: 31 IU/L (ref 0–32)
AST: 42 IU/L — ABNORMAL HIGH (ref 0–40)
Albumin/Globulin Ratio: 1.5 (ref 1.2–2.2)
Albumin: 4.7 g/dL (ref 3.8–4.9)
Alkaline Phosphatase: 110 IU/L (ref 44–121)
BUN/Creatinine Ratio: 12 (ref 9–23)
BUN: 10 mg/dL (ref 6–24)
Bilirubin Total: 0.6 mg/dL (ref 0.0–1.2)
CO2: 22 mmol/L (ref 20–29)
Calcium: 10.1 mg/dL (ref 8.7–10.2)
Chloride: 103 mmol/L (ref 96–106)
Creatinine, Ser: 0.86 mg/dL (ref 0.57–1.00)
Globulin, Total: 3.2 g/dL (ref 1.5–4.5)
Glucose: 91 mg/dL (ref 70–99)
Potassium: 4 mmol/L (ref 3.5–5.2)
Sodium: 139 mmol/L (ref 134–144)
Total Protein: 7.9 g/dL (ref 6.0–8.5)
eGFR: 78 mL/min/{1.73_m2} (ref >59)

## 2021-06-23 LAB — TSH: TSH: 1.19 u[IU]/mL (ref 0.450–4.500)

## 2021-06-23 LAB — GAMMA GT: GGT: 168 IU/L — ABNORMAL HIGH (ref 0–60)

## 2021-06-23 LAB — VITAMIN D 25 HYDROXY (VIT D DEFICIENCY, FRACTURES): Vit D, 25-Hydroxy: 15.1 ng/mL — ABNORMAL LOW (ref 30.0–100.0)

## 2021-06-24 ENCOUNTER — Encounter: Payer: Self-pay | Admitting: Nurse Practitioner

## 2021-06-24 DIAGNOSIS — E559 Vitamin D deficiency, unspecified: Secondary | ICD-10-CM | POA: Insufficient documentation

## 2021-06-24 NOTE — Progress Notes (Signed)
Contacted via [place] ? ? ?Good afternoon Deanna Velez, your labs have returned: ?- Liver function testing, AST and ALT, continues to trend down.  Great news!! Alkaline phosphatase has trended down too.  GGT, the other lab I talked about to look at gall bladder, is elevated a little.  As I discussed this can go with alkaline phosphatase, at this time we will just monitor as recent imaging showed no stones in gall bladder. ?- Vitamin D level a little low, please start taking Vitamin D3 2000 units daily for bone health with aging. ?- Thyroid level normal.  Any questions? ?Keep being amazing!!  Thank you for allowing me to participate in your care.  I appreciate you. ?Kindest regards, ?Isebella Upshur ?

## 2021-07-07 ENCOUNTER — Other Ambulatory Visit: Payer: Self-pay | Admitting: Nurse Practitioner

## 2021-07-07 NOTE — Telephone Encounter (Signed)
Requested medication (s) are due for refill today: Yes ? ?Requested medication (s) are on the active medication list: Yes ? ?Last refill:  Losartan 05/25/20 ? ?Future visit scheduled: Yes ? ?Notes to clinic:  Amlodipine discontinued 06/22/21. ? ? ? ?Requested Prescriptions  ?Pending Prescriptions Disp Refills  ? amLODipine (NORVASC) 5 MG tablet [Pharmacy Med Name: amLODIPine BESYLATE 5 MG TAB] 90 tablet 4  ?  Sig: TAKE ONE TABLET BY MOUTH DAILY  ?  ? Cardiovascular: Calcium Channel Blockers 2 Passed - 07/07/2021  6:21 AM  ?  ?  Passed - Last BP in normal range  ?  BP Readings from Last 1 Encounters:  ?06/22/21 98/64  ?   ?  ?  Passed - Last Heart Rate in normal range  ?  Pulse Readings from Last 1 Encounters:  ?06/22/21 61  ?   ?  ?  Passed - Valid encounter within last 6 months  ?  Recent Outpatient Visits   ? ?      ? 2 weeks ago Primary hypertension  ? [place], [place] T, NP  ? 1 month ago Generalized abdominal pain  ? [facility] [place], [place] T, NP  ? 11 months ago Acute cystitis with hematuria  ? [place], [place] P, DO  ? 1 year ago Acute non-recurrent frontal sinusitis  ? [place], [place] T, NP  ? 1 year ago Centrilobular emphysema ([place])  ? [facility] [place], Henrine Screws T, NP  ? ?  ?  ?Future Appointments   ? ?        ? In 1 month [place], Barbaraann Faster, NP [employer]  ? ?  ? ? ?  ?  ?  ? losartan (COZAAR) 50 MG tablet [Pharmacy Med Name: LOSARTAN POTASSIUM 50 MG TAB] 90 tablet 4  ?  Sig: TAKE ONE TABLET BY MOUTH DAILY  ?  ? Cardiovascular:  Angiotensin Receptor Blockers Passed - 07/07/2021  6:21 AM  ?  ?  Passed - Cr in normal range and within 180 days  ?  Creatinine, Ser  ?Date Value Ref Range Status  ?06/22/2021 0.86 0.57 - 1.00 mg/dL Final  ?   ?  ?  Passed - K in normal range and within 180 days  ?  Potassium  ?Date Value Ref Range Status  ?06/22/2021 4.0 3.5 - 5.2 mmol/L Final  ?   ?  ?  Passed -  Patient is not pregnant  ?  ?  Passed - Last BP in normal range  ?  BP Readings from Last 1 Encounters:  ?06/22/21 98/64  ?   ?  ?  Passed - Valid encounter within last 6 months  ?  Recent Outpatient Visits   ? ?      ? 2 weeks ago Primary hypertension  ? [place], [place] T, NP  ? 1 month ago Generalized abdominal pain  ? [facility] [employer], [place] T, NP  ? 11 months ago Acute cystitis with hematuria  ? [place], [place] P, DO  ? 1 year ago Acute non-recurrent frontal sinusitis  ? [place], [place] T, NP  ? 1 year ago Centrilobular emphysema ([place])  ? [facility] [place], Henrine Screws T, NP  ? ?  ?  ?Future Appointments   ? ?        ? In 1 month [place], Barbaraann Faster, NP [employer],  [facility]  ? ?  ? ? ?  ?  ?  ? ?

## 2021-08-02 ENCOUNTER — Telehealth: Payer: Self-pay | Admitting: Nurse Practitioner

## 2021-08-02 ENCOUNTER — Ambulatory Visit: Payer: BC Managed Care – PPO | Admitting: Nurse Practitioner

## 2021-08-02 NOTE — Telephone Encounter (Signed)
Spoke with patient regarding insurance questions. Contacted [employer] to obtain explanation regarding copay discrepancy. Reference#[id number]  I

## 2021-08-02 NOTE — Telephone Encounter (Signed)
Copied from [place] [id number]. Topic: General - Inquiry >> Jul 30, 2021  8:45 AM Erskine Squibb wrote: Reason for CRM: Patient called in stating the provider has not been added to the [place] and she is still being charged $45 instead of $30. The patient has already talked to [facility] and they informed her that is why she is being charged that extra amount. Please assist patient further.

## 2021-08-02 NOTE — Telephone Encounter (Signed)
Patient checking status of return call. Informed patient office was closed Friday 6-9  Please follow up w/ patient

## 2021-08-09 ENCOUNTER — Ambulatory Visit: Payer: BC Managed Care – PPO | Admitting: Nurse Practitioner

## 2021-08-17 ENCOUNTER — Encounter: Payer: Self-pay | Admitting: Unknown Physician Specialty

## 2021-08-17 ENCOUNTER — Ambulatory Visit: Payer: BC Managed Care – PPO | Admitting: Unknown Physician Specialty

## 2021-08-17 DIAGNOSIS — I10 Essential (primary) hypertension: Secondary | ICD-10-CM | POA: Diagnosis not present

## 2021-08-17 DIAGNOSIS — R748 Abnormal levels of other serum enzymes: Secondary | ICD-10-CM | POA: Diagnosis not present

## 2021-08-17 DIAGNOSIS — E559 Vitamin D deficiency, unspecified: Secondary | ICD-10-CM | POA: Diagnosis not present

## 2021-08-17 NOTE — Assessment & Plan Note (Signed)
Stable off Amlodipine.  Continue present medications

## 2021-08-18 LAB — COMPREHENSIVE METABOLIC PANEL
ALT: 27 IU/L (ref 0–32)
AST: 17 IU/L (ref 0–40)
Albumin/Globulin Ratio: 1.6 (ref 1.2–2.2)
Albumin: 4.4 g/dL (ref 3.8–4.9)
Alkaline Phosphatase: 113 IU/L (ref 44–121)
BUN/Creatinine Ratio: 9 — ABNORMAL LOW (ref 12–28)
BUN: 7 mg/dL — ABNORMAL LOW (ref 8–27)
Bilirubin Total: 0.5 mg/dL (ref 0.0–1.2)
CO2: 22 mmol/L (ref 20–29)
Calcium: 9.8 mg/dL (ref 8.7–10.3)
Chloride: 102 mmol/L (ref 96–106)
Creatinine, Ser: 0.74 mg/dL (ref 0.57–1.00)
Globulin, Total: 2.8 g/dL (ref 1.5–4.5)
Glucose: 99 mg/dL (ref 70–99)
Potassium: 4.5 mmol/L (ref 3.5–5.2)
Sodium: 138 mmol/L (ref 134–144)
Total Protein: 7.2 g/dL (ref 6.0–8.5)
eGFR: 93 mL/min/{1.73_m2} (ref >59)

## 2021-08-18 LAB — LIPID PANEL W/O CHOL/HDL RATIO
Cholesterol, Total: 180 mg/dL (ref 100–199)
HDL: 44 mg/dL (ref >39)
LDL Chol Calc (NIH): 117 mg/dL — ABNORMAL HIGH (ref 0–99)
Triglycerides: 106 mg/dL (ref 0–149)
VLDL Cholesterol Cal: 19 mg/dL (ref 5–40)

## 2021-08-18 LAB — GAMMA GT: GGT: 134 IU/L — ABNORMAL HIGH (ref 0–60)

## 2021-08-18 LAB — VITAMIN D 25 HYDROXY (VIT D DEFICIENCY, FRACTURES): Vit D, 25-Hydroxy: 39.2 ng/mL (ref 30.0–100.0)

## 2021-10-18 ENCOUNTER — Other Ambulatory Visit: Payer: Self-pay | Admitting: Nurse Practitioner

## 2021-10-19 NOTE — Telephone Encounter (Signed)
Requested Prescriptions  Pending Prescriptions Disp Refills  . ibuprofen (ADVIL) 800 MG tablet [Pharmacy Med Name: IBUPROFEN 800 MG TABLET] 30 tablet 0    Sig: TAKE ONE TABLET BY MOUTH EVERY 8 HOURS AS NEEDED     Analgesics:  NSAIDS Failed - 10/18/2021  8:09 AM      Failed - Manual Review: Labs are only required if the patient has taken medication for more than 8 weeks.      Passed - Cr in normal range and within 360 days    Creatinine, Ser  Date Value Ref Range Status  08/17/2021 0.74 0.57 - 1.00 mg/dL Final         Passed - HGB in normal range and within 360 days    Hemoglobin  Date Value Ref Range Status  06/02/2021 13.2 11.1 - 15.9 g/dL Final         Passed - PLT in normal range and within 360 days    Platelets  Date Value Ref Range Status  06/02/2021 224 150 - 450 x10E3/uL Final         Passed - HCT in normal range and within 360 days    Hematocrit  Date Value Ref Range Status  06/02/2021 39.9 34.0 - 46.6 % Final         Passed - eGFR is 30 or above and within 360 days    GFR calc Af Amer  Date Value Ref Range Status  07/05/2019 101 >59 mL/min/1.73 Final    Comment:    **[facility] currently reports eGFR in compliance with the current**   recommendations of the [employer]. [facility] will   update reporting as new guidelines are published from the NKF-ASN   Task force.    GFR calc non Af Amer  Date Value Ref Range Status  07/05/2019 87 >59 mL/min/1.73 Final   eGFR  Date Value Ref Range Status  08/17/2021 93 >59 mL/min/1.73 Final         Passed - Patient is not pregnant      Passed - Valid encounter within last 12 months    Recent Outpatient Visits          2 months ago Primary hypertension   [place] Kathrine Haddock, NP   3 months ago Primary hypertension   [place], [place] T, NP   4 months ago Generalized abdominal pain   [place], [place] T, NP   1 year ago Acute cystitis with  hematuria   [place], Megan P, DO   1 year ago Acute non-recurrent frontal sinusitis   [facility] [place], Barbaraann Faster, NP

## 2021-11-22 ENCOUNTER — Telehealth: Payer: Self-pay

## 2021-11-25 NOTE — Telephone Encounter (Signed)
error 

## 2021-11-29 ENCOUNTER — Other Ambulatory Visit: Payer: Self-pay | Admitting: Nurse Practitioner

## 2021-11-29 DIAGNOSIS — Z1231 Encounter for screening mammogram for malignant neoplasm of breast: Secondary | ICD-10-CM

## 2021-12-24 ENCOUNTER — Ambulatory Visit
Admission: RE | Admit: 2021-12-24 | Discharge: 2021-12-24 | Disposition: A | Discharge status: Home / Self Care | Payer: BC Managed Care – PPO | Source: Ambulatory Visit | Attending: Nurse Practitioner | Admitting: Nurse Practitioner

## 2021-12-24 DIAGNOSIS — Z1231 Encounter for screening mammogram for malignant neoplasm of breast: Secondary | ICD-10-CM | POA: Diagnosis present

## 2021-12-28 NOTE — Progress Notes (Signed)
Contacted via MyChart   Normal mammogram, may repeat in one year:)

## 2022-01-01 DIAGNOSIS — E782 Mixed hyperlipidemia: Secondary | ICD-10-CM | POA: Insufficient documentation

## 2022-01-01 DIAGNOSIS — E7800 Pure hypercholesterolemia, unspecified: Secondary | ICD-10-CM | POA: Insufficient documentation

## 2022-01-01 NOTE — Patient Instructions (Signed)

## 2022-01-03 ENCOUNTER — Ambulatory Visit (INDEPENDENT_AMBULATORY_CARE_PROVIDER_SITE_OTHER): Payer: BC Managed Care – PPO | Admitting: Nurse Practitioner

## 2022-01-03 ENCOUNTER — Encounter: Payer: Self-pay | Admitting: Nurse Practitioner

## 2022-01-03 VITALS — BP 113/75 | HR 73 | Temp 98.3°F | Wt 148.3 lb

## 2022-01-03 DIAGNOSIS — Z6831 Body mass index (BMI) 31.0-31.9, adult: Secondary | ICD-10-CM

## 2022-01-03 DIAGNOSIS — Z124 Encounter for screening for malignant neoplasm of cervix: Secondary | ICD-10-CM

## 2022-01-03 DIAGNOSIS — I10 Essential (primary) hypertension: Secondary | ICD-10-CM

## 2022-01-03 DIAGNOSIS — M79672 Pain in left foot: Secondary | ICD-10-CM

## 2022-01-03 DIAGNOSIS — E7800 Pure hypercholesterolemia, unspecified: Secondary | ICD-10-CM

## 2022-01-03 DIAGNOSIS — J432 Centrilobular emphysema: Secondary | ICD-10-CM

## 2022-01-03 DIAGNOSIS — Z23 Encounter for immunization: Secondary | ICD-10-CM | POA: Diagnosis not present

## 2022-01-03 DIAGNOSIS — Z0000 Encounter for general adult medical examination without abnormal findings: Secondary | ICD-10-CM | POA: Diagnosis not present

## 2022-01-03 DIAGNOSIS — K760 Fatty (change of) liver, not elsewhere classified: Secondary | ICD-10-CM

## 2022-01-03 DIAGNOSIS — E559 Vitamin D deficiency, unspecified: Secondary | ICD-10-CM

## 2022-01-03 DIAGNOSIS — Z87891 Personal history of nicotine dependence: Secondary | ICD-10-CM

## 2022-01-03 DIAGNOSIS — R748 Abnormal levels of other serum enzymes: Secondary | ICD-10-CM

## 2022-01-03 DIAGNOSIS — N644 Mastodynia: Secondary | ICD-10-CM | POA: Insufficient documentation

## 2022-01-03 DIAGNOSIS — E6609 Other obesity due to excess calories: Secondary | ICD-10-CM

## 2022-01-03 DIAGNOSIS — R7301 Impaired fasting glucose: Secondary | ICD-10-CM

## 2022-01-03 NOTE — Assessment & Plan Note (Signed)
Chronic, stable.  No longer taking medications and no alcohol use in over one year.  Praised for this.  Continue diet focus at home.  Recommend she monitor BP at least a few mornings a week at home and document.  DASH diet at home.  Labs today: CBC, CMP, TSH.  Return in 6 months.

## 2022-01-03 NOTE — Assessment & Plan Note (Signed)
Noted on labs May 2021 and April 2022 with calculated R Factor 3.0 (mixed) and fatty liver noted on  imaging + questionable cirrhosis on past CT, recent was stable.  Recommend continued cessation of alcohol use.  Recheck CMP and TSH.  Recommend avoiding Tylenol use at home.

## 2022-01-03 NOTE — Assessment & Plan Note (Signed)
BMI 28.96, loss present with alcohol reduction  Recommended eating smaller high protein, low fat meals more frequently and exercising 30 mins a day 5 times a week with a goal of 10-15lb weight loss in the next 3 months. Patient voiced their understanding and motivation to adhere to these recommendations.

## 2022-01-03 NOTE — Assessment & Plan Note (Signed)
Cyst-like area to bottom of foot, referral to podiatry placed.

## 2022-01-03 NOTE — Progress Notes (Signed)
BP 113/75   Pulse 73   Temp 98.3 F (36.8 C) (Oral)   Wt 148 lb 4.8 oz (67.3 kg)   LMP  (LMP Unknown)   SpO2 91%   BMI 28.96 kg/m    Subjective:    Patient ID: Deanna Velez, female    DOB: Sep 26, 1960, 61 y.o.   MRN: [id number]  HPI: Deanna Velez is a 61 y.o. female presenting on 01/03/2022 for comprehensive medical examination. Current medical complaints include: breast issues  She currently lives with: significant other Menopausal Symptoms: no  Has left foot issues, has been present for years.  Over 3 years.  Midline bottom of foot -- at night time feels it often when walks.  Notices it every night.  Has not used anything at home to treat.  HYPERTENSION / HYPERLIPIDEMIA Currently not taking any medications.  Quit drinking alcohol over a year ago.  BP has been better since this.    Has had ongoing sternal discomfort for years to sternum area and around to back.  Left breast she reports is bigger with changes and it itches a lot under neath.  Breast hurts at night deep into breast.  Has been present issue for over 3 years.  Recent mammogram was normal. Satisfied with current treatment? yes Duration of hyperlipidemia: chronic Cholesterol medication side effects: no Cholesterol supplements: none Medication compliance: good compliance Aspirin: no Recent stressors: no Recurrent headaches: no Visual changes: no Palpitations: no Dyspnea: rarely Chest pain: no Lower extremity edema: no Dizzy/lightheaded: no   COPD Last scan on 05/03/21 = centrilobular and paraseptal emphysema. COPD status: stable Satisfied with current treatment?: yes Oxygen use: no Dyspnea frequency: occasional Cough frequency: none Rescue inhaler frequency:  none Limitation of activity: no Productive cough: none Last Spirometry: none Pneumovax: Up to Date Influenza: Up to Date   Depression Screen done today and results listed below:     01/03/2022    1:57 PM 06/02/2021   11:36 AM 05/25/2020    1:15 PM  10/04/2018    1:54 PM 01/16/2018   10:26 AM  Depression screen PHQ 2/9  Decreased Interest 0 0 0 0 0  Down, Depressed, Hopeless 0 0 0 0 0  PHQ - 2 Score 0 0 0 0 0  Altered sleeping 0 0 0 0   Tired, decreased energy 0 0 0 0   Change in appetite 0 0 0 0   Feeling bad or failure about yourself  0 0 0 0 1  Trouble concentrating 0 0 0 0   Moving slowly or fidgety/restless 0 0 0 0   Suicidal thoughts 0 0 0 0   PHQ-9 Score 0 0 0 0   Difficult doing work/chores Not difficult at all        The patient does not have a history of falls. I did not complete a risk assessment for falls. A plan of care for falls was not documented.  Functional Status Survey: Is the patient deaf or have difficulty hearing?: No Does the patient have difficulty seeing, even when wearing glasses/contacts?: No Does the patient have difficulty concentrating, remembering, or making decisions?: No Does the patient have difficulty walking or climbing stairs?: No Does the patient have difficulty dressing or bathing?: No Does the patient have difficulty doing errands alone such as visiting a doctor's office or shopping?: No    Past Medical History:  Past Medical History:  Diagnosis Date   Gallbladder attack    Hypertension    Lack of libido  PMB (postmenopausal bleeding)    Ringworm    Vitamin D deficiency disease     Surgical History:  Past Surgical History:  Procedure Laterality Date   TOE SURGERY Right 05/2014   TONSILLECTOMY     TUBAL LIGATION      Medications:  Current Outpatient Medications on File Prior to Visit  Medication Sig   EPINEPHrine (EPIPEN 2-PAK) 0.3 mg/0.3 mL IJ SOAJ injection Inject 0.3 mLs (0.3 mg total) into the muscle as needed for anaphylaxis.   ibuprofen (ADVIL) 800 MG tablet TAKE ONE TABLET BY MOUTH EVERY 8 HOURS AS NEEDED   No current facility-administered medications on file prior to visit.    Allergies:  Allergies  Allergen Reactions   Other Anaphylaxis    Animal  meat "alpha-gal" (the sugar in red meat)   Beef-Derived Products    Pork-Derived Products     Social History:  Social History   Socioeconomic History   Marital status: Married    Spouse name: Not on file   Number of children: Not on file   Years of education: Not on file   Highest education level: Not on file  Occupational History   Not on file  Tobacco Use   Smoking status: Former    Packs/day: 0.75    Years: 36.00    Total pack years: 27.00    Types: Cigarettes   Smokeless tobacco: Never   Tobacco comments:    patient stated she stopped smoking 2 months ago  Vaping Use   Vaping Use: Never used  Substance and Sexual Activity   Alcohol use: Yes    Alcohol/week: 0.0 standard drinks of alcohol    Comment: beer on the weekends   Drug use: No   Sexual activity: Yes    Birth control/protection: Surgical    Comment: tubal   Other Topics Concern   Not on file  Social History Narrative   Not on file   Social Determinants of Health   Financial Resource Strain: Not on file  Food Insecurity: Not on file  Transportation Needs: Not on file  Physical Activity: Not on file  Stress: Not on file  Social Connections: Not on file  Intimate Partner Violence: Not on file   Social History   Tobacco Use  Smoking Status Former   Packs/day: 0.75   Years: 36.00   Total pack years: 27.00   Types: Cigarettes  Smokeless Tobacco Never  Tobacco Comments   patient stated she stopped smoking 2 months ago   Social History   Substance and Sexual Activity  Alcohol Use Yes   Alcohol/week: 0.0 standard drinks of alcohol   Comment: beer on the weekends    Family History:  Family History  Problem Relation Age of Onset   Hyperlipidemia Mother    Diabetes Mother    Heart disease Mother    Cancer Sister        leukemia   Diabetes Sister    Diabetes Maternal Grandmother    Hyperlipidemia Sister    Diabetes Sister    Stroke Sister    Breast cancer Neg Hx    Past medical  history, surgical history, medications, allergies, family history and social history reviewed with patient today and changes made to appropriate areas of the chart.   ROS All other ROS negative except what is listed above and in the HPI.      Objective:    BP 113/75   Pulse 73   Temp 98.3 F (36.8 C) (Oral)  Wt 148 lb 4.8 oz (67.3 kg)   LMP  (LMP Unknown)   SpO2 91%   BMI 28.96 kg/m   Wt Readings from Last 3 Encounters:  01/03/22 148 lb 4.8 oz (67.3 kg)  08/17/21 162 lb 6.4 oz (73.7 kg)  06/22/21 159 lb 3.2 oz (72.2 kg)    Physical Exam Vitals and nursing note reviewed. Exam conducted with a chaperone present.  Constitutional:      General: She is awake. She is not in acute distress.    Appearance: She is well-developed and well-groomed. She is obese. She is not ill-appearing or toxic-appearing.  HENT:     Head: Normocephalic and atraumatic.     Right Ear: Hearing, tympanic membrane, ear canal and external ear normal. No drainage.     Left Ear: Hearing, tympanic membrane, ear canal and external ear normal. No drainage.     Nose: Nose normal.     Right Sinus: No maxillary sinus tenderness or frontal sinus tenderness.     Left Sinus: No maxillary sinus tenderness or frontal sinus tenderness.     Mouth/Throat:     Mouth: Mucous membranes are moist.     Pharynx: Oropharynx is clear. Uvula midline. No pharyngeal swelling, oropharyngeal exudate or posterior oropharyngeal erythema.  Eyes:     General: Lids are normal.        Right eye: No discharge.        Left eye: No discharge.     Extraocular Movements: Extraocular movements intact.     Conjunctiva/sclera: Conjunctivae normal.     Pupils: Pupils are equal, round, and reactive to light.     Visual Fields: Right eye visual fields normal and left eye visual fields normal.  Neck:     Thyroid: No thyromegaly.     Vascular: No carotid bruit.     Trachea: Trachea normal.  Cardiovascular:     Rate and Rhythm: Normal rate and  regular rhythm.     Pulses:          Dorsalis pedis pulses are 2+ on the right side and 2+ on the left side.       Posterior tibial pulses are 2+ on the right side and 2+ on the left side.     Heart sounds: Normal heart sounds. No murmur heard.    No gallop.  Pulmonary:     Effort: Pulmonary effort is normal. No accessory muscle usage or respiratory distress.     Breath sounds: Normal breath sounds.  Chest:     Comments: Deferred per request Abdominal:     General: Bowel sounds are normal.     Palpations: Abdomen is soft. There is no hepatomegaly or splenomegaly.     Tenderness: There is no abdominal tenderness.  Musculoskeletal:        General: Normal range of motion.     Cervical back: Normal range of motion and neck supple.     Right lower leg: No edema.     Left lower leg: No edema.     Right foot: Normal range of motion.     Left foot: Normal range of motion.       Feet:  Feet:     Right foot:     Protective Sensation: 10 sites tested.  10 sites sensed.     Toenail Condition: Right toenails are normal.     Left foot:     Protective Sensation: 10 sites tested.  10 sites sensed.     Toenail Condition:  Left toenails are normal.  Lymphadenopathy:     Head:     Right side of head: No submental, submandibular, tonsillar, preauricular or posterior auricular adenopathy.     Left side of head: No submental, submandibular, tonsillar, preauricular or posterior auricular adenopathy.     Cervical: No cervical adenopathy.  Skin:    General: Skin is warm and dry.     Capillary Refill: Capillary refill takes less than 2 seconds.     Findings: No rash.  Neurological:     Mental Status: She is alert and oriented to person, place, and time.     Gait: Gait is intact.     Deep Tendon Reflexes: Reflexes are normal and symmetric.     Reflex Scores:      Brachioradialis reflexes are 2+ on the right side and 2+ on the left side.      Patellar reflexes are 2+ on the right side and 2+ on the  left side. Psychiatric:        Attention and Perception: Attention normal.        Mood and Affect: Mood normal.        Speech: Speech normal.        Behavior: Behavior normal. Behavior is cooperative.        Thought Content: Thought content normal.        Judgment: Judgment normal.    Results for orders placed or performed in visit on 08/17/21  Comprehensive metabolic panel  Result Value Ref Range   Glucose 99 70 - 99 mg/dL   BUN 7 (L) 8 - 27 mg/dL   Creatinine, Ser 0.74 0.57 - 1.00 mg/dL   eGFR 93 >59 mL/min/1.73   BUN/Creatinine Ratio 9 (L) 12 - 28   Sodium 138 134 - 144 mmol/L   Potassium 4.5 3.5 - 5.2 mmol/L   Chloride 102 96 - 106 mmol/L   CO2 22 20 - 29 mmol/L   Calcium 9.8 8.7 - 10.3 mg/dL   Total Protein 7.2 6.0 - 8.5 g/dL   Albumin 4.4 3.8 - 4.9 g/dL   Globulin, Total 2.8 1.5 - 4.5 g/dL   Albumin/Globulin Ratio 1.6 1.2 - 2.2   Bilirubin Total 0.5 0.0 - 1.2 mg/dL   Alkaline Phosphatase 113 44 - 121 IU/L   AST 17 0 - 40 IU/L   ALT 27 0 - 32 IU/L  Lipid Panel w/o Chol/HDL Ratio  Result Value Ref Range   Cholesterol, Total 180 100 - 199 mg/dL   Triglycerides 106 0 - 149 mg/dL   HDL 44 >39 mg/dL   VLDL Cholesterol Cal 19 5 - 40 mg/dL   LDL Chol Calc (NIH) 117 (H) 0 - 99 mg/dL  Gamma GT  Result Value Ref Range   GGT 134 (H) 0 - 60 IU/L  VITAMIN D 25 Hydroxy (Vit-D Deficiency, Fractures)  Result Value Ref Range   Vit D, 25-Hydroxy 39.2 30.0 - 100.0 ng/mL      Assessment & Plan:   Problem List Items Addressed This Visit       Cardiovascular and Mediastinum   Hypertension    Chronic, stable.  No longer taking medications and no alcohol use in over one year.  Praised for this.  Continue diet focus at home.  Recommend she monitor BP at least a few mornings a week at home and document.  DASH diet at home.  Labs today: CBC, CMP, TSH.  Return in 6 months.       Relevant Orders  Comprehensive metabolic panel   TSH     Respiratory   Centrilobular emphysema  (HCC) - Primary    Chronic, ongoing.  Quit smoking > 1 1/2 years ago, praised for continued cessation.  At this time no inhaler regimen and overall no symptoms.  Will plan on spirometry next visit and recommend to continue yearly lung screening until age 89.  Return in 6 months for chronic disease visit.      Relevant Orders   CBC with Differential/Platelet     Digestive   Fatty liver    Ongoing on u/s noted.  Recommend continued cessation of alcohol intake and focus on health diet at home, reducing processed foods.  Recheck labs today.      Relevant Orders   Comprehensive metabolic panel     Other   Breast pain, left    Recent mammogram normal, but she reports noticing skin changes and pain to left breast -- will order diagnostic and ultrasound for further reassurance.      Relevant Orders   MM DIAG BREAST TOMO BILATERAL   US BREAST LTD UNI LEFT INC AXILLA   Elevated liver enzymes    Noted on labs May 2021 and April 2022 with calculated R Factor 3.0 (mixed) and fatty liver noted on  imaging + questionable cirrhosis on past CT, recent was stable.  Recommend continued cessation of alcohol use.  Recheck CMP and TSH.  Recommend avoiding Tylenol use at home.      Relevant Orders   Comprehensive metabolic panel   Elevated low density lipoprotein (LDL) cholesterol level    Noted on past labs, recheck today and initiate medication as needed. The 10-year ASCVD risk score (Arnett DK, et al., 2019) is: 5.8%   Values used to calculate the score:     Age: 27 years     Sex: Female     Is Non-Hispanic African American: No     Diabetic: No     Tobacco smoker: Yes     Systolic Blood Pressure: 017 mmHg     Is BP treated: No     HDL Cholesterol: 44 mg/dL     Total Cholesterol: 180 mg/dL       Relevant Orders   Comprehensive metabolic panel   Lipid Panel w/o Chol/HDL Ratio   Foot pain, left    Cyst-like area to bottom of foot, referral to podiatry placed.      Relevant Orders    Ambulatory referral to Podiatry   Obesity    BMI 28.96, loss present with alcohol reduction  Recommended eating smaller high protein, low fat meals more frequently and exercising 30 mins a day 5 times a week with a goal of 10-15lb weight loss in the next 3 months. Patient voiced their understanding and motivation to adhere to these recommendations.       Vitamin D deficiency    Chronic, ongoing.  Continue supplement at home and recheck level today.      Relevant Orders   VITAMIN D 25 Hydroxy (Vit-D Deficiency, Fractures)   Other Visit Diagnoses     IFG (impaired fasting glucose)       Check A1c today, noted past labs.   Relevant Orders   HgB A1c   Need for influenza vaccination       Flu vaccine today   Relevant Orders   Flu Vaccine QUAD 85moIM (Fluarix, Fluzone & Alfiuria Quad PF) (Completed)   Need for tetanus booster       Td vaccine  today   Relevant Orders   Td : Tetanus/diphtheria >7yo Preservative  free (Completed)   Encounter for annual physical exam       Annual physical today with labs and health maintenance reviewed, discussed with patient.        Follow up plan: Return in about 6 months (around 07/04/2022) for HTN/HLD, COPD.   LABORATORY TESTING:  - Pap smear:  refuses  IMMUNIZATIONS:   - Tdap: Tetanus vaccination status reviewed: Td vaccination indicated and given today. - Influenza: Administered today - Pneumovax: Up to date - Prevnar: Not applicable - COVID: Up to date - HPV: Not applicable - Shingrix vaccine: Refused  SCREENING: -Mammogram: Up to date  - Colonoscopy: Up to date -- Cologuard next due 06/21/23 - Bone Density: Not applicable  -Hearing Test: Not applicable  -Spirometry: Not applicable   PATIENT COUNSELING:   Advised to take 1 mg of folate supplement per day if capable of pregnancy.   Sexuality: Discussed sexually transmitted diseases, partner selection, use of condoms, avoidance of unintended pregnancy  and contraceptive  alternatives.   Advised to avoid cigarette smoking.  I discussed with the patient that most people either abstain from alcohol or drink within safe limits (<=14/week and <=4 drinks/occasion for males, <=7/weeks and <= 3 drinks/occasion for females) and that the risk for alcohol disorders and other health effects rises proportionally with the number of drinks per week and how often a drinker exceeds daily limits.  Discussed cessation/primary prevention of drug use and availability of treatment for abuse.   Diet: Encouraged to adjust caloric intake to maintain  or achieve ideal body weight, to reduce intake of dietary saturated fat and total fat, to limit sodium intake by avoiding high sodium foods and not adding table salt, and to maintain adequate dietary potassium and calcium preferably from fresh fruits, vegetables, and low-fat dairy products.    Stressed the importance of regular exercise  Injury prevention: Discussed safety belts, safety helmets, smoke detector, smoking near bedding or upholstery.   Dental health: Discussed importance of regular tooth brushing, flossing, and dental visits.    NEXT PREVENTATIVE PHYSICAL DUE IN 1 YEAR. Return in about 6 months (around 07/04/2022) for HTN/HLD, COPD.

## 2022-01-03 NOTE — Assessment & Plan Note (Signed)
Ongoing on u/s noted.  Recommend continued cessation of alcohol intake and focus on health diet at home, reducing processed foods.  Recheck labs today.

## 2022-01-03 NOTE — Assessment & Plan Note (Signed)
Chronic, ongoing.  Continue supplement at home and recheck level today.

## 2022-01-03 NOTE — Assessment & Plan Note (Signed)
Chronic, ongoing.  Quit smoking > 1 1/2 years ago, praised for continued cessation.  At this time no inhaler regimen and overall no symptoms.  Will plan on spirometry next visit and recommend to continue yearly lung screening until age 61.  Return in 6 months for chronic disease visit.

## 2022-01-03 NOTE — Assessment & Plan Note (Signed)
Noted on past labs, recheck today and initiate medication as needed. The 10-year ASCVD risk score (Arnett DK, et al., 2019) is: 5.8%   Values used to calculate the score:     Age: 61 years     Sex: Female     Is Non-Hispanic African American: No     Diabetic: No     Tobacco smoker: Yes     Systolic Blood Pressure: 967 mmHg     Is BP treated: No     HDL Cholesterol: 44 mg/dL     Total Cholesterol: 180 mg/dL

## 2022-01-03 NOTE — Assessment & Plan Note (Signed)
Recent mammogram normal, but she reports noticing skin changes and pain to left breast -- will order diagnostic and ultrasound for further reassurance.

## 2022-01-04 LAB — COMPREHENSIVE METABOLIC PANEL
ALT: 29 IU/L (ref 0–32)
AST: 23 IU/L (ref 0–40)
Albumin/Globulin Ratio: 1.9 (ref 1.2–2.2)
Albumin: 5 g/dL — ABNORMAL HIGH (ref 3.8–4.9)
Alkaline Phosphatase: 110 IU/L (ref 44–121)
BUN/Creatinine Ratio: 11 — ABNORMAL LOW (ref 12–28)
BUN: 10 mg/dL (ref 8–27)
Bilirubin Total: 0.4 mg/dL (ref 0.0–1.2)
CO2: 22 mmol/L (ref 20–29)
Calcium: 10.1 mg/dL (ref 8.7–10.3)
Chloride: 102 mmol/L (ref 96–106)
Creatinine, Ser: 0.88 mg/dL (ref 0.57–1.00)
Globulin, Total: 2.7 g/dL (ref 1.5–4.5)
Glucose: 83 mg/dL (ref 70–99)
Potassium: 4 mmol/L (ref 3.5–5.2)
Sodium: 136 mmol/L (ref 134–144)
Total Protein: 7.7 g/dL (ref 6.0–8.5)
eGFR: 75 mL/min/{1.73_m2} (ref >59)

## 2022-01-04 LAB — CBC WITH DIFFERENTIAL/PLATELET
Basophils Absolute: 0.1 10*3/uL (ref 0.0–0.2)
Basos: 1 %
EOS (ABSOLUTE): 0.2 10*3/uL (ref 0.0–0.4)
Eos: 2 %
Hematocrit: 37.9 % (ref 34.0–46.6)
Hemoglobin: 12.5 g/dL (ref 11.1–15.9)
Immature Grans (Abs): 0 10*3/uL (ref 0.0–0.1)
Immature Granulocytes: 0 %
Lymphocytes Absolute: 2.8 10*3/uL (ref 0.7–3.1)
Lymphs: 32 %
MCH: 27.8 pg (ref 26.6–33.0)
MCHC: 33 g/dL (ref 31.5–35.7)
MCV: 84 fL (ref 79–97)
Monocytes Absolute: 0.4 10*3/uL (ref 0.1–0.9)
Monocytes: 4 %
Neutrophils Absolute: 5.2 10*3/uL (ref 1.4–7.0)
Neutrophils: 61 %
Platelets: 283 10*3/uL (ref 150–450)
RBC: 4.49 x10E6/uL (ref 3.77–5.28)
RDW: 13.1 % (ref 11.7–15.4)
WBC: 8.6 10*3/uL (ref 3.4–10.8)

## 2022-01-04 LAB — HEMOGLOBIN A1C
Est. average glucose Bld gHb Est-mCnc: 120 mg/dL
Hgb A1c MFr Bld: 5.8 % — ABNORMAL HIGH (ref 4.8–5.6)

## 2022-01-04 LAB — VITAMIN D 25 HYDROXY (VIT D DEFICIENCY, FRACTURES): Vit D, 25-Hydroxy: 20.8 ng/mL — ABNORMAL LOW (ref 30.0–100.0)

## 2022-01-04 LAB — LIPID PANEL W/O CHOL/HDL RATIO
Cholesterol, Total: 208 mg/dL — ABNORMAL HIGH (ref 100–199)
HDL: 53 mg/dL (ref >39)
LDL Chol Calc (NIH): 136 mg/dL — ABNORMAL HIGH (ref 0–99)
Triglycerides: 107 mg/dL (ref 0–149)
VLDL Cholesterol Cal: 19 mg/dL (ref 5–40)

## 2022-01-04 LAB — TSH: TSH: 0.582 u[IU]/mL (ref 0.450–4.500)

## 2022-01-04 NOTE — Progress Notes (Signed)
Contacted via MyChart The 10-year ASCVD risk score (Arnett DK, et al., 2019) is: 5.8%   Values used to calculate the score:     Age: 61 years     Sex: Female     Is Non-Hispanic African American: No     Diabetic: No     Tobacco smoker: Yes     Systolic Blood Pressure: 914 mmHg     Is BP treated: No     HDL Cholesterol: 53 mg/dL     Total Cholesterol: 208 mg/dL   Good morning Deanna Velez, your labs have returned: - Kidney function, creatinine and eGFR, remains normal, as is liver function, AST and ALT.  - Vitamin D level is a little low, please ensure you are taking Vitamin D3 2000 units daily for overall bone health - CBC shows no infection or anemia - The A1C is the diabetes testing we talked about, this looks at your blood sugars over the past 3 months and turns the average into a number.  Your number is 5.8%, meaning you are prediabetic.  Any number 5.7 to 6.4 is considered prediabetes and any number 6.5 or greater is considered diabetes.   I would recommend heavy focus on decreasing foods high in sugar and your intake of things like bread products, pasta, and rice.  The American Diabetes Association online has a large amount of information on diet changes to make.  We will recheck this number in 6 months to ensure you are not continuing to trend upwards and move into diabetes.   - Your cholesterol is still high, but continued recommendations to make lifestyle changes. Your LDL is above normal. The LDL is the bad cholesterol. Over time and in combination with inflammation and other factors, this contributes to plaque which in turn may lead to stroke and/or heart attack down the road. Sometimes high LDL is primarily genetic, and people might be eating all the right foods but still have high numbers. Other times, there is room for improvement in one's diet and eating healthier can bring this number down and potentially reduce one's risk of heart attack and/or stroke.   To reduce your LDL,  Remember - more fruits and vegetables, more fish, and limit red meat and dairy products. More soy, nuts, beans, barley, lentils, oats and plant sterol ester enriched margarine instead of butter. I also encourage eliminating sugar and processed food. Remember, shop on the outside of the grocery store and visit your [employer]. If you would like to talk with me about dietary changes for your cholesterol, please let me know. We should recheck your cholesterol in 6 months.  Any questions? Keep being amazing!!  Thank you for allowing me to participate in your care.  I appreciate you. Kindest regards, Shea Kapur

## 2022-01-21 ENCOUNTER — Ambulatory Visit: Payer: Self-pay | Admitting: Podiatry

## 2022-03-01 ENCOUNTER — Telehealth: Payer: Self-pay | Admitting: Nurse Practitioner

## 2022-03-01 NOTE — Telephone Encounter (Signed)
Copied from [place] [id number]. Topic: General - Inquiry >> Feb 28, 2022  5:00 PM Teressa P wrote: Reason for CRM: pt called wanted to know if the Voltarin cream is okay for her to use for hip pain.  CB@  [phone]

## 2022-03-01 NOTE — Telephone Encounter (Signed)
Returned patients call and informed her that she can use Voltaren Gel  on her hip pain

## 2022-05-02 ENCOUNTER — Other Ambulatory Visit: Payer: Self-pay

## 2022-05-02 DIAGNOSIS — Z122 Encounter for screening for malignant neoplasm of respiratory organs: Secondary | ICD-10-CM

## 2022-05-02 DIAGNOSIS — Z87891 Personal history of nicotine dependence: Secondary | ICD-10-CM

## 2022-05-04 ENCOUNTER — Ambulatory Visit: Payer: BC Managed Care – PPO

## 2022-06-06 ENCOUNTER — Ambulatory Visit
Admission: RE | Admit: 2022-06-06 | Discharge: 2022-06-06 | Disposition: A | Discharge status: Home / Self Care | Payer: BC Managed Care – PPO | Source: Ambulatory Visit | Attending: Acute Care | Admitting: Acute Care

## 2022-06-06 DIAGNOSIS — Z122 Encounter for screening for malignant neoplasm of respiratory organs: Secondary | ICD-10-CM | POA: Diagnosis present

## 2022-06-06 DIAGNOSIS — Z87891 Personal history of nicotine dependence: Secondary | ICD-10-CM | POA: Insufficient documentation

## 2022-06-08 ENCOUNTER — Other Ambulatory Visit: Payer: Self-pay

## 2022-06-08 DIAGNOSIS — Z87891 Personal history of nicotine dependence: Secondary | ICD-10-CM

## 2022-06-08 DIAGNOSIS — Z122 Encounter for screening for malignant neoplasm of respiratory organs: Secondary | ICD-10-CM

## 2022-07-02 DIAGNOSIS — R7309 Other abnormal glucose: Secondary | ICD-10-CM | POA: Insufficient documentation

## 2022-07-02 NOTE — Patient Instructions (Incomplete)

## 2022-07-04 ENCOUNTER — Encounter: Payer: Self-pay | Admitting: Nurse Practitioner

## 2022-07-04 ENCOUNTER — Ambulatory Visit: Payer: BC Managed Care – PPO | Admitting: Nurse Practitioner

## 2022-07-04 VITALS — BP 135/85 | HR 99 | Temp 98.3°F | Ht 60.0 in | Wt 147.0 lb

## 2022-07-04 DIAGNOSIS — I10 Essential (primary) hypertension: Secondary | ICD-10-CM | POA: Diagnosis not present

## 2022-07-04 DIAGNOSIS — R7309 Other abnormal glucose: Secondary | ICD-10-CM

## 2022-07-04 DIAGNOSIS — E559 Vitamin D deficiency, unspecified: Secondary | ICD-10-CM

## 2022-07-04 DIAGNOSIS — Z6831 Body mass index (BMI) 31.0-31.9, adult: Secondary | ICD-10-CM

## 2022-07-04 DIAGNOSIS — E7800 Pure hypercholesterolemia, unspecified: Secondary | ICD-10-CM

## 2022-07-04 DIAGNOSIS — R748 Abnormal levels of other serum enzymes: Secondary | ICD-10-CM

## 2022-07-04 DIAGNOSIS — Z87891 Personal history of nicotine dependence: Secondary | ICD-10-CM

## 2022-07-04 DIAGNOSIS — J432 Centrilobular emphysema: Secondary | ICD-10-CM

## 2022-07-04 DIAGNOSIS — E6609 Other obesity due to excess calories: Secondary | ICD-10-CM

## 2022-07-04 DIAGNOSIS — K760 Fatty (change of) liver, not elsewhere classified: Secondary | ICD-10-CM

## 2022-07-04 LAB — MICROALBUMIN, URINE WAIVED
Creatinine, Urine Waived: 10 mg/dL (ref 10–300)
Microalb, Ur Waived: 10 mg/L (ref 0–19)
Microalb/Creat Ratio: <30 mg/g (ref <30)

## 2022-07-04 LAB — BAYER DCA HB A1C WAIVED: HB A1C (BAYER DCA - WAIVED): 6 % — ABNORMAL HIGH (ref 4.8–5.6)

## 2022-07-04 NOTE — Assessment & Plan Note (Signed)
Chronic, ongoing.  Continue supplement at home and recheck level today. 

## 2022-07-04 NOTE — Assessment & Plan Note (Signed)
Noted on past labs, recheck today and initiate medication as needed. The 10-year ASCVD risk score (Arnett DK, et al., 2019) is: 8%   Values used to calculate the score:     Age: 62 years     Sex: Female     Is Non-Hispanic African American: No     Diabetic: No     Tobacco smoker: Yes     Systolic Blood Pressure: 135 mmHg     Is BP treated: No     HDL Cholesterol: 53 mg/dL     Total Cholesterol: 208 mg/dL

## 2022-07-04 NOTE — Assessment & Plan Note (Signed)
Noted on labs May 2021 and April 2022 with calculated R Factor 3.0 (mixed) and fatty liver noted on  imaging + questionable cirrhosis on past CT, recent was stable.  Recommend continued cessation of alcohol use.  Recheck CMP.  Recommend avoiding Tylenol use at home.

## 2022-07-04 NOTE — Assessment & Plan Note (Signed)
Stopped in 2021, praised for continued cessation.  Recommend to continue cessation and yearly lung screens.

## 2022-07-04 NOTE — Assessment & Plan Note (Addendum)
Chronic, ongoing.  Quit smoking > 1 1/2 years ago, praised for continued cessation.  At this time no inhaler regimen and overall no symptoms.  Will plan on spirometry next visit and recommend to continue yearly lung screening until age 62.  Return in 6 months.

## 2022-07-04 NOTE — Assessment & Plan Note (Signed)
BMI 28.71.  Recommended eating smaller high protein, low fat meals more frequently and exercising 30 mins a day 5 times a week with a goal of 10-15lb weight loss in the next 3 months. Patient voiced their understanding and motivation to adhere to these recommendations.

## 2022-07-04 NOTE — Assessment & Plan Note (Signed)
Noted on labs November 2023 at 5.8%.  Today is 6%.  Recommend heavy focus on diet and exercise changes.  Discussed at length with patient.

## 2022-07-04 NOTE — Assessment & Plan Note (Signed)
Ongoing on u/s noted.  Recommend continued cessation of alcohol intake and focus on health diet at home, reducing processed foods.  Recheck labs today. 

## 2022-07-04 NOTE — Assessment & Plan Note (Signed)
Chronic, stable.  Remains stable without medication since stopping smoking and alcohol use.  Praised for this.  Continue diet focus at home.  Recommend she monitor BP at least a few mornings a week at home and document.  DASH diet at home.  Labs today: CMP, urine ALB.  Urine ALB 01 Jul 2022.  Return in 6 months.

## 2022-07-04 NOTE — Progress Notes (Signed)
BP 135/85   Pulse 99   Temp 98.3 F (36.8 C) (Oral)   Ht 5' (1.524 m)   Wt 147 lb (66.7 kg)   LMP  (LMP Unknown)   SpO2 96%   BMI 28.71 kg/m    Subjective:    Patient ID: Deanna Velez, female    DOB: 1960/11/10, 62 y.o.   MRN: [id number]  HPI: Deanna Velez is a 62 y.o. female  Chief Complaint  Patient presents with   Hypertension   Anxiety   COPD   HYPERTENSION / HYPERLIPIDEMIA Currently not taking any medications, took in the past. Quit smoking in 2021 and quit drinking alcohol 18 months.  History of elevation on A1c 5.8% November 2023.   Satisfied with current treatment? yes Duration of hyperlipidemia: chronic Cholesterol medication side effects: no Cholesterol supplements: none Medication compliance: good compliance Aspirin: no Recent stressors: no Recurrent headaches: no Visual changes: no Palpitations: no Dyspnea: rarely Chest pain: no Lower extremity edema: no Dizzy/lightheaded: no  The 10-year ASCVD risk score (Arnett DK, et al., 2019) is: 8%   Values used to calculate the score:     Age: 34 years     Sex: Female     Is Non-Hispanic African American: No     Diabetic: No     Tobacco smoker: Yes     Systolic Blood Pressure: 135 mmHg     Is BP treated: No     HDL Cholesterol: 53 mg/dL     Total Cholesterol: 208 mg/dL   COPD Last scan on 9/56/21 = centrilobular and paraseptal emphysema + aortic atherosclerosis.  Quit smoking in 2021, she smoked for 35 years. COPD status: stable Satisfied with current treatment?: yes Oxygen use: no Dyspnea frequency: occasional Cough frequency: none Rescue inhaler frequency:  none Limitation of activity: no Productive cough: none Last Spirometry: none Pneumovax: Up to Date Influenza: Up to Date   DEPRESSION Currently is having stressors -- her son is at home, he is an alcoholic and working on assisting = Step 1.  He is 61 years old.   Mood status: stable Satisfied with current treatment?:  yes Psychotherapy/counseling: not present Depressed mood: no Anxious mood: yes Anhedonia: no Significant weight loss or gain: no Insomnia: yes hard to stay asleep Fatigue: no Feelings of worthlessness or guilt: no Impaired concentration/indecisiveness: no Suicidal ideations: no Hopelessness: no Crying spells: no    07/04/2022    2:09 PM 01/03/2022    1:57 PM 06/02/2021   11:36 AM 05/25/2020    1:15 PM 10/04/2018    1:54 PM  Depression screen PHQ 2/9  Decreased Interest 0 0 0 0 0  Down, Depressed, Hopeless 0 0 0 0 0  PHQ - 2 Score 0 0 0 0 0  Altered sleeping 0 0 0 0 0  Tired, decreased energy 0 0 0 0 0  Change in appetite 0 0 0 0 0  Feeling bad or failure about yourself  0 0 0 0 0  Trouble concentrating 0 0 0 0 0  Moving slowly or fidgety/restless 0 0 0 0 0  Suicidal thoughts 0 0 0 0 0  PHQ-9 Score 0 0 0 0 0  Difficult doing work/chores Not difficult at all Not difficult at all          07/04/2022    2:09 PM 01/03/2022    1:57 PM 06/02/2021   11:37 AM 10/04/2018    1:54 PM  GAD 7 : Generalized Anxiety Score  Nervous, Anxious, on  Edge 0 0 0 0  Control/stop worrying 0 0 0 0  Worry too much - different things 0 0 0 0  Trouble relaxing 0 0 0 0  Restless 0 0 0 0  Easily annoyed or irritable 0 0 0 0  Afraid - awful might happen 0 0 0 0  Total GAD 7 Score 0 0 0 0  Anxiety Difficulty Not difficult at all Not difficult at all Not difficult at all       Relevant past medical, surgical, family and social history reviewed and updated as indicated. Interim medical history since our last visit reviewed. Allergies and medications reviewed and updated.  Review of Systems  Constitutional:  Negative for activity change, appetite change, diaphoresis, fatigue and fever.  Respiratory:  Negative for cough, chest tightness, shortness of breath and wheezing.   Cardiovascular:  Negative for chest pain, palpitations and leg swelling.  Gastrointestinal:  Negative for abdominal distention,  abdominal pain, blood in stool, constipation, diarrhea, nausea, rectal pain and vomiting.  Endocrine: Negative.   Neurological: Negative.   Psychiatric/Behavioral: Negative.      Per HPI unless specifically indicated above     Objective:    BP 135/85   Pulse 99   Temp 98.3 F (36.8 C) (Oral)   Ht 5' (1.524 m)   Wt 147 lb (66.7 kg)   LMP  (LMP Unknown)   SpO2 96%   BMI 28.71 kg/m   Wt Readings from Last 3 Encounters:  07/04/22 147 lb (66.7 kg)  06/06/22 140 lb (63.5 kg)  01/03/22 148 lb 4.8 oz (67.3 kg)    Physical Exam Vitals and nursing note reviewed.  Constitutional:      General: She is awake. She is not in acute distress.    Appearance: She is well-developed and well-groomed. She is obese. She is not ill-appearing or toxic-appearing.  HENT:     Head: Normocephalic.     Right Ear: Hearing normal.     Left Ear: Hearing normal.  Eyes:     General: Lids are normal.        Right eye: No discharge.        Left eye: No discharge.     Conjunctiva/sclera: Conjunctivae normal.     Pupils: Pupils are equal, round, and reactive to light.  Neck:     Thyroid: No thyromegaly.     Vascular: No carotid bruit.  Cardiovascular:     Rate and Rhythm: Normal rate and regular rhythm.     Heart sounds: Normal heart sounds. No murmur heard.    No gallop.  Pulmonary:     Effort: Pulmonary effort is normal.     Breath sounds: Normal breath sounds.  Abdominal:     General: Bowel sounds are normal. There is no distension.     Palpations: Abdomen is soft. There is no hepatomegaly.     Tenderness: There is no abdominal tenderness.     Hernia: No hernia is present.  Musculoskeletal:     Cervical back: Normal range of motion and neck supple.     Right lower leg: No edema.     Left lower leg: No edema.  Lymphadenopathy:     Cervical: No cervical adenopathy.  Skin:    General: Skin is warm and dry.  Neurological:     Mental Status: She is alert and oriented to person, place, and  time.  Psychiatric:        Attention and Perception: Attention normal.  Mood and Affect: Mood normal.        Speech: Speech normal.        Behavior: Behavior normal. Behavior is cooperative.        Thought Content: Thought content normal.     Results for orders placed or performed in visit on 01/03/22  CBC with Differential/Platelet  Result Value Ref Range   WBC 8.6 3.4 - 10.8 x10E3/uL   RBC 4.49 3.77 - 5.28 x10E6/uL   Hemoglobin 12.5 11.1 - 15.9 g/dL   Hematocrit 16.1 09.6 - 46.6 %   MCV 84 79 - 97 fL   MCH 27.8 26.6 - 33.0 pg   MCHC 33.0 31.5 - 35.7 g/dL   RDW 04.5 40.9 - 81.1 %   Platelets 283 150 - 450 x10E3/uL   Neutrophils 61 Not Estab. %   Lymphs 32 Not Estab. %   Monocytes 4 Not Estab. %   Eos 2 Not Estab. %   Basos 1 Not Estab. %   Neutrophils Absolute 5.2 1.4 - 7.0 x10E3/uL   Lymphocytes Absolute 2.8 0.7 - 3.1 x10E3/uL   Monocytes Absolute 0.4 0.1 - 0.9 x10E3/uL   EOS (ABSOLUTE) 0.2 0.0 - 0.4 x10E3/uL   Basophils Absolute 0.1 0.0 - 0.2 x10E3/uL   Immature Granulocytes 0 Not Estab. %   Immature Grans (Abs) 0.0 0.0 - 0.1 x10E3/uL  Comprehensive metabolic panel  Result Value Ref Range   Glucose 83 70 - 99 mg/dL   BUN 10 8 - 27 mg/dL   Creatinine, Ser 9.14 0.57 - 1.00 mg/dL   eGFR 75 >78 GN/FAO/1.30   BUN/Creatinine Ratio 11 (L) 12 - 28   Sodium 136 134 - 144 mmol/L   Potassium 4.0 3.5 - 5.2 mmol/L   Chloride 102 96 - 106 mmol/L   CO2 22 20 - 29 mmol/L   Calcium 10.1 8.7 - 10.3 mg/dL   Total Protein 7.7 6.0 - 8.5 g/dL   Albumin 5.0 (H) 3.8 - 4.9 g/dL   Globulin, Total 2.7 1.5 - 4.5 g/dL   Albumin/Globulin Ratio 1.9 1.2 - 2.2   Bilirubin Total 0.4 0.0 - 1.2 mg/dL   Alkaline Phosphatase 110 44 - 121 IU/L   AST 23 0 - 40 IU/L   ALT 29 0 - 32 IU/L  Lipid Panel w/o Chol/HDL Ratio  Result Value Ref Range   Cholesterol, Total 208 (H) 100 - 199 mg/dL   Triglycerides 865 0 - 149 mg/dL   HDL 53 >78 mg/dL   VLDL Cholesterol Cal 19 5 - 40 mg/dL   LDL Chol  Calc (NIH) 136 (H) 0 - 99 mg/dL  VITAMIN D 25 Hydroxy (Vit-D Deficiency, Fractures)  Result Value Ref Range   Vit D, 25-Hydroxy 20.8 (L) 30.0 - 100.0 ng/mL  TSH  Result Value Ref Range   TSH 0.582 0.450 - 4.500 uIU/mL  HgB A1c  Result Value Ref Range   Hgb A1c MFr Bld 5.8 (H) 4.8 - 5.6 %   Est. average glucose Bld gHb Est-mCnc 120 mg/dL      Assessment & Plan:   Problem List Items Addressed This Visit       Cardiovascular and Mediastinum   Hypertension    Chronic, stable.  Remains stable without medication since stopping smoking and alcohol use.  Praised for this.  Continue diet focus at home.  Recommend she monitor BP at least a few mornings a week at home and document.  DASH diet at home.  Labs today: CMP, urine ALB.  Urine ALB 01 Jul 2022.  Return in 6 months.       Relevant Orders   Microalbumin, Urine Waived   Comprehensive metabolic panel     Respiratory   Centrilobular emphysema (HCC) - Primary    Chronic, ongoing.  Quit smoking > 1 1/2 years ago, praised for continued cessation.  At this time no inhaler regimen and overall no symptoms.  Will plan on spirometry next visit and recommend to continue yearly lung screening until age 23.  Return in 6 months.        Digestive   Fatty liver    Ongoing on u/s noted.  Recommend continued cessation of alcohol intake and focus on health diet at home, reducing processed foods.  Recheck labs today.      Relevant Orders   Comprehensive metabolic panel     Other   Elevated hemoglobin A1c measurement    Noted on labs November 2023 at 5.8%.  Today is 6%.  Recommend heavy focus on diet and exercise changes.  Discussed at length with patient.      Relevant Orders   Bayer DCA Hb A1c Waived   Microalbumin, Urine Waived   Elevated liver enzymes    Noted on labs May 2021 and April 2022 with calculated R Factor 3.0 (mixed) and fatty liver noted on  imaging + questionable cirrhosis on past CT, recent was stable.  Recommend continued  cessation of alcohol use.  Recheck CMP.  Recommend avoiding Tylenol use at home.      Relevant Orders   Comprehensive metabolic panel   Gamma GT   Elevated low density lipoprotein (LDL) cholesterol level    Noted on past labs, recheck today and initiate medication as needed. The 10-year ASCVD risk score (Arnett DK, et al., 2019) is: 8%   Values used to calculate the score:     Age: 49 years     Sex: Female     Is Non-Hispanic African American: No     Diabetic: No     Tobacco smoker: Yes     Systolic Blood Pressure: 135 mmHg     Is BP treated: No     HDL Cholesterol: 53 mg/dL     Total Cholesterol: 208 mg/dL       Relevant Orders   Comprehensive metabolic panel   Lipid Panel w/o Chol/HDL Ratio   Former smoker, stopped smoking many years ago    Stopped in 2021, praised for continued cessation.  Recommend to continue cessation and yearly lung screens.      Obesity    BMI 28.71.  Recommended eating smaller high protein, low fat meals more frequently and exercising 30 mins a day 5 times a week with a goal of 10-15lb weight loss in the next 3 months. Patient voiced their understanding and motivation to adhere to these recommendations.       Vitamin D deficiency    Chronic, ongoing.  Continue supplement at home and recheck level today.      Relevant Orders   VITAMIN D 25 Hydroxy (Vit-D Deficiency, Fractures)     Follow up plan: Return in about 6 months (around 01/04/2023) for ANNUAL PHYSICAL AFTER 01/04/23.

## 2022-07-05 ENCOUNTER — Other Ambulatory Visit: Payer: Self-pay | Admitting: Nurse Practitioner

## 2022-07-05 ENCOUNTER — Encounter: Payer: Self-pay | Admitting: Nurse Practitioner

## 2022-07-05 DIAGNOSIS — I700 Atherosclerosis of aorta: Secondary | ICD-10-CM | POA: Insufficient documentation

## 2022-07-05 LAB — COMPREHENSIVE METABOLIC PANEL
ALT: 25 IU/L (ref 0–32)
AST: 27 IU/L (ref 0–40)
Albumin/Globulin Ratio: 1.8 (ref 1.2–2.2)
Albumin: 5.1 g/dL — ABNORMAL HIGH (ref 3.8–4.9)
Alkaline Phosphatase: 105 IU/L (ref 44–121)
BUN/Creatinine Ratio: 7 — ABNORMAL LOW (ref 12–28)
BUN: 7 mg/dL — ABNORMAL LOW (ref 8–27)
Bilirubin Total: 0.5 mg/dL (ref 0.0–1.2)
CO2: 22 mmol/L (ref 20–29)
Calcium: 10.6 mg/dL — ABNORMAL HIGH (ref 8.7–10.3)
Chloride: 103 mmol/L (ref 96–106)
Creatinine, Ser: 0.96 mg/dL (ref 0.57–1.00)
Globulin, Total: 2.9 g/dL (ref 1.5–4.5)
Glucose: 90 mg/dL (ref 70–99)
Potassium: 4.2 mmol/L (ref 3.5–5.2)
Sodium: 140 mmol/L (ref 134–144)
Total Protein: 8 g/dL (ref 6.0–8.5)
eGFR: 68 mL/min/{1.73_m2} (ref >59)

## 2022-07-05 LAB — LIPID PANEL W/O CHOL/HDL RATIO
Cholesterol, Total: 233 mg/dL — ABNORMAL HIGH (ref 100–199)
HDL: 58 mg/dL (ref >39)
LDL Chol Calc (NIH): 154 mg/dL — ABNORMAL HIGH (ref 0–99)
Triglycerides: 119 mg/dL (ref 0–149)
VLDL Cholesterol Cal: 21 mg/dL (ref 5–40)

## 2022-07-05 LAB — GAMMA GT: GGT: 76 IU/L — ABNORMAL HIGH (ref 0–60)

## 2022-07-05 LAB — VITAMIN D 25 HYDROXY (VIT D DEFICIENCY, FRACTURES): Vit D, 25-Hydroxy: 19.5 ng/mL — ABNORMAL LOW (ref 30.0–100.0)

## 2022-07-05 MED ORDER — ROSUVASTATIN CALCIUM 10 MG PO TABS: 10.0000 mg | ORAL_TABLET | Freq: Every day | ORAL | 3 refills | Status: DC

## 2022-07-05 NOTE — Progress Notes (Signed)
Contacted via MyChart -- needs 8 week follow-up in office with me for HLD --started statin The 10-year ASCVD risk score (Arnett DK, et al., 2019) is: 8.3%   Values used to calculate the score:     Age: 62 years     Sex: Female     Is Non-Hispanic African American: No     Diabetic: No     Tobacco smoker: Yes     Systolic Blood Pressure: 135 mmHg     Is BP treated: No     HDL Cholesterol: 58 mg/dL     Total Cholesterol: 233 mg/dL   Good evening Deanna Velez, your labs have returned: - Kidney function, creatinine and eGFR, remains normal, as is liver function, AST and ALT. GGT, which often looks at gall bladder, remains mildly elevated, but continues to trend down towards normal.:) - Calcium mildly elevated -- if taking calcium supplement try cutting back to every other day on this.  We will recheck next visit. - Vitamin D remains low, are you taking Vitamin D3 2000 units daily?  If not please start this for overall bone and muscle health.  Please let me know if you are taking, this is over the counter in vitamin section. - Cholesterol labs remain elevated -- I do recommend we start a low dose of Rosuvastatin for prevention purposes.  I will send this in to start at 10 MG nightly and we can adjust as needed.  Would like to see you back in 8 weeks to see how you are tolerating and repeat labs, will have staff call to schedule.  Any questions? Keep being amazing!!  Thank you for allowing me to participate in your care.  I appreciate you. Kindest regards, Deanna Velez

## 2022-07-06 NOTE — Progress Notes (Signed)
Attempted to reach patient to schedule her 8 week f/u regarding HLD, LVM to call office back.  Also put in CRM.

## 2022-07-11 ENCOUNTER — Telehealth: Payer: Self-pay | Admitting: Nurse Practitioner

## 2022-07-11 NOTE — Telephone Encounter (Unsigned)
Copied from CRM [id number]. Topic: General - Other >> Jul 11, 2022  8:26 AM Franchot Heidelberg wrote: Reason for CRM: Pt called stating that she has a question regarding her son. She is seeking the name of the location for psychiatry in [place] that they discussed last week. Says this place offers virtual appts.   Best contact: [phone]

## 2022-07-12 ENCOUNTER — Encounter: Payer: Self-pay | Admitting: Nurse Practitioner

## 2022-07-12 NOTE — Telephone Encounter (Signed)
Patient made aware of Provider's recommendations and verbalized understanding.   

## 2023-01-01 NOTE — Patient Instructions (Signed)
 Be Involved in Caring For Your Health:  Taking Medications When medications are taken as directed, they can greatly improve your health. But if they are not taken as prescribed, they may not work. In some cases, not taking them correctly can be harmful. To help ensure your treatment remains effective and safe, understand your medications and how to take them. Bring your medications to each visit for review by your provider.  Your lab results, notes, and after visit summary will be available on My Chart. We strongly encourage you to use this feature. If lab results are abnormal the clinic will contact you with the appropriate steps. If the clinic does not contact you assume the results are satisfactory. You can always view your results on My Chart. If you have questions regarding your health or results, please contact the clinic during office hours. You can also ask questions on My Chart.  We at [facility] are grateful that you chose [place] to provide your care. We strive to provide evidence-based and compassionate care and are always looking for feedback. If you get a survey from the clinic please complete this so we can hear your opinions.  Eating Plan for Chronic Obstructive Pulmonary Disease Chronic obstructive pulmonary disease (COPD) causes symptoms such as shortness of breath, coughing, and chest discomfort. These symptoms can make it difficult to eat enough to maintain a healthy weight. Generally, people with COPD should eat a diet that is high in calories, protein, and other nutrients to maintain body weight and to keep the lungs as healthy as possible. Depending on the medicines you take and other health conditions you may have, your health care provider may give you additional recommendations on what to eat or avoid. Talk with your health care provider about your goals for body weight, and work with a dietitian to develop an eating plan that is right for you. What are tips for  following this plan? Reading food labels  Avoid foods with more than 300 milligrams (mg) of salt (sodium) per serving. Choose foods that contain at least 4 grams (g) of fiber per serving. Try to eat 20-30 g of fiber each day. Choose foods that are high in calories and protein, such as nuts, beans, yogurt, and cheese. Shopping Do not buy foods labeled as diet, low-calorie, or low-fat. If you are able to eat dairy products: Avoid low-fat or skim milk. Buy dairy products that have at least 2% fat. Buy nutritional supplement drinks. Buy grains and prepared foods labeled as enriched or fortified. Consider buying low-sodium, pre-made foods to conserve energy for eating. Cooking Add dry milk or protein powder to smoothies. Cook with healthy fats, such as olive oil, canola oil, sunflower oil, and grapeseed oil. Add oil, butter, cream cheese, or nut butters to foods to increase fat and calories. To make foods easier to chew and swallow: Cook vegetables, pasta, and rice until soft. Cut or grind meat into very small pieces. Dip breads in liquid. Meal planning  Eat when you feel hungry. Eat 5-6 small meals throughout the day. Drink 6-8 glasses of water each day. Do not drink liquids with meals. Drink liquids at the end of the meal to avoid feeling full too quickly. Eat a variety of fruits and vegetables every day. Ask for assistance from family or friends with planning and preparing meals as needed. Avoid foods that cause you to feel bloated, such as carbonated drinks, fried foods, beans, broccoli, cabbage, and apples. For older adults, ask your local  agency on aging whether you are eligible for meal assistance programs, such as Meals on Wheels. Lifestyle  Do not smoke. Eat slowly. Take small bites and chew food well before swallowing. Do not overeat. This may make it more difficult to breathe after eating. Sit up while eating. If needed, continue to use supplemental oxygen while  eating. Rest or relax for 30 minutes before and after eating. Monitor your weight as told by your health care provider. Exercise as told by your health care provider. What foods should I eat? Fruits All fresh, dried, canned, or frozen fruits that do not cause gas. Vegetables All fresh, canned (no salt added), or frozen vegetables that do not cause gas. Grains Whole-grain bread. Enriched whole-grain pasta. Fortified whole-grain cereals. Fortified rice. Quinoa. Meats and other proteins Lean meat. Poultry. Fish. Dried beans. Unsalted nuts. Tofu. Eggs. Nut butters. Dairy Whole or 2% milk. Cheese. Yogurt. Fats and oils Olive oil. Canola oil. Butter. Margarine. Beverages Water. Vegetable juice (no salt added). Decaffeinated coffee. Decaffeinated or herbal tea. Seasonings and condiments Fresh or dried herbs. Low-salt or salt-free seasonings. Low-sodium soy sauce. The items listed above may not be a complete list of foods and beverages you can eat. Contact a dietitian for more information. What foods should I avoid? Fruits Fruits that cause gas, such as apples or melon. Vegetables Vegetables that cause gas, such as broccoli, Brussels sprouts, cabbage, cauliflower, and onions. Canned vegetables with added salt. Meats and other proteins Fried meat. Salt-cured meat. Processed meat. Dairy Fat-free or low-fat milk, yogurt, or cheese. Processed cheese. Beverages Carbonated drinks. Caffeinated drinks, such as coffee, tea, and soft drinks. Juice. Alcohol. Vegetable juice with added salt. Seasonings and condiments Salt. Seasoning mixes with salt. Soy sauce. Rosita Fire. Other foods Clear soup or broth. Fried foods. Prepared frozen meals. The items listed above may not be a complete list of foods and beverages you should avoid. Contact a dietitian for more information. Summary COPD symptoms can make it difficult to eat enough to maintain a healthy weight. A COPD eating plan can help you maintain  your body weight and keep your lungs as healthy as possible. Eat a diet that is high in calories, protein, and other nutrients. Read labels to make sure that you are getting the right nutrients. Cook foods to make them easier to chew and swallow. Eat 5-6 small meals throughout the day, and avoid foods that cause gas or make you feel bloated. This information is not intended to replace advice given to you by your health care provider. Make sure you discuss any questions you have with your health care provider. Document Revised: 12/17/2019 Document Reviewed: 12/17/2019 Elsevier Patient Education  2024 [employer].

## 2023-01-06 ENCOUNTER — Ambulatory Visit (INDEPENDENT_AMBULATORY_CARE_PROVIDER_SITE_OTHER): Payer: BC Managed Care – PPO | Admitting: Nurse Practitioner

## 2023-01-06 ENCOUNTER — Encounter: Payer: Self-pay | Admitting: Nurse Practitioner

## 2023-01-06 VITALS — BP 128/79 | HR 65 | Temp 98.6°F | Ht 59.5 in | Wt 146.2 lb

## 2023-01-06 DIAGNOSIS — I700 Atherosclerosis of aorta: Secondary | ICD-10-CM

## 2023-01-06 DIAGNOSIS — Z6829 Body mass index (BMI) 29.0-29.9, adult: Secondary | ICD-10-CM

## 2023-01-06 DIAGNOSIS — I10 Essential (primary) hypertension: Secondary | ICD-10-CM

## 2023-01-06 DIAGNOSIS — E6609 Other obesity due to excess calories: Secondary | ICD-10-CM

## 2023-01-06 DIAGNOSIS — Z0000 Encounter for general adult medical examination without abnormal findings: Secondary | ICD-10-CM

## 2023-01-06 DIAGNOSIS — R7309 Other abnormal glucose: Secondary | ICD-10-CM

## 2023-01-06 DIAGNOSIS — K760 Fatty (change of) liver, not elsewhere classified: Secondary | ICD-10-CM

## 2023-01-06 DIAGNOSIS — Z87891 Personal history of nicotine dependence: Secondary | ICD-10-CM

## 2023-01-06 DIAGNOSIS — J432 Centrilobular emphysema: Secondary | ICD-10-CM

## 2023-01-06 DIAGNOSIS — Z1231 Encounter for screening mammogram for malignant neoplasm of breast: Secondary | ICD-10-CM

## 2023-01-06 DIAGNOSIS — E559 Vitamin D deficiency, unspecified: Secondary | ICD-10-CM

## 2023-01-06 DIAGNOSIS — E782 Mixed hyperlipidemia: Secondary | ICD-10-CM | POA: Diagnosis not present

## 2023-01-06 DIAGNOSIS — Z124 Encounter for screening for malignant neoplasm of cervix: Secondary | ICD-10-CM

## 2023-01-06 DIAGNOSIS — Z23 Encounter for immunization: Secondary | ICD-10-CM | POA: Diagnosis not present

## 2023-01-06 DIAGNOSIS — F418 Other specified anxiety disorders: Secondary | ICD-10-CM

## 2023-01-06 LAB — BAYER DCA HB A1C WAIVED: HB A1C (BAYER DCA - WAIVED): 5.8 % — ABNORMAL HIGH (ref 4.8–5.6)

## 2023-01-06 NOTE — Assessment & Plan Note (Signed)
Chronic, stable.  Remains stable without medication since stopping smoking and alcohol use.  Praised for this.  Continue diet focus at home.  Recommend she monitor BP at least a few mornings a week at home and document.  DASH diet at home.  Labs today: CMP, CBC, TSH.  Urine ALB 01 Jul 2022.  Return in 6 months.

## 2023-01-06 NOTE — Assessment & Plan Note (Signed)
Ongoing.  Noted on labs November 2023 at 5.8%.  Today is 5.8%.  Recommend heavy focus on diet and exercise changes.  Discussed at length with patient.

## 2023-01-06 NOTE — Assessment & Plan Note (Signed)
Noted on past labs, recheck today and initiate medication as needed. She never start Rosuvastatin. The 10-year ASCVD risk score (Arnett DK, et al., 2019) is: 4%   Values used to calculate the score:     Age: 62 years     Sex: Female     Is Non-Hispanic African American: No     Diabetic: No     Tobacco smoker: No     Systolic Blood Pressure: 128 mmHg     Is BP treated: No     HDL Cholesterol: 58 mg/dL     Total Cholesterol: 233 mg/dL

## 2023-01-06 NOTE — Assessment & Plan Note (Signed)
Noted on labs May 2021 and April 2022 with calculated R Factor 3.0 (mixed) and fatty liver noted on  imaging + questionable cirrhosis on past CT, recent was stable.  Recommend continued cessation of alcohol use.  Recheck CMP.  Recommend avoiding Tylenol use at home.

## 2023-01-06 NOTE — Assessment & Plan Note (Signed)
Stopped in 2021, praised for continued cessation.  Recommend to continue cessation and yearly lung screens.

## 2023-01-06 NOTE — Assessment & Plan Note (Signed)
Chronic, ongoing.  Continue supplement at home and recheck level today. 

## 2023-01-06 NOTE — Assessment & Plan Note (Signed)
Ongoing on u/s noted.  Recommend continued cessation of alcohol intake and focus on health diet at home, reducing processed foods.  Recheck labs today. 

## 2023-01-06 NOTE — Assessment & Plan Note (Signed)
Chronic, ongoing.  Quit smoking > 1 1/2 years ago, praised for continued cessation.  At this time no inhaler regimen and overall minimal symptoms.  Will plan on spirometry next visit and recommend to continue yearly lung screening until age 62.  Return in 6 months.

## 2023-01-06 NOTE — Assessment & Plan Note (Addendum)
Ongoing, stable. Noted on CT lung screening.  Recommend initiation of statin, has been ordered in past but never took.

## 2023-01-06 NOTE — Progress Notes (Signed)
BP 128/79   Pulse 65   Temp 98.6 F (37 C) (Oral)   Ht 4' 11.5" (1.511 m)   Wt 146 lb 3.2 oz (66.3 kg)   LMP  (LMP Unknown)   SpO2 98%   BMI 29.03 kg/m    Subjective:    Patient ID: Deanna Velez, female    DOB: Jan 14, 1961, 62 y.o.   MRN: [id number]  HPI: Deanna Velez is a 62 y.o. female presenting on 01/06/2023 for comprehensive medical examination. Current medical complaints include: breast issues  She currently lives with: significant other Menopausal Symptoms: no  History of epistaxis as a child and tonsils removed, with nose bleeds stopping after this.  Then a few years ago had them again, stopped after she stopped drinking.  Recently had a couple nose bleeds again and wanted to talk about this.  No ASA use daily.  No other symptoms with this. No family history of bleeding disorders.  HYPERTENSION / HYPERLIPIDEMIA Currently not taking any medications.  Quit drinking 2 years as of January. Is not taking cholesterol medication at this time. Satisfied with current treatment? yes Duration of hyperlipidemia: chronic Cholesterol medication side effects: no Cholesterol supplements: none Medication compliance: good compliance Aspirin: no Recent stressors: no Recurrent headaches: no Visual changes: no Palpitations: no Dyspnea: occasional Chest pain: no Lower extremity edema: no Dizzy/lightheaded: no  The 10-year ASCVD risk score (Arnett DK, et al., 2019) is: 4%   Values used to calculate the score:     Age: 75 years     Sex: Female     Is Non-Hispanic African American: No     Diabetic: No     Tobacco smoker: No     Systolic Blood Pressure: 128 mmHg     Is BP treated: No     HDL Cholesterol: 58 mg/dL     Total Cholesterol: 233 mg/dL   COPD Goes for CT lung screening with last check 06/06/22 = centrilobular and paraseptal emphysema and aortic athersclerosis.  Quit smoking about 3-4 years ago. COPD status: stable Satisfied with current treatment?: yes Oxygen use:  no Dyspnea frequency: occasional Cough frequency: none Rescue inhaler frequency: none Limitation of activity: no Productive cough: none Last Spirometry: none Pneumovax: Up to Date Influenza: Up to Date   Depression Screen done today and results listed below:     01/06/2023    9:05 AM 07/04/2022    2:09 PM 01/03/2022    1:57 PM 06/02/2021   11:36 AM 05/25/2020    1:15 PM  Depression screen PHQ 2/9  Decreased Interest 0 0 0 0 0  Down, Depressed, Hopeless 0 0 0 0 0  PHQ - 2 Score 0 0 0 0 0  Altered sleeping 0 0 0 0 0  Tired, decreased energy 0 0 0 0 0  Change in appetite 0 0 0 0 0  Feeling bad or failure about yourself  0 0 0 0 0  Trouble concentrating 0 0 0 0 0  Moving slowly or fidgety/restless 0 0 0 0 0  Suicidal thoughts 0 0 0 0 0  PHQ-9 Score 0 0 0 0 0  Difficult doing work/chores Not difficult at all Not difficult at all Not difficult at all        01/06/2023    9:06 AM 07/04/2022    2:09 PM 01/03/2022    1:57 PM 06/02/2021   11:37 AM  GAD 7 : Generalized Anxiety Score  Nervous, Anxious, on Edge 0 0 0 0  Control/stop  worrying 0 0 0 0  Worry too much - different things 0 0 0 0  Trouble relaxing 0 0 0 0  Restless 0 0 0 0  Easily annoyed or irritable 0 0 0 0  Afraid - awful might happen 0 0 0 0  Total GAD 7 Score 0 0 0 0  Anxiety Difficulty  Not difficult at all Not difficult at all Not difficult at all      10/04/2018    1:53 PM 10/18/2018    2:16 PM 01/03/2022    1:51 PM 01/06/2023    8:46 AM 01/06/2023    9:06 AM  Fall Risk  Falls in the past year? 0 0 0 0 0  Was there an injury with Fall? 0 0 0 0 0  Fall Risk Category Calculator 0 0 0 0 0  Fall Risk Category (Retired) Low Low Low    (RETIRED) Patient Fall Risk Level Low fall risk      Patient at Risk for Falls Due to   No Fall Risks No Fall Risks No Fall Risks  Fall risk Follow up   Falls evaluation completed Falls evaluation completed Falls prevention discussed    Functional Status Survey: Is the patient  deaf or have difficulty hearing?: No Does the patient have difficulty seeing, even when wearing glasses/contacts?: No Does the patient have difficulty concentrating, remembering, or making decisions?: No Does the patient have difficulty walking or climbing stairs?: No Does the patient have difficulty dressing or bathing?: No Does the patient have difficulty doing errands alone such as visiting a doctor's office or shopping?: No    Past Medical History:  Past Medical History:  Diagnosis Date   Gallbladder attack    Hypertension    Lack of libido    PMB (postmenopausal bleeding)    Ringworm    Vitamin D deficiency disease     Surgical History:  Past Surgical History:  Procedure Laterality Date   TOE SURGERY Right 05/2014   TONSILLECTOMY     TUBAL LIGATION      Medications:  Current Outpatient Medications on File Prior to Visit  Medication Sig   EPINEPHrine (EPIPEN 2-PAK) 0.3 mg/0.3 mL IJ SOAJ injection Inject 0.3 mLs (0.3 mg total) into the muscle as needed for anaphylaxis.   No current facility-administered medications on file prior to visit.    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)   Beef-Derived Products    Pork-Derived Products     Social History:  Social History   Socioeconomic History   Marital status: Married    Spouse name: Not on file   Number of children: Not on file   Years of education: Not on file   Highest education level: Not on file  Occupational History   Not on file  Tobacco Use   Smoking status: Former    Current packs/day: 0.75    Average packs/day: 0.8 packs/day for 36.0 years (27.0 ttl pk-yrs)    Types: Cigarettes   Smokeless tobacco: Never   Tobacco comments:    patient stated she stopped smoking 2 months ago  Vaping Use   Vaping status: Never Used  Substance and Sexual Activity   Alcohol use: Not Currently    Comment: no  alcohol in 2 years per patient   Drug use: No   Sexual activity: Yes    Birth control/protection: Surgical  Comment: tubal   Other Topics Concern   Not on file  Social History Narrative   Not on file   Social Determinants of Health   Financial Resource Strain: Not on file  Food Insecurity: Not on file  Transportation Needs: Not on file  Physical Activity: Not on file  Stress: Not on file  Social Connections: Not on file  Intimate Partner Violence: Not on file   Social History   Tobacco Use  Smoking Status Former   Current packs/day: 0.75   Average packs/day: 0.8 packs/day for 36.0 years (27.0 ttl pk-yrs)   Types: Cigarettes  Smokeless Tobacco Never  Tobacco Comments   patient stated she stopped smoking 2 months ago   Social History   Substance and Sexual Activity  Alcohol Use Not Currently   Comment: no alcohol in 2 years per patient    Family History:  Family History  Problem Relation Age of Onset   Hyperlipidemia Mother    Diabetes Mother    Heart disease Mother    Cancer Sister        leukemia   Diabetes Sister    Diabetes Maternal Grandmother    Hyperlipidemia Sister    Diabetes Sister    Stroke Sister    Breast cancer Neg Hx    Past medical history, surgical history, medications, allergies, family history and social history reviewed with patient today and changes made to appropriate areas of the chart.   ROS All other ROS negative except what is listed above and in the HPI.      Objective:    BP 128/79   Pulse 65   Temp 98.6 F (37 C) (Oral)   Ht 4' 11.5" (1.511 m)   Wt 146 lb 3.2 oz (66.3 kg)   LMP  (LMP Unknown)   SpO2 98%   BMI 29.03 kg/m   Wt Readings from Last 3 Encounters:  01/06/23 146 lb 3.2 oz (66.3 kg)  07/04/22 147 lb (66.7 kg)  06/06/22 140 lb (63.5 kg)    Physical Exam Vitals and nursing note reviewed. Exam conducted with a chaperone present.  Constitutional:      General: She is awake. She is not in acute distress.     Appearance: Normal appearance. She is well-developed and well-groomed. She is not ill-appearing or toxic-appearing.  HENT:     Head: Normocephalic and atraumatic.     Right Ear: Hearing, tympanic membrane, ear canal and external ear normal. No drainage.     Left Ear: Hearing, tympanic membrane, ear canal and external ear normal. No drainage.     Nose: Nose normal.     Right Sinus: No maxillary sinus tenderness or frontal sinus tenderness.     Left Sinus: No maxillary sinus tenderness or frontal sinus tenderness.     Mouth/Throat:     Mouth: Mucous membranes are moist.     Pharynx: Oropharynx is clear. Uvula midline. No pharyngeal swelling, oropharyngeal exudate or posterior oropharyngeal erythema.  Eyes:     General: Lids are normal.        Right eye: No discharge.        Left eye: No discharge.     Extraocular Movements: Extraocular movements intact.     Conjunctiva/sclera: Conjunctivae normal.     Pupils: Pupils are equal, round, and reactive to light.     Visual Fields: Right eye visual fields normal and left eye visual fields normal.  Neck:     Thyroid: No thyromegaly.  Vascular: No carotid bruit.     Trachea: Trachea normal.  Cardiovascular:     Rate and Rhythm: Normal rate and regular rhythm.     Heart sounds: Normal heart sounds. No murmur heard.    No gallop.  Pulmonary:     Effort: Pulmonary effort is normal. No accessory muscle usage or respiratory distress.     Breath sounds: Normal breath sounds.  Chest:  Breasts:    Right: Normal.     Left: Normal.  Abdominal:     General: Bowel sounds are normal.     Palpations: Abdomen is soft. There is no hepatomegaly or splenomegaly.     Tenderness: There is no abdominal tenderness.  Musculoskeletal:        General: Normal range of motion.     Cervical back: Normal range of motion and neck supple.     Right lower leg: No edema.     Left lower leg: No edema.  Lymphadenopathy:     Head:     Right side of head: No  submental, submandibular, tonsillar, preauricular or posterior auricular adenopathy.     Left side of head: No submental, submandibular, tonsillar, preauricular or posterior auricular adenopathy.     Cervical: No cervical adenopathy.     Upper Body:     Right upper body: No supraclavicular, axillary or pectoral adenopathy.     Left upper body: No supraclavicular, axillary or pectoral adenopathy.  Skin:    General: Skin is warm and dry.     Capillary Refill: Capillary refill takes less than 2 seconds.     Findings: No rash.  Neurological:     Mental Status: She is alert and oriented to person, place, and time.     Gait: Gait is intact.     Deep Tendon Reflexes: Reflexes are normal and symmetric.     Reflex Scores:      Brachioradialis reflexes are 2+ on the right side and 2+ on the left side.      Patellar reflexes are 2+ on the right side and 2+ on the left side. Psychiatric:        Attention and Perception: Attention normal.        Mood and Affect: Mood normal.        Speech: Speech normal.        Behavior: Behavior normal. Behavior is cooperative.        Thought Content: Thought content normal.        Judgment: Judgment normal.    Results for orders placed or performed in visit on 01/06/23  Bayer DCA Hb A1c Waived  Result Value Ref Range   HB A1C (BAYER DCA - WAIVED) 5.8 (H) 4.8 - 5.6 %      Assessment & Plan:   Problem List Items Addressed This Visit       Cardiovascular and Mediastinum   Aortic atherosclerosis (HCC) (Chronic)    Ongoing, stable. Noted on CT lung screening.  Recommend initiation of statin, has been ordered in past but never took.      Relevant Orders   Comprehensive metabolic panel   Lipid Panel w/o Chol/HDL Ratio   Hypertension (Chronic)    Chronic, stable.  Remains stable without medication since stopping smoking and alcohol use.  Praised for this.  Continue diet focus at home.  Recommend she monitor BP at least a few mornings a week at home and  document.  DASH diet at home.  Labs today: CMP, CBC, TSH.  Urine  ALB 01 Jul 2022.  Return in 6 months.       Relevant Orders   CBC with Differential/Platelet   Comprehensive metabolic panel   TSH     Respiratory   Centrilobular emphysema (HCC) - Primary (Chronic)    Chronic, ongoing.  Quit smoking > 1 1/2 years ago, praised for continued cessation.  At this time no inhaler regimen and overall minimal symptoms.  Will plan on spirometry next visit and recommend to continue yearly lung screening until age 42.  Return in 6 months.      Relevant Orders   CBC with Differential/Platelet     Digestive   Fatty liver    Ongoing on u/s noted.  Recommend continued cessation of alcohol intake and focus on health diet at home, reducing processed foods.  Recheck labs today.      Relevant Orders   Comprehensive metabolic panel   Alkaline phosphatase   Gamma GT     Other   Elevated hemoglobin A1c measurement (Chronic)    Ongoing.  Noted on labs November 2023 at 5.8%.  Today is 5.8%.  Recommend heavy focus on diet and exercise changes.  Discussed at length with patient.      Relevant Orders   Bayer DCA Hb A1c Waived (Completed)   Former smoker, stopped smoking many years ago (Chronic)    Stopped in 2021, praised for continued cessation.  Recommend to continue cessation and yearly lung screens.      Mixed hyperlipidemia (Chronic)    Noted on past labs, recheck today and initiate medication as needed. She never start Rosuvastatin. The 10-year ASCVD risk score (Arnett DK, et al., 2019) is: 4%   Values used to calculate the score:     Age: 78 years     Sex: Female     Is Non-Hispanic African American: No     Diabetic: No     Tobacco smoker: No     Systolic Blood Pressure: 128 mmHg     Is BP treated: No     HDL Cholesterol: 58 mg/dL     Total Cholesterol: 233 mg/dL       Relevant Orders   Comprehensive metabolic panel   Lipid Panel w/o Chol/HDL Ratio   BMI 29.0-29.9,adult   Vitamin  D deficiency    Chronic, ongoing.  Continue supplement at home and recheck level today.      Relevant Orders   VITAMIN D 25 Hydroxy (Vit-D Deficiency, Fractures)   Other Visit Diagnoses     Situational anxiety       Referral to therapy to help work through her emotions with son who is addict.   Relevant Orders   Ambulatory referral to Psychology   Encounter for screening mammogram for malignant neoplasm of breast       Mammogram ordered   Relevant Orders   MM 3D SCREENING MAMMOGRAM BILATERAL BREAST   COVID-19 vaccine administered       Covid vaccine today, educated on this.   Relevant Orders   Pfizer Comirnaty Covid -19 Vaccine 44yrs and older (Completed)   Flu vaccine need       Flu vaccine today, educated patient.   Relevant Orders   Flu vaccine trivalent PF, 6mos and older(Flulaval,Afluria,Fluarix,Fluzone) (Completed)   Encounter for annual physical exam       Annual physical today with labs and health maintenance reviewed, discussed with patient.        Follow up plan: Return in about 6 months (around 07/06/2023) for  COPD, HTN/HLD.   LABORATORY TESTING:  - Pap smear:  refuses  -- does not want to do anymore  IMMUNIZATIONS:   - Tdap: Tetanus vaccination status reviewed: Td vaccination indicated and given today. - Influenza: Administered today - Pneumovax: Up to date - Prevnar: Not applicable - COVID: Up to date -- administered today - HPV: Not applicable - Shingrix vaccine: Refused  SCREENING: -Mammogram: To schedule - Colonoscopy: Up to date -- Cologuard next due 06/21/23 - Bone Density: Not applicable  -Hearing Test: Not applicable  -Spirometry: Not applicable   PATIENT COUNSELING:   Advised to take 1 mg of folate supplement per day if capable of pregnancy.   Sexuality: Discussed sexually transmitted diseases, partner selection, use of condoms, avoidance of unintended pregnancy  and contraceptive alternatives.   Advised to avoid cigarette smoking.  I  discussed with the patient that most people either abstain from alcohol or drink within safe limits (<=14/week and <=4 drinks/occasion for males, <=7/weeks and <= 3 drinks/occasion for females) and that the risk for alcohol disorders and other health effects rises proportionally with the number of drinks per week and how often a drinker exceeds daily limits.  Discussed cessation/primary prevention of drug use and availability of treatment for abuse.   Diet: Encouraged to adjust caloric intake to maintain  or achieve ideal body weight, to reduce intake of dietary saturated fat and total fat, to limit sodium intake by avoiding high sodium foods and not adding table salt, and to maintain adequate dietary potassium and calcium preferably from fresh fruits, vegetables, and low-fat dairy products.    Stressed the importance of regular exercise  Injury prevention: Discussed safety belts, safety helmets, smoke detector, smoking near bedding or upholstery.   Dental health: Discussed importance of regular tooth brushing, flossing, and dental visits.    NEXT PREVENTATIVE PHYSICAL DUE IN 1 YEAR. Return in about 6 months (around 07/06/2023) for COPD, HTN/HLD.

## 2023-01-07 LAB — CBC WITH DIFFERENTIAL/PLATELET
Basophils Absolute: 0.1 10*3/uL (ref 0.0–0.2)
Basos: 1 %
EOS (ABSOLUTE): 0.2 10*3/uL (ref 0.0–0.4)
Eos: 2 %
Hematocrit: 39.5 % (ref 34.0–46.6)
Hemoglobin: 12.4 g/dL (ref 11.1–15.9)
Immature Grans (Abs): 0 10*3/uL (ref 0.0–0.1)
Immature Granulocytes: 0 %
Lymphocytes Absolute: 2.6 10*3/uL (ref 0.7–3.1)
Lymphs: 40 %
MCH: 27.3 pg (ref 26.6–33.0)
MCHC: 31.4 g/dL — ABNORMAL LOW (ref 31.5–35.7)
MCV: 87 fL (ref 79–97)
Monocytes Absolute: 0.4 10*3/uL (ref 0.1–0.9)
Monocytes: 6 %
Neutrophils Absolute: 3.3 10*3/uL (ref 1.4–7.0)
Neutrophils: 51 %
Platelets: 329 10*3/uL (ref 150–450)
RBC: 4.54 x10E6/uL (ref 3.77–5.28)
RDW: 13.1 % (ref 11.7–15.4)
WBC: 6.6 10*3/uL (ref 3.4–10.8)

## 2023-01-07 LAB — COMPREHENSIVE METABOLIC PANEL
ALT: 18 [IU]/L (ref 0–32)
AST: 24 [IU]/L (ref 0–40)
Albumin: 4.8 g/dL (ref 3.9–4.9)
Alkaline Phosphatase: 105 [IU]/L (ref 44–121)
BUN/Creatinine Ratio: 11 — ABNORMAL LOW (ref 12–28)
BUN: 10 mg/dL (ref 8–27)
Bilirubin Total: 0.5 mg/dL (ref 0.0–1.2)
CO2: 22 mmol/L (ref 20–29)
Calcium: 9.9 mg/dL (ref 8.7–10.3)
Chloride: 102 mmol/L (ref 96–106)
Creatinine, Ser: 0.88 mg/dL (ref 0.57–1.00)
Globulin, Total: 2.6 g/dL (ref 1.5–4.5)
Glucose: 108 mg/dL — ABNORMAL HIGH (ref 70–99)
Potassium: 4.4 mmol/L (ref 3.5–5.2)
Sodium: 137 mmol/L (ref 134–144)
Total Protein: 7.4 g/dL (ref 6.0–8.5)
eGFR: 75 mL/min/{1.73_m2} (ref >59)

## 2023-01-07 LAB — LIPID PANEL W/O CHOL/HDL RATIO
Cholesterol, Total: 213 mg/dL — ABNORMAL HIGH (ref 100–199)
HDL: 59 mg/dL (ref >39)
LDL Chol Calc (NIH): 134 mg/dL — ABNORMAL HIGH (ref 0–99)
Triglycerides: 110 mg/dL (ref 0–149)
VLDL Cholesterol Cal: 20 mg/dL (ref 5–40)

## 2023-01-07 LAB — GAMMA GT: GGT: 54 [IU]/L (ref 0–60)

## 2023-01-07 LAB — VITAMIN D 25 HYDROXY (VIT D DEFICIENCY, FRACTURES): Vit D, 25-Hydroxy: 14.3 ng/mL — ABNORMAL LOW (ref 30.0–100.0)

## 2023-01-07 LAB — TSH: TSH: 0.87 u[IU]/mL (ref 0.450–4.500)

## 2023-01-08 ENCOUNTER — Encounter: Payer: Self-pay | Admitting: Nurse Practitioner

## 2023-01-08 NOTE — Progress Notes (Signed)
Contacted via MyChart   Good morning Janifer, your labs have returned: - CBC shows no anemia or infection. - Kidney function, creatinine and eGFR, remains normal, as is liver function, AST and ALT.  - Lipid panel continues to show elevation in LDL and total cholesterol.  I do recommend you start cholesterol medication, like Rosuvastatin, which I ordered in past for you.  Do you want me to send this back in for you to try? - Vitamin D level remains low, I do recommend you start taking Vitamin D3 2000 units daily for overall bone health.  You can get this over the counter. - Remainder of labs are stable.  Any questions? Keep being amazing!!  Thank you for allowing me to participate in your care.  I appreciate you. Kindest regards, Kenneth Lax

## 2023-01-09 MED ORDER — ROSUVASTATIN CALCIUM 10 MG PO TABS: 10.0000 mg | ORAL_TABLET | Freq: Every day | ORAL | 3 refills | Status: DC

## 2023-06-15 ENCOUNTER — Other Ambulatory Visit: Payer: Self-pay | Admitting: Acute Care

## 2023-06-15 ENCOUNTER — Ambulatory Visit
Admission: RE | Admit: 2023-06-15 | Discharge: 2023-06-15 | Disposition: A | Discharge status: Home / Self Care | Payer: Self-pay | Source: Ambulatory Visit | Attending: Nurse Practitioner | Admitting: Nurse Practitioner

## 2023-06-15 DIAGNOSIS — Z87891 Personal history of nicotine dependence: Secondary | ICD-10-CM

## 2023-06-15 DIAGNOSIS — Z1231 Encounter for screening mammogram for malignant neoplasm of breast: Secondary | ICD-10-CM

## 2023-06-15 DIAGNOSIS — Z122 Encounter for screening for malignant neoplasm of respiratory organs: Secondary | ICD-10-CM

## 2023-06-19 ENCOUNTER — Encounter: Payer: Self-pay | Admitting: Nurse Practitioner

## 2023-06-19 NOTE — Progress Notes (Signed)
 Contacted via MyChart   Normal mammogram, may repeat in one year:)

## 2023-06-29 ENCOUNTER — Ambulatory Visit
Admission: RE | Admit: 2023-06-29 | Discharge: 2023-06-29 | Disposition: A | Discharge status: Home / Self Care | Source: Ambulatory Visit | Attending: Acute Care | Admitting: Acute Care

## 2023-06-29 DIAGNOSIS — Z87891 Personal history of nicotine dependence: Secondary | ICD-10-CM | POA: Diagnosis present

## 2023-06-29 DIAGNOSIS — Z122 Encounter for screening for malignant neoplasm of respiratory organs: Secondary | ICD-10-CM | POA: Insufficient documentation

## 2023-07-08 NOTE — Patient Instructions (Signed)
 Be Involved in Caring For Your Health:  Taking Medications When medications are taken as directed, they can greatly improve your health. But if they are not taken as prescribed, they may not work. In some cases, not taking them correctly can be harmful. To help ensure your treatment remains effective and safe, understand your medications and how to take them. Bring your medications to each visit for review by your provider.  Your lab results, notes, and after visit summary will be available on My Chart. We strongly encourage you to use this feature. If lab results are abnormal the clinic will contact you with the appropriate steps. If the clinic does not contact you assume the results are satisfactory. You can always view your results on My Chart. If you have questions regarding your health or results, please contact the clinic during office hours. You can also ask questions on My Chart.  We at [facility] are grateful that you chose [place] to provide your care. We strive to provide evidence-based and compassionate care and are always looking for feedback. If you get a survey from the clinic please complete this so we can hear your opinions.  Heart-Healthy Eating Plan Many factors influence your heart health, including eating and exercise habits. Heart health is also called coronary health. Coronary risk increases with abnormal blood fat (lipid) levels. A heart-healthy eating plan includes limiting unhealthy fats, increasing healthy fats, limiting salt (sodium) intake, and making other diet and lifestyle changes. What is my plan? Your health care provider may recommend that: You limit your fat intake to _________% or less of your total calories each day. You limit your saturated fat intake to _________% or less of your total calories each day. You limit the amount of cholesterol in your diet to less than _________ mg per day. You limit the amount of sodium in your diet to less than _________  mg per day. What are tips for following this plan? Cooking Cook foods using methods other than frying. Baking, boiling, grilling, and broiling are all good options. Other ways to reduce fat include: Removing the skin from poultry. Removing all visible fats from meats. Steaming vegetables in water or broth. Meal planning  At meals, imagine dividing your plate into fourths: Fill one-half of your plate with vegetables and green salads. Fill one-fourth of your plate with whole grains. Fill one-fourth of your plate with lean protein foods. Eat 2-4 cups of vegetables per day. One cup of vegetables equals 1 cup (91 g) broccoli or cauliflower florets, 2 medium carrots, 1 large bell pepper, 1 large sweet potato, 1 large tomato, 1 medium white potato, 2 cups (150 g) raw leafy greens. Eat 1-2 cups of fruit per day. One cup of fruit equals 1 small apple, 1 large banana, 1 cup (237 g) mixed fruit, 1 large orange,  cup (82 g) dried fruit, 1 cup (240 mL) 100% fruit juice. Eat more foods that contain soluble fiber. Examples include apples, broccoli, carrots, beans, peas, and barley. Aim to get 25-30 g of fiber per day. Increase your consumption of legumes, nuts, and seeds to 4-5 servings per week. One serving of dried beans or legumes equals  cup (90 g) cooked, 1 serving of nuts is  oz (12 almonds, 24 pistachios, or 7 walnut halves), and 1 serving of seeds equals  oz (8 g). Fats Choose healthy fats more often. Choose monounsaturated and polyunsaturated fats, such as olive and canola oils, avocado oil, flaxseeds, walnuts, almonds, and seeds. Eat  more omega-3 fats. Choose salmon, mackerel, sardines, tuna, flaxseed oil, and ground flaxseeds. Aim to eat fish at least 2 times each week. Check food labels carefully to identify foods with trans fats or high amounts of saturated fat. Limit saturated fats. These are found in animal products, such as meats, butter, and cream. Plant sources of saturated fats  include palm oil, palm kernel oil, and coconut oil. Avoid foods with partially hydrogenated oils in them. These contain trans fats. Examples are stick margarine, some tub margarines, cookies, crackers, and other baked goods. Avoid fried foods. General information Eat more home-cooked food and less restaurant, buffet, and fast food. Limit or avoid alcohol. Limit foods that are high in added sugar and simple starches such as foods made using white refined flour (white breads, pastries, sweets). Lose weight if you are overweight. Losing just 5-10% of your body weight can help your overall health and prevent diseases such as diabetes and heart disease. Monitor your sodium intake, especially if you have high blood pressure. Talk with your health care provider about your sodium intake. Try to incorporate more vegetarian meals weekly. What foods should I eat? Fruits All fresh, canned (in natural juice), or frozen fruits. Vegetables Fresh or frozen vegetables (raw, steamed, roasted, or grilled). Green salads. Grains Most grains. Choose whole wheat and whole grains most of the time. Rice and pasta, including brown rice and pastas made with whole wheat. Meats and other proteins Lean, well-trimmed beef, veal, pork, and lamb. Chicken and [place] without skin. All fish and shellfish. Wild duck, rabbit, pheasant, and venison. Egg whites or low-cholesterol egg substitutes. Dried beans, peas, lentils, and tofu. Seeds and most nuts. Dairy Low-fat or nonfat cheeses, including ricotta and mozzarella. Skim or 1% milk (liquid, powdered, or evaporated). Buttermilk made with low-fat milk. Nonfat or low-fat yogurt. Fats and oils Non-hydrogenated (trans-free) margarines. Vegetable oils, including soybean, sesame, sunflower, olive, avocado, peanut, safflower, corn, canola, and cottonseed. Salad dressings or mayonnaise made with a vegetable oil. Beverages Water (mineral or sparkling). Coffee and tea. Unsweetened ice  tea. Diet beverages. Sweets and desserts Sherbet, gelatin, and fruit ice. Small amounts of dark chocolate. Limit all sweets and desserts. Seasonings and condiments All seasonings and condiments. The items listed above may not be a complete list of foods and beverages you can eat. Contact a dietitian for more options. What foods should I avoid? Fruits Canned fruit in heavy syrup. Fruit in cream or butter sauce. Fried fruit. Limit coconut. Vegetables Vegetables cooked in cheese, cream, or butter sauce. Fried vegetables. Grains Breads made with saturated or trans fats, oils, or whole milk. Croissants. Sweet rolls. Donuts. High-fat crackers, such as cheese crackers and chips. Meats and other proteins Fatty meats, such as hot dogs, ribs, sausage, bacon, rib-eye roast or steak. High-fat deli meats, such as salami and bologna. Caviar. Domestic duck and goose. Organ meats, such as liver. Dairy Cream, sour cream, cream cheese, and creamed cottage cheese. Whole-milk cheeses. Whole or 2% milk (liquid, evaporated, or condensed). Whole buttermilk. Cream sauce or high-fat cheese sauce. Whole-milk yogurt. Fats and oils Meat fat, or shortening. Cocoa butter, hydrogenated oils, palm oil, coconut oil, palm kernel oil. Solid fats and shortenings, including bacon fat, salt pork, lard, and butter. Nondairy cream substitutes. Salad dressings with cheese or sour cream. Beverages Regular sodas and any drinks with added sugar. Sweets and desserts Frosting. Pudding. Cookies. Cakes. Pies. Milk chocolate or white chocolate. Buttered syrups. Full-fat ice cream or ice cream drinks. The items listed above may  not be a complete list of foods and beverages to avoid. Contact a dietitian for more information. Summary Heart-healthy meal planning includes limiting unhealthy fats, increasing healthy fats, limiting salt (sodium) intake and making other diet and lifestyle changes. Lose weight if you are overweight. Losing just  5-10% of your body weight can help your overall health and prevent diseases such as diabetes and heart disease. Focus on eating a balance of foods, including fruits and vegetables, low-fat or nonfat dairy, lean protein, nuts and legumes, whole grains, and heart-healthy oils and fats. This information is not intended to replace advice given to you by your health care provider. Make sure you discuss any questions you have with your health care provider. Document Revised: 03/15/2021 Document Reviewed: 03/15/2021 Elsevier Patient Education  2024 [employer].

## 2023-07-10 ENCOUNTER — Encounter: Payer: Self-pay | Admitting: Nurse Practitioner

## 2023-07-10 ENCOUNTER — Ambulatory Visit
Admission: RE | Admit: 2023-07-10 | Discharge: 2023-07-10 | Disposition: A | Discharge status: Home / Self Care | Source: Ambulatory Visit | Attending: Nurse Practitioner | Admitting: Nurse Practitioner

## 2023-07-10 ENCOUNTER — Ambulatory Visit: Payer: Self-pay | Admitting: Nurse Practitioner

## 2023-07-10 VITALS — BP 123/74 | HR 64 | Temp 98.0°F | Wt 161.8 lb

## 2023-07-10 DIAGNOSIS — M79641 Pain in right hand: Secondary | ICD-10-CM | POA: Diagnosis present

## 2023-07-10 DIAGNOSIS — M79642 Pain in left hand: Secondary | ICD-10-CM

## 2023-07-10 DIAGNOSIS — E782 Mixed hyperlipidemia: Secondary | ICD-10-CM

## 2023-07-10 DIAGNOSIS — R748 Abnormal levels of other serum enzymes: Secondary | ICD-10-CM

## 2023-07-10 DIAGNOSIS — K760 Fatty (change of) liver, not elsewhere classified: Secondary | ICD-10-CM

## 2023-07-10 DIAGNOSIS — J432 Centrilobular emphysema: Secondary | ICD-10-CM

## 2023-07-10 DIAGNOSIS — E66811 Obesity, class 1: Secondary | ICD-10-CM

## 2023-07-10 DIAGNOSIS — I10 Essential (primary) hypertension: Secondary | ICD-10-CM | POA: Diagnosis not present

## 2023-07-10 DIAGNOSIS — Z23 Encounter for immunization: Secondary | ICD-10-CM

## 2023-07-10 DIAGNOSIS — Z6829 Body mass index (BMI) 29.0-29.9, adult: Secondary | ICD-10-CM

## 2023-07-10 DIAGNOSIS — R7309 Other abnormal glucose: Secondary | ICD-10-CM

## 2023-07-10 DIAGNOSIS — Z87891 Personal history of nicotine dependence: Secondary | ICD-10-CM

## 2023-07-10 DIAGNOSIS — M19041 Primary osteoarthritis, right hand: Secondary | ICD-10-CM | POA: Insufficient documentation

## 2023-07-10 DIAGNOSIS — I700 Atherosclerosis of aorta: Secondary | ICD-10-CM

## 2023-07-10 DIAGNOSIS — E559 Vitamin D deficiency, unspecified: Secondary | ICD-10-CM

## 2023-07-10 DIAGNOSIS — E6609 Other obesity due to excess calories: Secondary | ICD-10-CM

## 2023-07-10 LAB — MICROALBUMIN, URINE WAIVED
Creatinine, Urine Waived: 100 mg/dL (ref 10–300)
Microalb, Ur Waived: 30 mg/L — ABNORMAL HIGH (ref 0–19)
Microalb/Creat Ratio: <30 mg/g (ref <30)

## 2023-07-10 LAB — BAYER DCA HB A1C WAIVED: HB A1C (BAYER DCA - WAIVED): 5.9 % — ABNORMAL HIGH (ref 4.8–5.6)

## 2023-07-10 NOTE — Assessment & Plan Note (Signed)
 Ongoing, stable. Noted on CT lung screening.  Recommend she continue Crestor  daily and if worsening pain in hands can try taking once a week.  Consider adding ASA in future.

## 2023-07-10 NOTE — Assessment & Plan Note (Signed)
 Ongoing.  A1c 5.9% today.  Recommend heavy focus on diet and exercise changes.  Discussed at length with patient. Urine ALB 21 Jul 2023.

## 2023-07-10 NOTE — Assessment & Plan Note (Addendum)
 Noted on labs May 2021 and April 2022 with calculated R Factor 3.0 (mixed) and fatty liver noted on  imaging + questionable cirrhosis on past CT, recent was stable.  Recommend continued cessation of alcohol use.  Recheck CMP.  Recommend minimal Tylenol  use at home.

## 2023-07-10 NOTE — Assessment & Plan Note (Signed)
Stopped in 2021, praised for continued cessation.  Recommend to continue cessation and yearly lung screens.

## 2023-07-10 NOTE — Progress Notes (Signed)
 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  So loss of the cushion.  On right hand this is worse then on left.  There is also some osteopenia noted, this is thinning of the bone but not fragile bone (osteoporosis).  Definitely take vitamin D3 2000 units daily.  Try the exercises we discussed and Tylenol  as needed.  Any questions? Keep being amazing!!  Thank you for allowing me to participate in your care.  I appreciate you. Kindest regards, Ijanae Macapagal

## 2023-07-10 NOTE — Assessment & Plan Note (Signed)
 Chronic for years with worsening.  Obtained imaging and noted OA to both hands R>L.  May take occasional Tylenol  or Ibuprofen  as needed.  Provided her with hand PT exercises to perform at home as she prefers not to take medication unless absolutely needed.  May benefit from PT in future.

## 2023-07-10 NOTE — Progress Notes (Signed)
 BP 123/74   Pulse 64   Temp 98 F (36.7 C) (Oral)   Wt 161 lb 12.8 oz (73.4 kg)   LMP  (LMP Unknown)   SpO2 99%   BMI 32.13 kg/m    Subjective:    Patient ID: Deanna Velez, female    DOB: 1960-12-22, 63 y.o.   MRN: [id number]  HPI: Deanna Velez is a 63 y.o. female  Chief Complaint  Patient presents with   COPD   Hyperlipidemia   Hypertension   Hand Pain    Patient states she has been getting bilateral hand pain in both of her hands. States it feels similar to cramping.    HYPERTENSION / HYPERLIPIDEMIA Takes Crestor  10 MG daily.  Quit drinking >2 years as of January.  Satisfied with current treatment? yes Duration of hyperlipidemia: chronic Cholesterol medication side effects: no Cholesterol supplements: none Medication compliance: good compliance Aspirin: no Recent stressors: no Recurrent headaches: no Visual changes: no Palpitations: no Dyspnea: no Chest pain: no Lower extremity edema: no Dizzy/lightheaded: no  The 10-year ASCVD risk score (Arnett DK, et al., 2019) is: 3.4%   Values used to calculate the score:     Age: 29 years     Sex: Female     Is Non-Hispanic African American: No     Diabetic: No     Tobacco smoker: No     Systolic Blood Pressure: 123 mmHg     Is BP treated: No     HDL Cholesterol: 59 mg/dL     Total Cholesterol: 213 mg/dL  COPD Centrilobular and paraseptal emphysema + aortic atherosclerosis noted on CT lung screening.  Last screening on 06/29/23.  Quit smoking about 3-4 years ago. COPD status: stable Satisfied with current treatment?: yes Oxygen use: no Dyspnea frequency: no Cough frequency: none Rescue inhaler frequency: none Limitation of activity: no Productive cough: none Last Spirometry: none Pneumovax: Up to Date Influenza: Up to Date   Impaired Fasting Glucose HbA1C:  Lab Results  Component Value Date   HGBA1C 5.9 (H) 07/10/2023  Duration of elevated blood sugar: years Polydipsia: no Polyuria: no Weight change:  no Visual disturbance: no Glucose Monitoring: no    Accucheck frequency: Not Checking    Fasting glucose:     Post prandial:  Diabetic Education: Not Completed Family history of diabetes: yes   HAND PAIN Did a lot of typing in past for job. Duration: years but is getting worse Involved hand: bilateral Mechanism of injury: unknown Location: diffuse Onset: gradual Severity: 8/10 at worst Quality: aching, cramping, and throbbing Frequency: constant Radiation: no Aggravating factors: peeling vegetables/fruit or anything where uses hands Alleviating factors: nothing Treatments attempted: Ibuprofen  twice a month, which helps Relief with NSAIDs?: moderate Weakness: yes hard to open jars Numbness: no Redness: no Swelling:no Bruising: no Fevers: no      07/10/2023   11:52 AM 07/10/2023    8:31 AM 01/06/2023    9:05 AM 07/04/2022    2:09 PM 01/03/2022    1:57 PM  Depression screen PHQ 2/9  Decreased Interest 0  0 0 0  Down, Depressed, Hopeless 0  0 0 0  PHQ - 2 Score 0  0 0 0  Altered sleeping 0  0 0 0  Tired, decreased energy 0  0 0 0  Change in appetite 0  0 0 0  Feeling bad or failure about yourself  0  0 0 0  Trouble concentrating 0  0 0 0  Moving slowly or  fidgety/restless 0  0 0 0  Suicidal thoughts 0  0 0 0  PHQ-9 Score 0  0 0 0  Difficult doing work/chores Not difficult at all Not difficult at all Not difficult at all Not difficult at all Not difficult at all       07/10/2023   11:52 AM 01/06/2023    9:06 AM 07/04/2022    2:09 PM 01/03/2022    1:57 PM  GAD 7 : Generalized Anxiety Score  Nervous, Anxious, on Edge 0 0 0 0  Control/stop worrying 0 0 0 0  Worry too much - different things 0 0 0 0  Trouble relaxing 0 0 0 0  Restless 0 0 0 0  Easily annoyed or irritable 0 0 0 0  Afraid - awful might happen 0 0 0 0  Total GAD 7 Score 0 0 0 0  Anxiety Difficulty Not difficult at all  Not difficult at all Not difficult at all   Relevant past medical, surgical,  family and social history reviewed and updated as indicated. Interim medical history since our last visit reviewed. Allergies and medications reviewed and updated.  Review of Systems  Constitutional:  Negative for activity change, appetite change, diaphoresis, fatigue and fever.  Respiratory:  Negative for cough, chest tightness, shortness of breath and wheezing.   Cardiovascular:  Negative for chest pain, palpitations and leg swelling.  Gastrointestinal:  Negative for abdominal distention, abdominal pain, blood in stool, constipation, diarrhea, nausea, rectal pain and vomiting.  Endocrine: Negative.   Musculoskeletal:  Positive for arthralgias.  Neurological: Negative.   Psychiatric/Behavioral: Negative.     Per HPI unless specifically indicated above     Objective:     BP 123/74   Pulse 64   Temp 98 F (36.7 C) (Oral)   Wt 161 lb 12.8 oz (73.4 kg)   LMP  (LMP Unknown)   SpO2 99%   BMI 32.13 kg/m   Wt Readings from Last 3 Encounters:  07/10/23 161 lb 12.8 oz (73.4 kg)  01/06/23 146 lb 3.2 oz (66.3 kg)  07/04/22 147 lb (66.7 kg)    Physical Exam Vitals and nursing note reviewed.  Constitutional:      General: She is awake. She is not in acute distress.    Appearance: She is well-developed and well-groomed. She is obese. She is not ill-appearing or toxic-appearing.  HENT:     Head: Normocephalic.     Right Ear: Hearing normal.     Left Ear: Hearing normal.  Eyes:     General: Lids are normal.        Right eye: No discharge.        Left eye: No discharge.     Conjunctiva/sclera: Conjunctivae normal.     Pupils: Pupils are equal, round, and reactive to light.  Neck:     Thyroid : No thyromegaly.     Vascular: No carotid bruit.  Cardiovascular:     Rate and Rhythm: Normal rate and regular rhythm.     Heart sounds: Normal heart sounds. No murmur heard.    No gallop.  Pulmonary:     Effort: Pulmonary effort is normal.     Breath sounds: Normal breath sounds.   Abdominal:     General: Bowel sounds are normal. There is no distension.     Palpations: Abdomen is soft. There is no hepatomegaly.     Tenderness: There is no abdominal tenderness.     Hernia: No hernia is present.  Musculoskeletal:  Right hand: No swelling, deformity or tenderness. Normal range of motion. Decreased strength of thumb/finger opposition. Normal sensation. Normal capillary refill. Normal pulse.     Left hand: No swelling, deformity or tenderness. Normal range of motion. Decreased strength of thumb/finger opposition. Normal sensation. Normal capillary refill. Normal pulse.     Cervical back: Normal range of motion and neck supple.     Right lower leg: No edema.     Left lower leg: No edema.  Lymphadenopathy:     Cervical: No cervical adenopathy.  Skin:    General: Skin is warm and dry.  Neurological:     Mental Status: She is alert and oriented to person, place, and time.  Psychiatric:        Attention and Perception: Attention normal.        Mood and Affect: Mood normal.        Speech: Speech normal.        Behavior: Behavior normal. Behavior is cooperative.        Thought Content: Thought content normal.    Results for orders placed or performed in visit on 07/10/23  Bayer DCA Hb A1c Waived   Collection Time: 07/10/23  8:41 AM  Result Value Ref Range   HB A1C (BAYER DCA - WAIVED) 5.9 (H) 4.8 - 5.6 %  Microalbumin, Urine Waived   Collection Time: 07/10/23  8:41 AM  Result Value Ref Range   Microalb, Ur Waived 30 (H) 0 - 19 mg/L   Creatinine, Urine Waived 100 10 - 300 mg/dL   Microalb/Creat Ratio <30 <30 mg/g      Assessment & Plan:   Problem List Items Addressed This Visit       Cardiovascular and Mediastinum   Hypertension (Chronic)   Chronic, stable.  Remains stable without medication since stopped smoking and alcohol use.  Praised for this.  Continue diet focus at home.  Recommend she monitor BP at least a few mornings a week at home and document.   DASH diet at home.  Labs today: CMP, CBC.  Urine ALB 21 Jul 2023.  Return in 6 months.       Relevant Orders   Microalbumin, Urine Waived (Completed)   CBC with Differential/Platelet   Comprehensive metabolic panel with GFR   Aortic atherosclerosis (HCC) (Chronic)   Ongoing, stable. Noted on CT lung screening.  Recommend she continue Crestor  daily and if worsening pain in hands can try taking once a week.  Consider adding ASA in future.      Relevant Orders   Comprehensive metabolic panel with GFR   Lipid Panel w/o Chol/HDL Ratio     Respiratory   Centrilobular emphysema (HCC) - Primary (Chronic)   Chronic, ongoing.  Quit smoking > 2 years ago, praised for continued cessation.  At this time no inhaler regimen and overall minimal symptoms.  Will plan on spirometry next visit and recommend to continue yearly lung screening until age 84.  Return in 6 months.      Relevant Orders   CBC with Differential/Platelet     Other   Mixed hyperlipidemia (Chronic)   Chronic, ongoing.  Recommend she continue Rosuvastatin  10 MG daily and if worsening joint pain may change to weekly dosing. The 10-year ASCVD risk score (Arnett DK, et al., 2019) is: 3.4%   Values used to calculate the score:     Age: 77 years     Sex: Female     Is Non-Hispanic African American: No  Diabetic: No     Tobacco smoker: No     Systolic Blood Pressure: 123 mmHg     Is BP treated: No     HDL Cholesterol: 59 mg/dL     Total Cholesterol: 213 mg/dL       Relevant Orders   Comprehensive metabolic panel with GFR   Lipid Panel w/o Chol/HDL Ratio   Former smoker, stopped smoking many years ago (Chronic)   Stopped in 2021, praised for continued cessation.  Recommend to continue cessation and yearly lung screens.      Elevated hemoglobin A1c measurement (Chronic)   Ongoing.  A1c 5.9% today.  Recommend heavy focus on diet and exercise changes.  Discussed at length with patient. Urine ALB 21 Jul 2023.       Relevant Orders   Bayer DCA Hb A1c Waived (Completed)   Microalbumin, Urine Waived (Completed)   Vitamin D  deficiency   Chronic, ongoing.  Continue supplement at home and recheck level today.      Relevant Orders   Vitamin D  (25 hydroxy)   Obesity   BMI 32.13.  Recommended eating smaller high protein, low fat meals more frequently and exercising 30 mins a day 5 times a week with a goal of 10-15lb weight loss in the next 3 months. Patient voiced their understanding and motivation to adhere to these recommendations.       Elevated liver enzymes   Noted on labs May 2021 and April 2022 with calculated R Factor 3.0 (mixed) and fatty liver noted on  imaging + questionable cirrhosis on past CT, recent was stable.  Recommend continued cessation of alcohol use.  Recheck CMP.  Recommend minimal Tylenol  use at home.      Relevant Orders   Comprehensive metabolic panel with GFR   Bilateral hand pain   Chronic for years with worsening.  Obtained imaging and noted OA to both hands R>L.  May take occasional Tylenol  or Ibuprofen  as needed.  Provided her with hand PT exercises to perform at home as she prefers not to take medication unless absolutely needed.  May benefit from PT in future.      Relevant Orders   DG Hand Complete Left (Completed)   DG Hand Complete Right (Completed)     Follow up plan: Return in about 6 weeks (around 08/21/2023) for Hand Pain.

## 2023-07-10 NOTE — Assessment & Plan Note (Signed)
 Chronic, ongoing.  Quit smoking > 2 years ago, praised for continued cessation.  At this time no inhaler regimen and overall minimal symptoms.  Will plan on spirometry next visit and recommend to continue yearly lung screening until age 63.  Return in 6 months.

## 2023-07-10 NOTE — Assessment & Plan Note (Signed)
 BMI 32.13.  Recommended eating smaller high protein, low fat meals more frequently and exercising 30 mins a day 5 times a week with a goal of 10-15lb weight loss in the next 3 months. Patient voiced their understanding and motivation to adhere to these recommendations.

## 2023-07-10 NOTE — Assessment & Plan Note (Signed)
 Chronic, stable.  Remains stable without medication since stopped smoking and alcohol use.  Praised for this.  Continue diet focus at home.  Recommend she monitor BP at least a few mornings a week at home and document.  DASH diet at home.  Labs today: CMP, CBC.  Urine ALB 21 Jul 2023.  Return in 6 months.

## 2023-07-10 NOTE — Assessment & Plan Note (Signed)
 Chronic, ongoing.  Recommend she continue Rosuvastatin  10 MG daily and if worsening joint pain may change to weekly dosing. The 10-year ASCVD risk score (Arnett DK, et al., 2019) is: 3.4%   Values used to calculate the score:     Age: 63 years     Sex: Female     Is Non-Hispanic African American: No     Diabetic: No     Tobacco smoker: No     Systolic Blood Pressure: 123 mmHg     Is BP treated: No     HDL Cholesterol: 59 mg/dL     Total Cholesterol: 213 mg/dL

## 2023-07-10 NOTE — Assessment & Plan Note (Signed)
Chronic, ongoing.  Continue supplement at home and recheck level today. 

## 2023-07-11 LAB — CBC WITH DIFFERENTIAL/PLATELET
Basophils Absolute: 0 10*3/uL (ref 0.0–0.2)
Basos: 1 %
EOS (ABSOLUTE): 0.2 10*3/uL (ref 0.0–0.4)
Eos: 2 %
Hematocrit: 39.6 % (ref 34.0–46.6)
Hemoglobin: 12.2 g/dL (ref 11.1–15.9)
Immature Grans (Abs): 0 10*3/uL (ref 0.0–0.1)
Immature Granulocytes: 0 %
Lymphocytes Absolute: 1.8 10*3/uL (ref 0.7–3.1)
Lymphs: 27 %
MCH: 27.4 pg (ref 26.6–33.0)
MCHC: 30.8 g/dL — ABNORMAL LOW (ref 31.5–35.7)
MCV: 89 fL (ref 79–97)
Monocytes Absolute: 0.4 10*3/uL (ref 0.1–0.9)
Monocytes: 7 %
Neutrophils Absolute: 4.3 10*3/uL (ref 1.4–7.0)
Neutrophils: 63 %
Platelets: 258 10*3/uL (ref 150–450)
RBC: 4.45 x10E6/uL (ref 3.77–5.28)
RDW: 13 % (ref 11.7–15.4)
WBC: 6.8 10*3/uL (ref 3.4–10.8)

## 2023-07-11 LAB — LIPID PANEL W/O CHOL/HDL RATIO
Cholesterol, Total: 125 mg/dL (ref 100–199)
HDL: 47 mg/dL (ref >39)
LDL Chol Calc (NIH): 59 mg/dL (ref 0–99)
Triglycerides: 105 mg/dL (ref 0–149)
VLDL Cholesterol Cal: 19 mg/dL (ref 5–40)

## 2023-07-11 LAB — COMPREHENSIVE METABOLIC PANEL WITH GFR
ALT: 19 IU/L (ref 0–32)
AST: 23 IU/L (ref 0–40)
Albumin: 4.7 g/dL (ref 3.9–4.9)
Alkaline Phosphatase: 100 IU/L (ref 44–121)
BUN/Creatinine Ratio: 9 — ABNORMAL LOW (ref 12–28)
BUN: 8 mg/dL (ref 8–27)
Bilirubin Total: 0.6 mg/dL (ref 0.0–1.2)
CO2: 21 mmol/L (ref 20–29)
Calcium: 10.1 mg/dL (ref 8.7–10.3)
Chloride: 101 mmol/L (ref 96–106)
Creatinine, Ser: 0.86 mg/dL (ref 0.57–1.00)
Globulin, Total: 2.8 g/dL (ref 1.5–4.5)
Glucose: 103 mg/dL — ABNORMAL HIGH (ref 70–99)
Potassium: 4.7 mmol/L (ref 3.5–5.2)
Sodium: 140 mmol/L (ref 134–144)
Total Protein: 7.5 g/dL (ref 6.0–8.5)
eGFR: 77 mL/min/{1.73_m2} (ref >59)

## 2023-07-11 LAB — VITAMIN D 25 HYDROXY (VIT D DEFICIENCY, FRACTURES): Vit D, 25-Hydroxy: 38.2 ng/mL (ref 30.0–100.0)

## 2023-07-11 NOTE — Progress Notes (Signed)
 Contacted via MyChart   Good morning Deanna Velez, your labs have returned and overall look great.   - Kidney function, creatinine and eGFR, remains normal, as is liver function, AST and ALT.  - With Rosuvastatin  on board your LDL, bad cholesterol, has improved a lot from 134 to 59 and total cholesterol from 213 to 125.  Great news!! Continue this medication. Overall no changes needed.  Any questions? Keep being stellar!!  Thank you for allowing me to participate in your care.  I appreciate you. Kindest regards, Anab Vivar

## 2023-07-24 ENCOUNTER — Other Ambulatory Visit: Payer: Self-pay | Admitting: Acute Care

## 2023-07-24 DIAGNOSIS — Z122 Encounter for screening for malignant neoplasm of respiratory organs: Secondary | ICD-10-CM

## 2023-07-24 DIAGNOSIS — Z87891 Personal history of nicotine dependence: Secondary | ICD-10-CM

## 2023-08-19 NOTE — Patient Instructions (Signed)
Be Involved in Caring For Your Health:  Taking Medications When medications are taken as directed, they can greatly improve your health. But if they are not taken as prescribed, they may not work. In some cases, not taking them correctly can be harmful. To help ensure your treatment remains effective and safe, understand your medications and how to take them. Bring your medications to each visit for review by your provider.  Your lab results, notes, and after visit summary will be available on My Chart. We strongly encourage you to use this feature. If lab results are abnormal the clinic will contact you with the appropriate steps. If the clinic does not contact you assume the results are satisfactory. You can always view your results on My Chart. If you have questions regarding your health or results, please contact the clinic during office hours. You can also ask questions on My Chart.  We at [facility] are grateful that you chose [place] to provide your care. We strive to provide evidence-based and compassionate care and are always looking for feedback. If you get a survey from the clinic please complete this so we can hear your opinions.  Hand Pain Hand pain can make it hard to do daily activities. Many things can cause hand pain. Some common causes are: Injuries. These may include: Broken bones (fractures)and cuts. Overuse injuries from doing the same movements many times (repetitive activity). Arthritis. Lumps in the tendons or joints of the hand and wrist (ganglion cysts). Nerve compression syndromes (carpal tunnel syndrome). Inflammation of the tendons (tendinitis). Infection. Follow these instructions at home: Managing pain, stiffness, and swelling     Take over-the-counter and prescription medicines only as told by your health care provider. If told, put ice on the affected area. Put ice in a plastic bag. Place a towel between your skin and the bag. Leave the ice on for  20 minutes, 2-3 times a day. If told, apply heat to the affected area before you exercise or as often as told by your provider. Use the heat source that your provider recommends, such as a moist heat pack or a heating pad. Place a towel between your skin and the heat source. Leave the heat on for 20-30 minutes. If your skin turns bright red, remove the ice or heat right away to prevent skin damage. The risk of damage is higher if you cannot feel pain, heat, or cold. Activity Take breaks from repetitive activity often. Minimize stress on your hands and wrists as much as possible. Do stretches or exercises as told by your provider. Do not do activities that make your pain worse. Wear a hand splint or support as told by your provider. Contact a health care provider if: Your pain does not get better after a few days. Your pain gets worse. Your pain affects your ability to do your daily activities. Your hand becomes warm, red, or swollen. Your hand is numb or tingling. Get help right away if: Your hand is extremely swollen or is an unusual shape. Your hand or fingers turn white or blue. You cannot move your hand, wrist, or fingers. This information is not intended to replace advice given to you by your health care provider. Make sure you discuss any questions you have with your health care provider. Document Revised: 09/15/2021 Document Reviewed: 09/15/2021 Elsevier Patient Education  2024 [employer].

## 2023-08-23 ENCOUNTER — Encounter: Payer: Self-pay | Admitting: Nurse Practitioner

## 2023-08-23 ENCOUNTER — Ambulatory Visit: Admitting: Nurse Practitioner

## 2023-08-23 VITALS — BP 138/78 | HR 62 | Temp 98.5°F | Ht 59.5 in | Wt 162.4 lb

## 2023-08-23 DIAGNOSIS — M19041 Primary osteoarthritis, right hand: Secondary | ICD-10-CM

## 2023-08-23 DIAGNOSIS — M19042 Primary osteoarthritis, left hand: Secondary | ICD-10-CM | POA: Diagnosis not present

## 2023-08-23 NOTE — Assessment & Plan Note (Signed)
 Ongoing, noted on imaging 07/10/23 with R>L. Educated her on findings today and reviewed imaging.  Recommend continue home PT exercises and will consider outpatient PT if any worsening pain. May take occasional Tylenol  or Ibuprofen  as needed.

## 2023-08-23 NOTE — Progress Notes (Signed)
 BP 138/78   Pulse 62   Temp 98.5 F (36.9 C) (Oral)   Ht 4' 11.5 (1.511 m)   Wt 162 lb 6.4 oz (73.7 kg)   LMP  (LMP Unknown)   SpO2 95%   BMI 32.25 kg/m    Subjective:    Patient ID: Deanna Velez, female    DOB: Oct 30, 1960, 63 y.o.   MRN: [id number]  HPI: Deanna Velez is a 63 y.o. female  Chief Complaint  Patient presents with   Hand Pain   Results   HAND PAIN Did a lot of typing in past for job. Imaging noted OA of both hands with R>L. Is right hand dominant. Has been working on hand PT that PCP printed for her last visit, these have helped a little bit. Duration: years but is getting worse Involved hand: bilateral Mechanism of injury: unknown Location: diffuse Onset: gradual Severity: 7/10 at worst Quality: aching, cramping, and throbbing Frequency: intermittent Radiation: no Aggravating factors: peeling vegetables/fruit or when using hands for tasks Alleviating factors: nothing Treatments attempted: Ibuprofen  twice a month, which helps Relief with NSAIDs?: moderate Weakness: yes with opening jars Numbness: no Redness: no Swelling:no Bruising: no Fevers: no      07/10/2023   11:52 AM 07/10/2023    8:31 AM 01/06/2023    9:05 AM 07/04/2022    2:09 PM 01/03/2022    1:57 PM  Depression screen PHQ 2/9  Decreased Interest 0  0 0 0  Down, Depressed, Hopeless 0  0 0 0  PHQ - 2 Score 0  0 0 0  Altered sleeping 0  0 0 0  Tired, decreased energy 0  0 0 0  Change in appetite 0  0 0 0  Feeling bad or failure about yourself  0  0 0 0  Trouble concentrating 0  0 0 0  Moving slowly or fidgety/restless 0  0 0 0  Suicidal thoughts 0  0 0 0  PHQ-9 Score 0  0 0 0  Difficult doing work/chores Not difficult at all Not difficult at all Not difficult at all Not difficult at all Not difficult at all       07/10/2023   11:52 AM 01/06/2023    9:06 AM 07/04/2022    2:09 PM 01/03/2022    1:57 PM  GAD 7 : Generalized Anxiety Score  Nervous, Anxious, on Edge 0 0 0 0   Control/stop worrying 0 0 0 0  Worry too much - different things 0 0 0 0  Trouble relaxing 0 0 0 0  Restless 0 0 0 0  Easily annoyed or irritable 0 0 0 0  Afraid - awful might happen 0 0 0 0  Total GAD 7 Score 0 0 0 0  Anxiety Difficulty Not difficult at all  Not difficult at all Not difficult at all   Relevant past medical, surgical, family and social history reviewed and updated as indicated. Interim medical history since our last visit reviewed. Allergies and medications reviewed and updated.  Review of Systems  Constitutional:  Negative for activity change, appetite change, diaphoresis, fatigue and fever.  Respiratory:  Negative for cough, chest tightness, shortness of breath and wheezing.   Cardiovascular:  Negative for chest pain, palpitations and leg swelling.  Gastrointestinal:  Negative for abdominal distention, abdominal pain, blood in stool, constipation, diarrhea, nausea, rectal pain and vomiting.  Endocrine: Negative.   Musculoskeletal:  Positive for arthralgias.  Neurological: Negative.   Psychiatric/Behavioral: Negative.  Per HPI unless specifically indicated above     Objective:     BP 138/78   Pulse 62   Temp 98.5 F (36.9 C) (Oral)   Ht 4' 11.5 (1.511 m)   Wt 162 lb 6.4 oz (73.7 kg)   LMP  (LMP Unknown)   SpO2 95%   BMI 32.25 kg/m   Wt Readings from Last 3 Encounters:  08/23/23 162 lb 6.4 oz (73.7 kg)  07/10/23 161 lb 12.8 oz (73.4 kg)  01/06/23 146 lb 3.2 oz (66.3 kg)    Physical Exam Vitals and nursing note reviewed.  Constitutional:      General: She is awake. She is not in acute distress.    Appearance: She is well-developed and well-groomed. She is obese. She is not ill-appearing or toxic-appearing.  HENT:     Head: Normocephalic.     Right Ear: Hearing normal.     Left Ear: Hearing normal.  Eyes:     General: Lids are normal.        Right eye: No discharge.        Left eye: No discharge.     Conjunctiva/sclera: Conjunctivae normal.      Pupils: Pupils are equal, round, and reactive to light.  Neck:     Thyroid : No thyromegaly.     Vascular: No carotid bruit.  Cardiovascular:     Rate and Rhythm: Normal rate and regular rhythm.     Heart sounds: Normal heart sounds. No murmur heard.    No gallop.  Pulmonary:     Effort: Pulmonary effort is normal.     Breath sounds: Normal breath sounds.  Abdominal:     General: Bowel sounds are normal. There is no distension.     Palpations: Abdomen is soft. There is no hepatomegaly.     Tenderness: There is no abdominal tenderness.     Hernia: No hernia is present.  Musculoskeletal:     Right hand: No swelling, deformity or tenderness. Normal range of motion. Decreased strength of thumb/finger opposition. Normal sensation. Normal capillary refill. Normal pulse.     Left hand: No swelling, deformity or tenderness. Normal range of motion. Decreased strength of thumb/finger opposition. Normal sensation. Normal capillary refill. Normal pulse.     Cervical back: Normal range of motion and neck supple.     Right lower leg: No edema.     Left lower leg: No edema.  Lymphadenopathy:     Cervical: No cervical adenopathy.  Skin:    General: Skin is warm and dry.  Neurological:     Mental Status: She is alert and oriented to person, place, and time.  Psychiatric:        Attention and Perception: Attention normal.        Mood and Affect: Mood normal.        Speech: Speech normal.        Behavior: Behavior normal. Behavior is cooperative.        Thought Content: Thought content normal.    Results for orders placed or performed in visit on 07/10/23  Bayer DCA Hb A1c Waived   Collection Time: 07/10/23  8:41 AM  Result Value Ref Range   HB A1C (BAYER DCA - WAIVED) 5.9 (H) 4.8 - 5.6 %  Microalbumin, Urine Waived   Collection Time: 07/10/23  8:41 AM  Result Value Ref Range   Microalb, Ur Waived 30 (H) 0 - 19 mg/L   Creatinine, Urine Waived 100 10 - 300 mg/dL  Microalb/Creat Ratio  <30 <30 mg/g  CBC with Differential/Platelet   Collection Time: 07/10/23  8:43 AM  Result Value Ref Range   WBC 6.8 3.4 - 10.8 x10E3/uL   RBC 4.45 3.77 - 5.28 x10E6/uL   Hemoglobin 12.2 11.1 - 15.9 g/dL   Hematocrit 60.3 65.9 - 46.6 %   MCV 89 79 - 97 fL   MCH 27.4 26.6 - 33.0 pg   MCHC 30.8 (L) 31.5 - 35.7 g/dL   RDW 86.9 88.2 - 84.5 %   Platelets 258 150 - 450 x10E3/uL   Neutrophils 63 Not Estab. %   Lymphs 27 Not Estab. %   Monocytes 7 Not Estab. %   Eos 2 Not Estab. %   Basos 1 Not Estab. %   Neutrophils Absolute 4.3 1.4 - 7.0 x10E3/uL   Lymphocytes Absolute 1.8 0.7 - 3.1 x10E3/uL   Monocytes Absolute 0.4 0.1 - 0.9 x10E3/uL   EOS (ABSOLUTE) 0.2 0.0 - 0.4 x10E3/uL   Basophils Absolute 0.0 0.0 - 0.2 x10E3/uL   Immature Granulocytes 0 Not Estab. %   Immature Grans (Abs) 0.0 0.0 - 0.1 x10E3/uL  Comprehensive metabolic panel with GFR   Collection Time: 07/10/23  8:43 AM  Result Value Ref Range   Glucose 103 (H) 70 - 99 mg/dL   BUN 8 8 - 27 mg/dL   Creatinine, Ser 9.13 0.57 - 1.00 mg/dL   eGFR 77 >40 fO/fpw/8.26   BUN/Creatinine Ratio 9 (L) 12 - 28   Sodium 140 134 - 144 mmol/L   Potassium 4.7 3.5 - 5.2 mmol/L   Chloride 101 96 - 106 mmol/L   CO2 21 20 - 29 mmol/L   Calcium  10.1 8.7 - 10.3 mg/dL   Total Protein 7.5 6.0 - 8.5 g/dL   Albumin 4.7 3.9 - 4.9 g/dL   Globulin, Total 2.8 1.5 - 4.5 g/dL   Bilirubin Total 0.6 0.0 - 1.2 mg/dL   Alkaline Phosphatase 100 44 - 121 IU/L   AST 23 0 - 40 IU/L   ALT 19 0 - 32 IU/L  Lipid Panel w/o Chol/HDL Ratio   Collection Time: 07/10/23  8:43 AM  Result Value Ref Range   Cholesterol, Total 125 100 - 199 mg/dL   Triglycerides 894 0 - 149 mg/dL   HDL 47 >60 mg/dL   VLDL Cholesterol Cal 19 5 - 40 mg/dL   LDL Chol Calc (NIH) 59 0 - 99 mg/dL  Vitamin D  (25 hydroxy)   Collection Time: 07/10/23  8:43 AM  Result Value Ref Range   Vit D, 25-Hydroxy 38.2 30.0 - 100.0 ng/mL      Assessment & Plan:   Problem List Items Addressed This  Visit       Musculoskeletal and Integument   Primary osteoarthritis of both hands - Primary   Ongoing, noted on imaging 07/10/23 with R>L. Educated her on findings today and reviewed imaging.  Recommend continue home PT exercises and will consider outpatient PT if any worsening pain. May take occasional Tylenol  or Ibuprofen  as needed.           Follow up plan: Return for as schedule in November for physical.

## 2024-01-05 ENCOUNTER — Other Ambulatory Visit: Payer: Self-pay | Admitting: Nurse Practitioner

## 2024-01-06 NOTE — Patient Instructions (Signed)
 Be Involved in Caring For Your Health:  Taking Medications When medications are taken as directed, they can greatly improve your health. But if they are not taken as prescribed, they may not work. In some cases, not taking them correctly can be harmful. To help ensure your treatment remains effective and safe, understand your medications and how to take them. Bring your medications to each visit for review by your provider.  Your lab results, notes, and after visit summary will be available on My Chart. We strongly encourage you to use this feature. If lab results are abnormal the clinic will contact you with the appropriate steps. If the clinic does not contact you assume the results are satisfactory. You can always view your results on My Chart. If you have questions regarding your health or results, please contact the clinic during office hours. You can also ask questions on My Chart.  We at [facility] are grateful that you chose [place] to provide your care. We strive to provide evidence-based and compassionate care and are always looking for feedback. If you get a survey from the clinic please complete this so we can hear your opinions.  Heart-Healthy Eating Plan Many factors influence your heart health, including eating and exercise habits. Heart health is also called coronary health. Coronary risk increases with abnormal blood fat (lipid) levels. A heart-healthy eating plan includes limiting unhealthy fats, increasing healthy fats, limiting salt (sodium) intake, and making other diet and lifestyle changes. What is my plan? Your health care provider may recommend that: You limit your fat intake to _________% or less of your total calories each day. You limit your saturated fat intake to _________% or less of your total calories each day. You limit the amount of cholesterol in your diet to less than _________ mg per day. You limit the amount of sodium in your diet to less than _________  mg per day. What are tips for following this plan? Cooking Cook foods using methods other than frying. Baking, boiling, grilling, and broiling are all good options. Other ways to reduce fat include: Removing the skin from poultry. Removing all visible fats from meats. Steaming vegetables in water or broth. Meal planning  At meals, imagine dividing your plate into fourths: Fill one-half of your plate with vegetables and green salads. Fill one-fourth of your plate with whole grains. Fill one-fourth of your plate with lean protein foods. Eat 2-4 cups of vegetables per day. One cup of vegetables equals 1 cup (91 g) broccoli or cauliflower florets, 2 medium carrots, 1 large bell pepper, 1 large sweet potato, 1 large tomato, 1 medium white potato, 2 cups (150 g) raw leafy greens. Eat 1-2 cups of fruit per day. One cup of fruit equals 1 small apple, 1 large banana, 1 cup (237 g) mixed fruit, 1 large orange,  cup (82 g) dried fruit, 1 cup (240 mL) 100% fruit juice. Eat more foods that contain soluble fiber. Examples include apples, broccoli, carrots, beans, peas, and barley. Aim to get 25-30 g of fiber per day. Increase your consumption of legumes, nuts, and seeds to 4-5 servings per week. One serving of dried beans or legumes equals  cup (90 g) cooked, 1 serving of nuts is  oz (12 almonds, 24 pistachios, or 7 walnut halves), and 1 serving of seeds equals  oz (8 g). Fats Choose healthy fats more often. Choose monounsaturated and polyunsaturated fats, such as olive and canola oils, avocado oil, flaxseeds, walnuts, almonds, and seeds. Eat  more omega-3 fats. Choose salmon, mackerel, sardines, tuna, flaxseed oil, and ground flaxseeds. Aim to eat fish at least 2 times each week. Check food labels carefully to identify foods with trans fats or high amounts of saturated fat. Limit saturated fats. These are found in animal products, such as meats, butter, and cream. Plant sources of saturated fats  include palm oil, palm kernel oil, and coconut oil. Avoid foods with partially hydrogenated oils in them. These contain trans fats. Examples are stick margarine, some tub margarines, cookies, crackers, and other baked goods. Avoid fried foods. General information Eat more home-cooked food and less restaurant, buffet, and fast food. Limit or avoid alcohol. Limit foods that are high in added sugar and simple starches such as foods made using white refined flour (white breads, pastries, sweets). Lose weight if you are overweight. Losing just 5-10% of your body weight can help your overall health and prevent diseases such as diabetes and heart disease. Monitor your sodium intake, especially if you have high blood pressure. Talk with your health care provider about your sodium intake. Try to incorporate more vegetarian meals weekly. What foods should I eat? Fruits All fresh, canned (in natural juice), or frozen fruits. Vegetables Fresh or frozen vegetables (raw, steamed, roasted, or grilled). Green salads. Grains Most grains. Choose whole wheat and whole grains most of the time. Rice and pasta, including brown rice and pastas made with whole wheat. Meats and other proteins Lean, well-trimmed beef, veal, pork, and lamb. Chicken and [place] without skin. All fish and shellfish. Wild duck, rabbit, pheasant, and venison. Egg whites or low-cholesterol egg substitutes. Dried beans, peas, lentils, and tofu. Seeds and most nuts. Dairy Low-fat or nonfat cheeses, including ricotta and mozzarella. Skim or 1% milk (liquid, powdered, or evaporated). Buttermilk made with low-fat milk. Nonfat or low-fat yogurt. Fats and oils Non-hydrogenated (trans-free) margarines. Vegetable oils, including soybean, sesame, sunflower, olive, avocado, peanut, safflower, corn, canola, and cottonseed. Salad dressings or mayonnaise made with a vegetable oil. Beverages Water (mineral or sparkling). Coffee and tea. Unsweetened ice  tea. Diet beverages. Sweets and desserts Sherbet, gelatin, and fruit ice. Small amounts of dark chocolate. Limit all sweets and desserts. Seasonings and condiments All seasonings and condiments. The items listed above may not be a complete list of foods and beverages you can eat. Contact a dietitian for more options. What foods should I avoid? Fruits Canned fruit in heavy syrup. Fruit in cream or butter sauce. Fried fruit. Limit coconut. Vegetables Vegetables cooked in cheese, cream, or butter sauce. Fried vegetables. Grains Breads made with saturated or trans fats, oils, or whole milk. Croissants. Sweet rolls. Donuts. High-fat crackers, such as cheese crackers and chips. Meats and other proteins Fatty meats, such as hot dogs, ribs, sausage, bacon, rib-eye roast or steak. High-fat deli meats, such as salami and bologna. Caviar. Domestic duck and goose. Organ meats, such as liver. Dairy Cream, sour cream, cream cheese, and creamed cottage cheese. Whole-milk cheeses. Whole or 2% milk (liquid, evaporated, or condensed). Whole buttermilk. Cream sauce or high-fat cheese sauce. Whole-milk yogurt. Fats and oils Meat fat, or shortening. Cocoa butter, hydrogenated oils, palm oil, coconut oil, palm kernel oil. Solid fats and shortenings, including bacon fat, salt pork, lard, and butter. Nondairy cream substitutes. Salad dressings with cheese or sour cream. Beverages Regular sodas and any drinks with added sugar. Sweets and desserts Frosting. Pudding. Cookies. Cakes. Pies. Milk chocolate or white chocolate. Buttered syrups. Full-fat ice cream or ice cream drinks. The items listed above may  not be a complete list of foods and beverages to avoid. Contact a dietitian for more information. Summary Heart-healthy meal planning includes limiting unhealthy fats, increasing healthy fats, limiting salt (sodium) intake and making other diet and lifestyle changes. Lose weight if you are overweight. Losing just  5-10% of your body weight can help your overall health and prevent diseases such as diabetes and heart disease. Focus on eating a balance of foods, including fruits and vegetables, low-fat or nonfat dairy, lean protein, nuts and legumes, whole grains, and heart-healthy oils and fats. This information is not intended to replace advice given to you by your health care provider. Make sure you discuss any questions you have with your health care provider. Document Revised: 03/15/2021 Document Reviewed: 03/15/2021 Elsevier Patient Education  2024 [employer].

## 2024-01-07 NOTE — Telephone Encounter (Signed)
 Requested Prescriptions  Pending Prescriptions Disp Refills   rosuvastatin  (CRESTOR ) 10 MG tablet [Pharmacy Med Name: ROSUVASTATIN  CALCIUM  10 MG TAB] 90 tablet 1    Sig: TAKE 1 TABLET BY MOUTH DAILY     Cardiovascular:  Antilipid - Statins 2 Failed - 01/07/2024 12:27 PM      Failed - Lipid Panel in normal range within the last 12 months    Cholesterol, Total  Date Value Ref Range Status  07/10/2023 125 100 - 199 mg/dL Final   LDL Chol Calc (NIH)  Date Value Ref Range Status  07/10/2023 59 0 - 99 mg/dL Final   HDL  Date Value Ref Range Status  07/10/2023 47 >39 mg/dL Final   Triglycerides  Date Value Ref Range Status  07/10/2023 105 0 - 149 mg/dL Final         Passed - Cr in normal range and within 360 days    Creatinine, Ser  Date Value Ref Range Status  07/10/2023 0.86 0.57 - 1.00 mg/dL Final         Passed - Patient is not pregnant      Passed - Valid encounter within last 12 months    Recent Outpatient Visits           4 months ago Primary osteoarthritis of both hands   [place] [facility] [place], [place] T, NP   6 months ago Centrilobular emphysema (HCC)   [place] [facility] [place], Melanie DASEN, NP       Future Appointments             Tomorrow [place], Jolene T, NP [place] [facility], [address]

## 2024-01-08 ENCOUNTER — Encounter: Payer: Self-pay | Admitting: Nurse Practitioner

## 2024-01-08 ENCOUNTER — Ambulatory Visit (INDEPENDENT_AMBULATORY_CARE_PROVIDER_SITE_OTHER): Payer: Self-pay | Admitting: Nurse Practitioner

## 2024-01-08 VITALS — BP 128/82 | HR 76 | Temp 98.4°F | Resp 16 | Ht 59.49 in | Wt 166.0 lb

## 2024-01-08 DIAGNOSIS — M2141 Flat foot [pes planus] (acquired), right foot: Secondary | ICD-10-CM | POA: Insufficient documentation

## 2024-01-08 DIAGNOSIS — Z1211 Encounter for screening for malignant neoplasm of colon: Secondary | ICD-10-CM

## 2024-01-08 DIAGNOSIS — K760 Fatty (change of) liver, not elsewhere classified: Secondary | ICD-10-CM

## 2024-01-08 DIAGNOSIS — Z23 Encounter for immunization: Secondary | ICD-10-CM

## 2024-01-08 DIAGNOSIS — E782 Mixed hyperlipidemia: Secondary | ICD-10-CM

## 2024-01-08 DIAGNOSIS — I700 Atherosclerosis of aorta: Secondary | ICD-10-CM

## 2024-01-08 DIAGNOSIS — E66811 Other obesity due to excess calories: Secondary | ICD-10-CM

## 2024-01-08 DIAGNOSIS — Z0000 Encounter for general adult medical examination without abnormal findings: Secondary | ICD-10-CM

## 2024-01-08 DIAGNOSIS — J432 Centrilobular emphysema: Secondary | ICD-10-CM

## 2024-01-08 DIAGNOSIS — R7309 Other abnormal glucose: Secondary | ICD-10-CM

## 2024-01-08 DIAGNOSIS — E559 Vitamin D deficiency, unspecified: Secondary | ICD-10-CM

## 2024-01-08 DIAGNOSIS — I10 Essential (primary) hypertension: Secondary | ICD-10-CM

## 2024-01-08 LAB — BAYER DCA HB A1C WAIVED: HB A1C (BAYER DCA - WAIVED): 5.7 % — ABNORMAL HIGH (ref 4.8–5.6)

## 2024-01-08 MED ORDER — ROSUVASTATIN CALCIUM 10 MG PO TABS: 10.0000 mg | ORAL_TABLET | Freq: Every day | ORAL | 4 refills | Status: AC

## 2024-01-08 NOTE — Assessment & Plan Note (Signed)
BMI 32.98.  Recommended eating smaller high protein, low fat meals more frequently and exercising 30 mins a day 5 times a week with a goal of 10-15lb weight loss in the next 3 months. Patient voiced their understanding and motivation to adhere to these recommendations.

## 2024-01-08 NOTE — Assessment & Plan Note (Signed)
Ongoing on u/s noted.  Recommend continued cessation of alcohol intake and focus on health diet at home, reducing processed foods.  Recheck labs today. 

## 2024-01-08 NOTE — Assessment & Plan Note (Signed)
Chronic, ongoing.  Continue supplement at home and recheck level today. 

## 2024-01-08 NOTE — Assessment & Plan Note (Signed)
 Chronic, ongoing.  Quit smoking > 4 years ago, praised for continued cessation.  At this time no inhaler regimen and overall minimal symptoms.  Will plan on spirometry next visit and recommend to continue yearly lung screening until age 63.  Return in 6 months.

## 2024-01-08 NOTE — Assessment & Plan Note (Signed)
Stopped in 2021, praised for continued cessation.  Recommend to continue cessation and yearly lung screens.

## 2024-01-08 NOTE — Assessment & Plan Note (Signed)
 Chronic, stable.  Remains stable without medication since stopped smoking and alcohol use.  Praised for this.  Continue diet focus at home.  Recommend she monitor BP at least a few mornings a week at home and document.  DASH diet at home.  Labs today: CMP, CBC, TSH.  Urine ALB 21 Jul 2023.  Return in 6 months.

## 2024-01-08 NOTE — Progress Notes (Signed)
 BP 128/82 (BP Location: Left Arm, Patient Position: Sitting)   Pulse 76   Temp 98.4 F (36.9 C) (Oral)   Resp 16   Ht 4' 11.49 (1.511 m)   Wt 166 lb (75.3 kg)   LMP  (LMP Unknown)   SpO2 98%   BMI 32.98 kg/m    Subjective:    Patient ID: Deanna Velez, female    DOB: [birth year], 63 y.o.   MRN: [id number]  HPI: Deanna Velez is a 63 y.o. female presenting on 01/08/2024 for comprehensive medical examination. Current medical complaints include: breast issues  She currently lives with: significant other Menopausal Symptoms: no  Has been having occasional, intermittent sharp pains to right foot. Hour episodes, the next day she will touch it and it is sore. Had similar in 2016, appears to have had imaging and been seen by podiatry but she reports podiatry visits were something different. Then in 2019 was seen for similar of left foot but did not get imaging at the time,  HYPERTENSION / HYPERLIPIDEMIA No BP medications at present.  Taking Crestor  for HLD.  Quit drinking 4 years as of January.  Satisfied with current treatment? yes Duration of hyperlipidemia: chronic Cholesterol medication side effects: no Cholesterol supplements: none Medication compliance: good compliance Aspirin: no Recent stressors: no Recurrent headaches: no Visual changes: no Palpitations: no Dyspnea: occasional Chest pain: no Lower extremity edema: no Dizzy/lightheaded: no  The ASCVD Risk score (Arnett DK, et al., 2019) failed to calculate for the following reasons:   The valid total cholesterol range is 130 to 320 mg/dL   COPD Centrilobular and paraseptal emphysema and aortic atherosclerosis noted on CT lung screening, last 06/29/23. Quit smoking about >4 years ago. COPD status: stable Satisfied with current treatment?: yes Oxygen use: no Dyspnea frequency: occasional Cough frequency: none Rescue inhaler frequency: none Limitation of activity: no Productive cough: none Last Spirometry: none Pneumovax:  Up to Date Influenza: Up to Date   Impaired Fasting Glucose Past elevations in A1c. Known fatty liver. Vitamin D  deficiency present, takes supplement. HbA1C:  Lab Results  Component Value Date   HGBA1C 5.9 (H) 07/10/2023  Duration of elevated blood sugar: chronic Polydipsia: no Polyuria: no Weight change: no Visual disturbance: no Glucose Monitoring: no    Accucheck frequency: Not Checking    Fasting glucose:     Post prandial:  Diabetic Education: Not Completed Family history of diabetes: yes   Depression Screen done today and results listed below:     01/08/2024    8:50 AM 07/10/2023   11:52 AM 07/10/2023    8:31 AM 01/06/2023    9:05 AM 07/04/2022    2:09 PM  Depression screen PHQ 2/9  Decreased Interest 0 0  0 0  Down, Depressed, Hopeless 0 0  0 0  PHQ - 2 Score 0 0  0 0  Altered sleeping 0 0  0 0  Tired, decreased energy 0 0  0 0  Change in appetite 0 0  0 0  Feeling bad or failure about yourself  0 0  0 0  Trouble concentrating 0 0  0 0  Moving slowly or fidgety/restless 0 0  0 0  Suicidal thoughts 0 0  0 0  PHQ-9 Score 0 0   0  0   Difficult doing work/chores  Not difficult at all Not difficult at all Not difficult at all Not difficult at all     Data saved with a previous flowsheet row definition  01/08/2024    8:50 AM 07/10/2023   11:52 AM 01/06/2023    9:06 AM 07/04/2022    2:09 PM  GAD 7 : Generalized Anxiety Score  Nervous, Anxious, on Edge 0 0 0 0  Control/stop worrying 0 0 0 0  Worry too much - different things 0 0 0 0  Trouble relaxing 0 0 0 0  Restless 0 0 0 0  Easily annoyed or irritable 0 0 0 0  Afraid - awful might happen 0 0 0 0  Total GAD 7 Score 0 0 0 0  Anxiety Difficulty  Not difficult at all  Not difficult at all      01/03/2022    1:51 PM 01/06/2023    8:46 AM 01/06/2023    9:06 AM 07/10/2023    8:31 AM 01/08/2024    8:50 AM  Fall Risk  Falls in the past year? 0 0 0 0 0  Was there an injury with Fall? 0 0 0 0 0  Fall Risk  Category Calculator 0 0 0 0 0  Fall Risk Category (Retired) Low       Patient at Risk for Falls Due to No Fall Risks No Fall Risks No Fall Risks No Fall Risks No Fall Risks  Fall risk Follow up Falls evaluation completed  Falls evaluation completed Falls prevention discussed Falls evaluation completed Falls evaluation completed     Data saved with a previous flowsheet row definition    Functional Status Survey: Is the patient deaf or have difficulty hearing?: No Does the patient have difficulty seeing, even when wearing glasses/contacts?: No Does the patient have difficulty concentrating, remembering, or making decisions?: No Does the patient have difficulty walking or climbing stairs?: No Does the patient have difficulty dressing or bathing?: No Does the patient have difficulty doing errands alone such as visiting a doctor's office or shopping?: No    Past Medical History:  Past Medical History:  Diagnosis Date   Gallbladder attack    Hypertension    Lack of libido    PMB (postmenopausal bleeding)    Ringworm    Vitamin D  deficiency disease     Surgical History:  Past Surgical History:  Procedure Laterality Date   TOE SURGERY Right 05/2014   TONSILLECTOMY     TUBAL LIGATION      Medications:  Current Outpatient Medications on File Prior to Visit  Medication Sig   Cholecalciferol (VITAMIN D ) 50 MCG (2000 UT) CAPS Take 2,000 Units by mouth daily at 2 PM.   EPINEPHrine  (EPIPEN  2-PAK) 0.3 mg/0.3 mL IJ SOAJ injection Inject 0.3 mLs (0.3 mg total) into the muscle as needed for anaphylaxis.   No current facility-administered medications on file prior to visit.    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)   Bovine (Beef) Protein-Containing Drug Products    Porcine (Pork) Protein-Containing Drug Products     Social History:  Social History   Socioeconomic History    Marital status: Married    Spouse name: Not on file   Number of children: Not on file   Years of education: Not on file   Highest education level: Not on file  Occupational History   Not on file  Tobacco Use   Smoking status: Former    Current packs/day: 0.75    Average packs/day: 0.8 packs/day for 36.0 years (27.0 ttl pk-yrs)  Types: Cigarettes   Smokeless tobacco: Never   Tobacco comments:    patient stated she stopped smoking 2 months ago  Vaping Use   Vaping status: Never Used  Substance and Sexual Activity   Alcohol use: Not Currently    Comment: no alcohol in 2 years per patient   Drug use: No   Sexual activity: Yes    Birth control/protection: Surgical    Comment: tubal   Other Topics Concern   Not on file  Social History Narrative   Not on file   Social Drivers of Health   Financial Resource Strain: Low Risk  (01/08/2024)   Overall Financial Resource Strain (CARDIA)    Difficulty of Paying Living Expenses: Not hard at all  Food Insecurity: No Food Insecurity (01/08/2024)   Hunger Vital Sign    Worried About Running Out of Food in the Last Year: Never true    Ran Out of Food in the Last Year: Never true  Transportation Needs: No Transportation Needs (01/08/2024)   PRAPARE - Administrator, Civil Service (Medical): No    Lack of Transportation (Non-Medical): No  Physical Activity: Inactive (01/08/2024)   Exercise Vital Sign    Days of Exercise per Week: 0 days    Minutes of Exercise per Session: 0 min  Stress: No Stress Concern Present (01/08/2024)   [employer] of Occupational Health - Occupational Stress Questionnaire    Feeling of Stress: Not at all  Social Connections: Moderately Integrated (01/08/2024)   Social Connection and Isolation Panel    Frequency of Communication with Friends and Family: Three times a week    Frequency of Social Gatherings with Friends and Family: Three times a week    Attends Religious Services: More than 4  times per year    Active Member of Clubs or Organizations: No    Attends Banker Meetings: Never    Marital Status: Married  Catering Manager Violence: Not At Risk (01/08/2024)   Humiliation, Afraid, Rape, and Kick questionnaire    Fear of Current or Ex-Partner: No    Emotionally Abused: No    Physically Abused: No    Sexually Abused: No   Social History   Tobacco Use  Smoking Status Former   Current packs/day: 0.75   Average packs/day: 0.8 packs/day for 36.0 years (27.0 ttl pk-yrs)   Types: Cigarettes  Smokeless Tobacco Never  Tobacco Comments   patient stated she stopped smoking 2 months ago   Social History   Substance and Sexual Activity  Alcohol Use Not Currently   Comment: no alcohol in 2 years per patient    Family History:  Family History  Problem Relation Age of Onset   Hyperlipidemia Mother    Diabetes Mother    Heart disease Mother    Cancer Sister        leukemia   Diabetes Sister    Diabetes Maternal Grandmother    Hyperlipidemia Sister    Diabetes Sister    Stroke Sister    Breast cancer Neg Hx    Past medical history, surgical history, medications, allergies, family history and social history reviewed with patient today and changes made to appropriate areas of the chart.   ROS All other ROS negative except what is listed above and in the HPI.      Objective:    BP 128/82 (BP Location: Left Arm, Patient Position: Sitting)   Pulse 76   Temp 98.4 F (36.9 C) (Oral)  Resp 16   Ht 4' 11.49 (1.511 m)   Wt 166 lb (75.3 kg)   LMP  (LMP Unknown)   SpO2 98%   BMI 32.98 kg/m   Wt Readings from Last 3 Encounters:  01/08/24 166 lb (75.3 kg)  08/23/23 162 lb 6.4 oz (73.7 kg)  07/10/23 161 lb 12.8 oz (73.4 kg)    Physical Exam Vitals and nursing note reviewed. Exam conducted with a chaperone present.  Constitutional:      General: She is awake. She is not in acute distress.    Appearance: Normal appearance. She is well-developed  and well-groomed. She is not ill-appearing or toxic-appearing.  HENT:     Head: Normocephalic and atraumatic.     Right Ear: Hearing, tympanic membrane, ear canal and external ear normal. No drainage.     Left Ear: Hearing, tympanic membrane, ear canal and external ear normal. No drainage.     Nose: Nose normal.     Right Sinus: No maxillary sinus tenderness or frontal sinus tenderness.     Left Sinus: No maxillary sinus tenderness or frontal sinus tenderness.     Mouth/Throat:     Mouth: Mucous membranes are moist.     Pharynx: Oropharynx is clear. Uvula midline. No pharyngeal swelling, oropharyngeal exudate or posterior oropharyngeal erythema.  Eyes:     General: Lids are normal.        Right eye: No discharge.        Left eye: No discharge.     Extraocular Movements: Extraocular movements intact.     Conjunctiva/sclera: Conjunctivae normal.     Pupils: Pupils are equal, round, and reactive to light.     Visual Fields: Right eye visual fields normal and left eye visual fields normal.  Neck:     Thyroid : No thyromegaly.     Vascular: No carotid bruit.     Trachea: Trachea normal.  Cardiovascular:     Rate and Rhythm: Normal rate and regular rhythm.     Heart sounds: Normal heart sounds. No murmur heard.    No gallop.  Pulmonary:     Effort: Pulmonary effort is normal. No accessory muscle usage or respiratory distress.     Breath sounds: Normal breath sounds.  Chest:  Breasts:    Right: Normal.     Left: Normal.  Abdominal:     General: Bowel sounds are normal.     Palpations: Abdomen is soft. There is no hepatomegaly or splenomegaly.     Tenderness: There is no abdominal tenderness.  Musculoskeletal:        General: Normal range of motion.     Cervical back: Normal range of motion and neck supple.     Right lower leg: No edema.     Left lower leg: No edema.  Feet:     Right foot:     Protective Sensation: 10 sites tested.  10 sites sensed.     Skin integrity: Skin  integrity normal.     Left foot:     Protective Sensation: 10 sites tested.  10 sites sensed.     Skin integrity: Skin integrity normal.     Comments: Pes planus both feet Lymphadenopathy:     Head:     Right side of head: No submental, submandibular, tonsillar, preauricular or posterior auricular adenopathy.     Left side of head: No submental, submandibular, tonsillar, preauricular or posterior auricular adenopathy.     Cervical: No cervical adenopathy.     Upper  Body:     Right upper body: No supraclavicular, axillary or pectoral adenopathy.     Left upper body: No supraclavicular, axillary or pectoral adenopathy.  Skin:    General: Skin is warm and dry.     Capillary Refill: Capillary refill takes less than 2 seconds.     Findings: No rash.  Neurological:     Mental Status: She is alert and oriented to person, place, and time.     Gait: Gait is intact.     Deep Tendon Reflexes: Reflexes are normal and symmetric.     Reflex Scores:      Brachioradialis reflexes are 2+ on the right side and 2+ on the left side.      Patellar reflexes are 2+ on the right side and 2+ on the left side. Psychiatric:        Attention and Perception: Attention normal.        Mood and Affect: Mood normal.        Speech: Speech normal.        Behavior: Behavior normal. Behavior is cooperative.        Thought Content: Thought content normal.        Judgment: Judgment normal.    Results for orders placed or performed in visit on 07/10/23  Bayer DCA Hb A1c Waived   Collection Time: 07/10/23  8:41 AM  Result Value Ref Range   HB A1C (BAYER DCA - WAIVED) 5.9 (H) 4.8 - 5.6 %  Microalbumin, Urine Waived   Collection Time: 07/10/23  8:41 AM  Result Value Ref Range   Microalb, Ur Waived 30 (H) 0 - 19 mg/L   Creatinine, Urine Waived 100 10 - 300 mg/dL   Microalb/Creat Ratio <30 <30 mg/g  CBC with Differential/Platelet   Collection Time: 07/10/23  8:43 AM  Result Value Ref Range   WBC 6.8 3.4 - 10.8  x10E3/uL   RBC 4.45 3.77 - 5.28 x10E6/uL   Hemoglobin 12.2 11.1 - 15.9 g/dL   Hematocrit 60.3 65.9 - 46.6 %   MCV 89 79 - 97 fL   MCH 27.4 26.6 - 33.0 pg   MCHC 30.8 (L) 31.5 - 35.7 g/dL   RDW 86.9 88.2 - 84.5 %   Platelets 258 150 - 450 x10E3/uL   Neutrophils 63 Not Estab. %   Lymphs 27 Not Estab. %   Monocytes 7 Not Estab. %   Eos 2 Not Estab. %   Basos 1 Not Estab. %   Neutrophils Absolute 4.3 1.4 - 7.0 x10E3/uL   Lymphocytes Absolute 1.8 0.7 - 3.1 x10E3/uL   Monocytes Absolute 0.4 0.1 - 0.9 x10E3/uL   EOS (ABSOLUTE) 0.2 0.0 - 0.4 x10E3/uL   Basophils Absolute 0.0 0.0 - 0.2 x10E3/uL   Immature Granulocytes 0 Not Estab. %   Immature Grans (Abs) 0.0 0.0 - 0.1 x10E3/uL  Comprehensive metabolic panel with GFR   Collection Time: 07/10/23  8:43 AM  Result Value Ref Range   Glucose 103 (H) 70 - 99 mg/dL   BUN 8 8 - 27 mg/dL   Creatinine, Ser 9.13 0.57 - 1.00 mg/dL   eGFR 77 >40 fO/fpw/8.26   BUN/Creatinine Ratio 9 (L) 12 - 28   Sodium 140 134 - 144 mmol/L   Potassium 4.7 3.5 - 5.2 mmol/L   Chloride 101 96 - 106 mmol/L   CO2 21 20 - 29 mmol/L   Calcium  10.1 8.7 - 10.3 mg/dL   Total Protein 7.5 6.0 - 8.5 g/dL  Albumin 4.7 3.9 - 4.9 g/dL   Globulin, Total 2.8 1.5 - 4.5 g/dL   Bilirubin Total 0.6 0.0 - 1.2 mg/dL   Alkaline Phosphatase 100 44 - 121 IU/L   AST 23 0 - 40 IU/L   ALT 19 0 - 32 IU/L  Lipid Panel w/o Chol/HDL Ratio   Collection Time: 07/10/23  8:43 AM  Result Value Ref Range   Cholesterol, Total 125 100 - 199 mg/dL   Triglycerides 894 0 - 149 mg/dL   HDL 47 >60 mg/dL   VLDL Cholesterol Cal 19 5 - 40 mg/dL   LDL Chol Calc (NIH) 59 0 - 99 mg/dL  Vitamin D  (25 hydroxy)   Collection Time: 07/10/23  8:43 AM  Result Value Ref Range   Vit D, 25-Hydroxy 38.2 30.0 - 100.0 ng/mL      Assessment & Plan:   Problem List Items Addressed This Visit       Cardiovascular and Mediastinum   Hypertension (Chronic)   Chronic, stable.  Remains stable without medication  since stopped smoking and alcohol use.  Praised for this.  Continue diet focus at home.  Recommend she monitor BP at least a few mornings a week at home and document.  DASH diet at home.  Labs today: CMP, CBC, TSH.  Urine ALB 21 Jul 2023.  Return in 6 months.       Relevant Medications   rosuvastatin  (CRESTOR ) 10 MG tablet   Other Relevant Orders   CBC with Differential/Platelet   Comprehensive metabolic panel with GFR   TSH     Respiratory   Centrilobular emphysema (HCC) - Primary (Chronic)   Chronic, ongoing.  Quit smoking > 4 years ago, praised for continued cessation.  At this time no inhaler regimen and overall minimal symptoms.  Will plan on spirometry next visit and recommend to continue yearly lung screening until age 52.  Return in 6 months.      Relevant Orders   CBC with Differential/Platelet     Digestive   Fatty liver   Ongoing on u/s noted.  Recommend continued cessation of alcohol intake and focus on health diet at home, reducing processed foods.  Recheck labs today.      Relevant Orders   Comprehensive metabolic panel with GFR     Other   Mixed hyperlipidemia (Chronic)   Chronic, ongoing.  Recommend she continue Rosuvastatin  10 MG daily and if joint pain may change to weekly dosing. The ASCVD Risk score (Arnett DK, et al., 2019) failed to calculate for the following reasons:   The valid total cholesterol range is 130 to 320 mg/dL       Relevant Medications   rosuvastatin  (CRESTOR ) 10 MG tablet   Other Relevant Orders   Comprehensive metabolic panel with GFR   Lipid Panel w/o Chol/HDL Ratio   Elevated hemoglobin A1c measurement (Chronic)   Ongoing.  A1c 5.9% last check, recheck today.  Recommend heavy focus on diet and exercise changes.  Discussed at length with patient. Urine ALB 21 Jul 2023.      Relevant Orders   Bayer DCA Hb A1c Waived   Vitamin D  deficiency   Chronic, ongoing.  Continue supplement at home and recheck level today.      Relevant  Orders   VITAMIN D  25 Hydroxy (Vit-D Deficiency, Fractures)   Pes planus of both feet   To both feet, suspect this is causing the intermittent pain she gets. Currently affecting right foot, in past was left. ?nerve  entrapment. Recommend she look into orthotics to help lift arch and do exercises at home for flat feet. Educated her on this.      Obesity   BMI 32.98.  Recommended eating smaller high protein, low fat meals more frequently and exercising 30 mins a day 5 times a week with a goal of 10-15lb weight loss in the next 3 months. Patient voiced their understanding and motivation to adhere to these recommendations.       Other Visit Diagnoses       Flu vaccine need       Flu vaccine today, educated patient.   Relevant Orders   Flu vaccine trivalent PF, 6mos and older(Flulaval,Afluria,Fluarix,Fluzone) (Completed)     Pneumococcal vaccination given       PCV20 today, educated patient.   Relevant Orders   Pneumococcal conjugate vaccine 20-valent (Completed)     Colon cancer screening       Cologuard ordered for screening.   Relevant Orders   Cologuard     Encounter for annual physical exam       Annual physical today with labs and health maintenance reviewed, discussed with patient.        Follow up plan: Return in about 6 months (around 07/07/2024) for HTN/HLD, COPD, IFG.   LABORATORY TESTING:  - Pap smear: refuses -- does not want to do anymore  IMMUNIZATIONS:   - Tdap: Tetanus vaccination status reviewed: Td vaccination indicated and given today. - Influenza: Up To Date - Pneumovax: Up to date - Prevnar: Up To Date - COVID: Up to date  - HPV: Not applicable - Shingrix vaccine: Refused  SCREENING: -Mammogram: To schedule - Colonoscopy: Up to date -- Cologuard sent in - Bone Density: Not applicable  -Hearing Test: Not applicable  -Spirometry: Not applicable   PATIENT COUNSELING:   Advised to take 1 mg of folate supplement per day if capable of pregnancy.    Sexuality: Discussed sexually transmitted diseases, partner selection, use of condoms, avoidance of unintended pregnancy  and contraceptive alternatives.   Advised to avoid cigarette smoking.  I discussed with the patient that most people either abstain from alcohol or drink within safe limits (<=14/week and <=4 drinks/occasion for males, <=7/weeks and <= 3 drinks/occasion for females) and that the risk for alcohol disorders and other health effects rises proportionally with the number of drinks per week and how often a drinker exceeds daily limits.  Discussed cessation/primary prevention of drug use and availability of treatment for abuse.   Diet: Encouraged to adjust caloric intake to maintain  or achieve ideal body weight, to reduce intake of dietary saturated fat and total fat, to limit sodium intake by avoiding high sodium foods and not adding table salt, and to maintain adequate dietary potassium and calcium  preferably from fresh fruits, vegetables, and low-fat dairy products.    Stressed the importance of regular exercise  Injury prevention: Discussed safety belts, safety helmets, smoke detector, smoking near bedding or upholstery.   Dental health: Discussed importance of regular tooth brushing, flossing, and dental visits.    NEXT PREVENTATIVE PHYSICAL DUE IN 1 YEAR. Return in about 6 months (around 07/07/2024) for HTN/HLD, COPD, IFG.

## 2024-01-08 NOTE — Assessment & Plan Note (Addendum)
 Chronic, ongoing.  Recommend she continue Rosuvastatin  10 MG daily and if joint pain may change to weekly dosing. The ASCVD Risk score (Arnett DK, et al., 2019) failed to calculate for the following reasons:   The valid total cholesterol range is 130 to 320 mg/dL

## 2024-01-08 NOTE — Assessment & Plan Note (Signed)
 To both feet, suspect this is causing the intermittent pain she gets. Currently affecting right foot, in past was left. ?nerve entrapment. Recommend she look into orthotics to help lift arch and do exercises at home for flat feet. Educated her on this.

## 2024-01-08 NOTE — Assessment & Plan Note (Signed)
 Ongoing.  A1c 5.9% last check, recheck today.  Recommend heavy focus on diet and exercise changes.  Discussed at length with patient. Urine ALB 21 Jul 2023.

## 2024-01-09 ENCOUNTER — Ambulatory Visit: Payer: Self-pay | Admitting: Nurse Practitioner

## 2024-01-09 LAB — CBC WITH DIFFERENTIAL/PLATELET
Basophils Absolute: 0 x10E3/uL (ref 0.0–0.2)
Basos: 1 %
EOS (ABSOLUTE): 0.1 x10E3/uL (ref 0.0–0.4)
Eos: 2 %
Hematocrit: 40.6 % (ref 34.0–46.6)
Hemoglobin: 12.7 g/dL (ref 11.1–15.9)
Immature Grans (Abs): 0 x10E3/uL (ref 0.0–0.1)
Immature Granulocytes: 0 %
Lymphocytes Absolute: 2.2 x10E3/uL (ref 0.7–3.1)
Lymphs: 34 %
MCH: 27.4 pg (ref 26.6–33.0)
MCHC: 31.3 g/dL — ABNORMAL LOW (ref 31.5–35.7)
MCV: 88 fL (ref 79–97)
Monocytes Absolute: 0.3 x10E3/uL (ref 0.1–0.9)
Monocytes: 5 %
Neutrophils Absolute: 3.7 x10E3/uL (ref 1.4–7.0)
Neutrophils: 58 %
Platelets: 270 x10E3/uL (ref 150–450)
RBC: 4.64 x10E6/uL (ref 3.77–5.28)
RDW: 13.2 % (ref 11.7–15.4)
WBC: 6.4 x10E3/uL (ref 3.4–10.8)

## 2024-01-09 LAB — COMPREHENSIVE METABOLIC PANEL WITH GFR
ALT: 16 IU/L (ref 0–32)
AST: 19 IU/L (ref 0–40)
Albumin: 4.8 g/dL (ref 3.9–4.9)
Alkaline Phosphatase: 99 IU/L (ref 49–135)
BUN/Creatinine Ratio: 7 — ABNORMAL LOW (ref 12–28)
BUN: 7 mg/dL — ABNORMAL LOW (ref 8–27)
Bilirubin Total: 0.4 mg/dL (ref 0.0–1.2)
CO2: 20 mmol/L (ref 20–29)
Calcium: 10.2 mg/dL (ref 8.7–10.3)
Chloride: 106 mmol/L (ref 96–106)
Creatinine, Ser: 0.95 mg/dL (ref 0.57–1.00)
Globulin, Total: 2.8 g/dL (ref 1.5–4.5)
Glucose: 109 mg/dL — ABNORMAL HIGH (ref 70–99)
Potassium: 4.1 mmol/L (ref 3.5–5.2)
Sodium: 141 mmol/L (ref 134–144)
Total Protein: 7.6 g/dL (ref 6.0–8.5)
eGFR: 68 mL/min/1.73 (ref >59)

## 2024-01-09 LAB — VITAMIN D 25 HYDROXY (VIT D DEFICIENCY, FRACTURES): Vit D, 25-Hydroxy: 40.1 ng/mL (ref 30.0–100.0)

## 2024-01-09 LAB — LIPID PANEL W/O CHOL/HDL RATIO
Cholesterol, Total: 133 mg/dL (ref 100–199)
HDL: 55 mg/dL (ref >39)
LDL Chol Calc (NIH): 61 mg/dL (ref 0–99)
Triglycerides: 90 mg/dL (ref 0–149)
VLDL Cholesterol Cal: 17 mg/dL (ref 5–40)

## 2024-01-09 LAB — TSH: TSH: 0.725 u[IU]/mL (ref 0.450–4.500)

## 2024-01-09 NOTE — Progress Notes (Signed)
 Contacted via MyChart  Good morning Deanna Velez, your labs have returned and overall are stable: - Kidney function, creatinine and eGFR, remains normal, as is liver function, AST and ALT.  - CBC no anemia or infection. - Lipid panel shows stable levels with statin therapy on board. - Remainder of labs stable. A1c continues to show prediabetes at 5.7%, continue diet focus. Any questions? Keep being amazing!!  Thank you for allowing me to participate in your care.  I appreciate you. Kindest regards, Zohal Reny

## 2024-02-05 LAB — COLOGUARD: COLOGUARD: NEGATIVE

## 2024-02-06 NOTE — Progress Notes (Signed)
 Contacted via MyChart  Cologuard negative. Repeat in 3 years. Great news!!

## 2024-03-12 ENCOUNTER — Ambulatory Visit: Payer: Self-pay

## 2024-03-12 NOTE — Telephone Encounter (Signed)
 Scheduled patient with DOROTHA Benders, NP for 03/13/2024 @ 3:00 pm. She explains she was sick a few weeks ago and this started on Sunday. She feels its very similar to prior when she had walking pneumonia.

## 2024-03-12 NOTE — Telephone Encounter (Signed)
 FYI Only or Action Required?: Action required by provider: request for appointment.  Patient was last seen in primary care on 01/08/2024 by Valerio Melanie DASEN, NP.  Called Nurse Triage reporting No chief complaint on file..  Symptoms began 3 days ago.  Interventions attempted: Nothing.  Symptoms are: gradually worsening.  Triage Disposition: No disposition on file.  Patient/caregiver understands and will follow disposition?:   Reason for Triage:  having a pain - day 3 and was told to make an appt as soon as possible.   Left lung. trouble breathing - last night coughin dry cough.  Reason for Disposition  [1] Chest pain lasts > 5 minutes AND [2] age > 42  Answer Assessment - Initial Assessment Questions Pt was ill 1.5 wks ago, symptoms resolved, started with CP 3 days ago. Cough returned last night.  Declines ED, wants to be seen in office.  [facility] notified  1. LOCATION: Where does it hurt?       Left chest 2. RADIATION: Does the pain go anywhere else? (e.g., into neck, jaw, arms, back)     Radiates to back when coughing 3. ONSET: When did the chest pain begin? (Minutes, hours or days)      3 days ago 4. PATTERN: Does the pain come and go, or has it been constant since it started?  Does it get worse with exertion?      constant 5. DURATION: How long does it last (e.g., seconds, minutes, hours)     constant 6. SEVERITY: How bad is the pain?  (e.g., Scale 1-10; mild, moderate, or severe)     moderate 7. CARDIAC RISK FACTORS: Do you have any history of heart problems or risk factors for heart disease? (e.g., angina, prior heart attack; diabetes, high blood pressure, high cholesterol, smoker, or strong family history of heart disease)      8. PULMONARY RISK FACTORS: Do you have any history of lung disease?  (e.g., blood clots in lung, asthma, emphysema, birth control pills)      9. CAUSE: What do you think is causing the chest pain?      10. OTHER SYMPTOMS: Do  you have any other symptoms? (e.g., dizziness, nausea, vomiting, sweating, fever, difficulty breathing, cough)       Mild SOB, increasing pain with deep breath 11. PREGNANCY: Is there any chance you are pregnant? When was your last menstrual period?  Protocols used: Chest Pain-A-AH

## 2024-03-13 ENCOUNTER — Ambulatory Visit

## 2024-03-13 ENCOUNTER — Ambulatory Visit
Admission: RE | Admit: 2024-03-13 | Discharge: 2024-03-13 | Disposition: A | Discharge status: Home / Self Care | Source: Ambulatory Visit

## 2024-03-13 VITALS — BP 129/78 | HR 73 | Temp 98.3°F | Resp 14 | Ht 59.49 in | Wt 166.6 lb

## 2024-03-13 DIAGNOSIS — R0789 Other chest pain: Secondary | ICD-10-CM | POA: Diagnosis not present

## 2024-03-13 DIAGNOSIS — R059 Cough, unspecified: Secondary | ICD-10-CM | POA: Insufficient documentation

## 2024-03-13 NOTE — Progress Notes (Signed)
 "  BP 129/78 (BP Location: Right Arm, Patient Position: Sitting, Cuff Size: Large)   Pulse 73   Temp 98.3 F (36.8 C) (Oral)   Resp 14   Ht 4' 11.49 (1.511 m)   Wt 166 lb 9.6 oz (75.6 kg)   LMP  (LMP Unknown)   SpO2 97%   BMI 33.10 kg/m    Subjective:    Patient ID: Deanna Velez, female    DOB: March 17, 1960, 64 y.o.   MRN: [id number]  HPI: Layali Freund is a 64 y.o. female c/o deep sharp pain on left side back and states it shoots through chest to her back x3 days.  She feels the pain with raising her arms and pressing on her back.  She also reports feeling it worse when she walks upstairs/is more active.  She has a mild, infrequent cough.  She denies fever, chills, n/v/d.  She was sick around 3-4 weeks ago but that only lasted 3 days and she recovered afterwards. Denies hemoptysis, palpitations, skin pain. No hx CA, DVT or PE.    Chief Complaint  Patient presents with   New cough post sickness    Had been sick at Christmas time but this Sunday started having left lung pain and almost feels like a bruise.     Relevant past medical, surgical, family and social history reviewed and updated as indicated. Interim medical history since our last visit reviewed. Allergies and medications reviewed and updated.  Review of Systems  Constitutional:  Negative for activity change, appetite change, chills, fatigue and fever.  HENT:  Negative for congestion, sinus pressure, sinus pain, sneezing, sore throat and trouble swallowing.   Respiratory:  Positive for cough, chest tightness and shortness of breath. Negative for wheezing.   Cardiovascular:  Positive for chest pain. Negative for palpitations and leg swelling.  Gastrointestinal:  Negative for abdominal pain.  Musculoskeletal:  Positive for back pain. Negative for myalgias and neck pain.  Neurological:  Negative for dizziness, syncope, light-headedness and numbness.    Per HPI unless specifically indicated above     Objective:    BP 129/78  (BP Location: Right Arm, Patient Position: Sitting, Cuff Size: Large)   Pulse 73   Temp 98.3 F (36.8 C) (Oral)   Resp 14   Ht 4' 11.49 (1.511 m)   Wt 166 lb 9.6 oz (75.6 kg)   LMP  (LMP Unknown)   SpO2 97%   BMI 33.10 kg/m   Wt Readings from Last 3 Encounters:  03/13/24 166 lb 9.6 oz (75.6 kg)  01/08/24 166 lb (75.3 kg)  08/23/23 162 lb 6.4 oz (73.7 kg)    Physical Exam Constitutional:      Appearance: Normal appearance. She is obese.  HENT:     Right Ear: Tympanic membrane normal.     Left Ear: Tympanic membrane normal.     Nose: No congestion or rhinorrhea.     Mouth/Throat:     Mouth: Mucous membranes are moist.     Pharynx: Oropharynx is clear.  Cardiovascular:     Rate and Rhythm: Normal rate and regular rhythm.     Heart sounds: No murmur heard.    No friction rub.  Pulmonary:     Effort: Pulmonary effort is normal. No respiratory distress.     Breath sounds: Examination of the right-upper field reveals wheezing. Examination of the left-upper field reveals wheezing. Examination of the right-middle field reveals wheezing. Examination of the left-middle field reveals wheezing. Wheezing present. No rhonchi or  rales.  Abdominal:     General: Abdomen is flat. There is no distension.     Palpations: Abdomen is soft. There is no mass.     Tenderness: There is no abdominal tenderness.     Hernia: No hernia is present.  Musculoskeletal:     Cervical back: Normal range of motion and neck supple.  Skin:    General: Skin is warm and dry.  Neurological:     General: No focal deficit present.     Mental Status: She is alert and oriented to person, place, and time.  Psychiatric:        Mood and Affect: Mood normal.        Behavior: Behavior normal.     Results for orders placed or performed in visit on 01/08/24  Bayer DCA Hb A1c Waived   Collection Time: 01/08/24  9:22 AM  Result Value Ref Range   HB A1C (BAYER DCA - WAIVED) 5.7 (H) 4.8 - 5.6 %  CBC with  Differential/Platelet   Collection Time: 01/08/24  9:26 AM  Result Value Ref Range   WBC 6.4 3.4 - 10.8 x10E3/uL   RBC 4.64 3.77 - 5.28 x10E6/uL   Hemoglobin 12.7 11.1 - 15.9 g/dL   Hematocrit 59.3 65.9 - 46.6 %   MCV 88 79 - 97 fL   MCH 27.4 26.6 - 33.0 pg   MCHC 31.3 (L) 31.5 - 35.7 g/dL   RDW 86.7 88.2 - 84.5 %   Platelets 270 150 - 450 x10E3/uL   Neutrophils 58 Not Estab. %   Lymphs 34 Not Estab. %   Monocytes 5 Not Estab. %   Eos 2 Not Estab. %   Basos 1 Not Estab. %   Neutrophils Absolute 3.7 1.4 - 7.0 x10E3/uL   Lymphocytes Absolute 2.2 0.7 - 3.1 x10E3/uL   Monocytes Absolute 0.3 0.1 - 0.9 x10E3/uL   EOS (ABSOLUTE) 0.1 0.0 - 0.4 x10E3/uL   Basophils Absolute 0.0 0.0 - 0.2 x10E3/uL   Immature Granulocytes 0 Not Estab. %   Immature Grans (Abs) 0.0 0.0 - 0.1 x10E3/uL  Comprehensive metabolic panel with GFR   Collection Time: 01/08/24  9:26 AM  Result Value Ref Range   Glucose 109 (H) 70 - 99 mg/dL   BUN 7 (L) 8 - 27 mg/dL   Creatinine, Ser 9.04 0.57 - 1.00 mg/dL   eGFR 68 >40 fO/fpw/8.26   BUN/Creatinine Ratio 7 (L) 12 - 28   Sodium 141 134 - 144 mmol/L   Potassium 4.1 3.5 - 5.2 mmol/L   Chloride 106 96 - 106 mmol/L   CO2 20 20 - 29 mmol/L   Calcium  10.2 8.7 - 10.3 mg/dL   Total Protein 7.6 6.0 - 8.5 g/dL   Albumin 4.8 3.9 - 4.9 g/dL   Globulin, Total 2.8 1.5 - 4.5 g/dL   Bilirubin Total 0.4 0.0 - 1.2 mg/dL   Alkaline Phosphatase 99 49 - 135 IU/L   AST 19 0 - 40 IU/L   ALT 16 0 - 32 IU/L  Lipid Panel w/o Chol/HDL Ratio   Collection Time: 01/08/24  9:26 AM  Result Value Ref Range   Cholesterol, Total 133 100 - 199 mg/dL   Triglycerides 90 0 - 149 mg/dL   HDL 55 >60 mg/dL   VLDL Cholesterol Cal 17 5 - 40 mg/dL   LDL Chol Calc (NIH) 61 0 - 99 mg/dL  TSH   Collection Time: 01/08/24  9:26 AM  Result Value Ref Range  TSH 0.725 0.450 - 4.500 uIU/mL  VITAMIN D  25 Hydroxy (Vit-D Deficiency, Fractures)   Collection Time: 01/08/24  9:26 AM  Result Value Ref Range    Vit D, 25-Hydroxy 40.1 30.0 - 100.0 ng/mL  Cologuard   Collection Time: 01/30/24  5:30 AM  Result Value Ref Range   COLOGUARD Negative Negative      Assessment & Plan:   Assessment & Plan Chest wall tenderness Reproducible with palpation and arm movement, but also reports pain with increased movement and some radiation.  SpO2 97%, hr 73bpm, nonlabored, no hemoptysis.  Will order d-dimer to r/o PE.  Present to ED with worsening CP, otherwise plan to follow up in 1 week.  Orders:   EKG 12-Lead   D-Dimer, Quantitative  Cough, unspecified type Minimal wheezing auscultated.  Will order CXR to r/o pneumonia. Orders:   DG Chest 2 View; Future   Follow up plan: Return in about 1 week (around 03/20/2024) for lung recheck.      "

## 2024-03-14 LAB — D-DIMER, QUANTITATIVE: D-DIMER: <0.2 mg{FEU}/L (ref 0.00–0.49)

## 2024-03-15 ENCOUNTER — Ambulatory Visit: Admitting: Nurse Practitioner

## 2024-03-16 NOTE — Patient Instructions (Incomplete)
 Be Involved in Caring For Your Health:  Taking Medications When medications are taken as directed, they can greatly improve your health. But if they are not taken as prescribed, they may not work. In some cases, not taking them correctly can be harmful. To help ensure your treatment remains effective and safe, understand your medications and how to take them. Bring your medications to each visit for review by your provider.  Your lab results, notes, and after visit summary will be available on My Chart. We strongly encourage you to use this feature. If lab results are abnormal the clinic will contact you with the appropriate steps. If the clinic does not contact you assume the results are satisfactory. You can always view your results on My Chart. If you have questions regarding your health or results, please contact the clinic during office hours. You can also ask questions on My Chart.  We at [facility] are grateful that you chose us  to provide your care. We strive to provide evidence-based and compassionate care and are always looking for feedback. If you get a survey from the clinic please complete this so we can hear your opinions.   Chest Wall Pain Chest wall pain is pain in or around the bones and muscles of your chest. Sometimes, an injury causes this pain. Excessive coughing or overuse of arm and chest muscles may also cause chest wall pain. Sometimes, the cause may not be known. This pain may take several weeks or longer to get better. Follow these instructions at home: Managing pain, stiffness, and swelling  If directed, put ice on the painful area: Put ice in a plastic bag. Place a towel between your skin and the bag. Leave the ice on for 20 minutes, 2-3 times per day. Activity Rest as told by your health care provider. Avoid activities that cause pain. These include any activities that use your chest muscles or your abdominal and side muscles to lift heavy items. Ask  your health care provider what activities are safe for you. General instructions  Take over-the-counter and prescription medicines only as told by your health care provider. Do not use any products that contain nicotine  or tobacco, such as cigarettes, e-cigarettes, and chewing tobacco. These can delay healing after injury. If you need help quitting, ask your health care provider. Keep all follow-up visits as told by your health care provider. This is important. Contact a health care provider if: You have a fever. Your chest pain becomes worse. You have new symptoms. Get help right away if: You have nausea or vomiting. You feel sweaty or light-headed. You have a cough with mucus from your lungs (sputum) or you cough up blood. You develop shortness of breath. These symptoms may represent a serious problem that is an emergency. Do not wait to see if the symptoms will go away. Get medical help right away. Call your local emergency services (911 in the [place] Do not drive yourself to the hospital. Summary Chest wall pain is pain in or around the bones and muscles of your chest. Depending on the cause, it may be treated with ice, rest, medicines, and avoiding activities that cause pain. Contact a health care provider if you have a fever, worsening chest pain, or new symptoms. Get help right away if you feel light-headed or you develop shortness of breath. These symptoms may be an emergency. This information is not intended to replace advice given to you by your health care provider. Make sure you discuss  any questions you have with your health care provider. Document Revised: 01/31/2022 Document Reviewed: 01/31/2022 Elsevier Patient Education  2024 [employer].

## 2024-03-20 ENCOUNTER — Ambulatory Visit

## 2024-03-20 ENCOUNTER — Ambulatory Visit: Payer: Self-pay

## 2024-03-22 ENCOUNTER — Ambulatory Visit: Admitting: Nurse Practitioner

## 2024-07-11 ENCOUNTER — Ambulatory Visit: Admitting: Nurse Practitioner
